# Patient Record
Sex: Male | Born: 1950 | Race: White | Hispanic: No | Smoking: Never smoker
Health system: Southern US, Community
[De-identification: ages and names within clinical notes are randomized; demographics above are authoritative.]

## PROBLEM LIST (undated history)

## (undated) DIAGNOSIS — R7989 Other specified abnormal findings of blood chemistry: Secondary | ICD-10-CM

## (undated) DIAGNOSIS — G4730 Sleep apnea, unspecified: Secondary | ICD-10-CM

## (undated) DIAGNOSIS — Z9889 Other specified postprocedural states: Secondary | ICD-10-CM

## (undated) DIAGNOSIS — R112 Nausea with vomiting, unspecified: Secondary | ICD-10-CM

## (undated) DIAGNOSIS — J40 Bronchitis, not specified as acute or chronic: Secondary | ICD-10-CM

## (undated) DIAGNOSIS — E119 Type 2 diabetes mellitus without complications: Secondary | ICD-10-CM

## (undated) DIAGNOSIS — R251 Tremor, unspecified: Secondary | ICD-10-CM

## (undated) DIAGNOSIS — E785 Hyperlipidemia, unspecified: Secondary | ICD-10-CM

## (undated) DIAGNOSIS — J449 Chronic obstructive pulmonary disease, unspecified: Secondary | ICD-10-CM

## (undated) DIAGNOSIS — I10 Essential (primary) hypertension: Secondary | ICD-10-CM

## (undated) DIAGNOSIS — I4891 Unspecified atrial fibrillation: Secondary | ICD-10-CM

## (undated) HISTORY — DX: Hyperlipidemia, unspecified: E78.5

## (undated) HISTORY — DX: Tremor, unspecified: R25.1

## (undated) HISTORY — DX: Morbid (severe) obesity due to excess calories: E66.01

## (undated) HISTORY — PX: HERNIA REPAIR: SHX51

## (undated) HISTORY — PX: JOINT REPLACEMENT: SHX530

## (undated) HISTORY — DX: Unspecified atrial fibrillation: I48.91

## (undated) HISTORY — PX: HYDROCELE EXCISION / REPAIR: SUR1145

## (undated) HISTORY — PX: ROTATOR CUFF REPAIR: SHX139

---

## 2003-04-27 ENCOUNTER — Encounter: Admission: RE | Admit: 2003-04-27 | Discharge: 2003-07-26 | Payer: Self-pay | Admitting: Internal Medicine

## 2005-12-14 ENCOUNTER — Emergency Department (HOSPITAL_COMMUNITY): Admission: EM | Admit: 2005-12-14 | Discharge: 2005-12-14 | Payer: Self-pay | Admitting: Emergency Medicine

## 2007-10-15 ENCOUNTER — Encounter: Admission: RE | Admit: 2007-10-15 | Discharge: 2007-10-15 | Payer: Self-pay | Admitting: Internal Medicine

## 2007-10-31 ENCOUNTER — Ambulatory Visit (HOSPITAL_COMMUNITY): Admission: RE | Admit: 2007-10-31 | Discharge: 2007-10-31 | Payer: Self-pay | Admitting: Cardiovascular Disease

## 2007-12-20 ENCOUNTER — Emergency Department (HOSPITAL_COMMUNITY): Admission: EM | Admit: 2007-12-20 | Discharge: 2007-12-20 | Payer: Self-pay | Admitting: General Surgery

## 2008-07-09 ENCOUNTER — Ambulatory Visit (HOSPITAL_COMMUNITY): Admission: RE | Admit: 2008-07-09 | Discharge: 2008-07-10 | Payer: Self-pay | Admitting: Orthopedic Surgery

## 2008-12-18 ENCOUNTER — Encounter: Admission: RE | Admit: 2008-12-18 | Discharge: 2008-12-18 | Payer: Self-pay | Admitting: Internal Medicine

## 2009-02-24 ENCOUNTER — Emergency Department (HOSPITAL_COMMUNITY): Admission: EM | Admit: 2009-02-24 | Discharge: 2009-02-24 | Payer: Self-pay | Admitting: Emergency Medicine

## 2009-11-02 ENCOUNTER — Inpatient Hospital Stay (HOSPITAL_COMMUNITY): Admission: RE | Admit: 2009-11-02 | Discharge: 2009-11-04 | Payer: Self-pay | Admitting: Orthopedic Surgery

## 2009-12-03 ENCOUNTER — Ambulatory Visit: Payer: Self-pay | Admitting: Vascular Surgery

## 2009-12-03 ENCOUNTER — Ambulatory Visit: Admission: RE | Admit: 2009-12-03 | Discharge: 2009-12-03 | Payer: Self-pay | Admitting: Orthopedic Surgery

## 2009-12-03 ENCOUNTER — Encounter (INDEPENDENT_AMBULATORY_CARE_PROVIDER_SITE_OTHER): Payer: Self-pay | Admitting: Orthopedic Surgery

## 2010-06-10 LAB — BASIC METABOLIC PANEL
BUN: 13 mg/dL (ref 6–23)
BUN: 18 mg/dL (ref 6–23)
Calcium: 8.7 mg/dL (ref 8.4–10.5)
Chloride: 102 mEq/L (ref 96–112)
GFR calc non Af Amer: >60 mL/min (ref >60)
GFR calc non Af Amer: >60 mL/min (ref >60)
Glucose, Bld: 130 mg/dL — ABNORMAL HIGH (ref 70–99)
Glucose, Bld: 148 mg/dL — ABNORMAL HIGH (ref 70–99)
Potassium: 4.5 mEq/L (ref 3.5–5.1)
Sodium: 133 mEq/L — ABNORMAL LOW (ref 135–145)
Sodium: 137 mEq/L (ref 135–145)

## 2010-06-10 LAB — GLUCOSE, CAPILLARY
Glucose-Capillary: 123 mg/dL — ABNORMAL HIGH (ref 70–99)
Glucose-Capillary: 125 mg/dL — ABNORMAL HIGH (ref 70–99)
Glucose-Capillary: 145 mg/dL — ABNORMAL HIGH (ref 70–99)
Glucose-Capillary: 151 mg/dL — ABNORMAL HIGH (ref 70–99)
Glucose-Capillary: 175 mg/dL — ABNORMAL HIGH (ref 70–99)

## 2010-06-10 LAB — PROTIME-INR
INR: 1.03 (ref 0.00–1.49)
INR: 1.18 (ref 0.00–1.49)
Prothrombin Time: 13.7 seconds (ref 11.6–15.2)

## 2010-06-10 LAB — CBC
HCT: 32.5 % — ABNORMAL LOW (ref 39.0–52.0)
Hemoglobin: 11.1 g/dL — ABNORMAL LOW (ref 13.0–17.0)
MCHC: 34.1 g/dL (ref 30.0–36.0)
MCHC: 34.5 g/dL (ref 30.0–36.0)
MCV: 89 fL (ref 78.0–100.0)
Platelets: 122 10*3/uL — ABNORMAL LOW (ref 150–400)
RDW: 13.2 % (ref 11.5–15.5)
WBC: 11.1 10*3/uL — ABNORMAL HIGH (ref 4.0–10.5)

## 2010-06-10 LAB — TYPE AND SCREEN: Antibody Screen: NEGATIVE

## 2010-06-10 LAB — ABO/RH: ABO/RH(D): O POS

## 2010-06-11 LAB — DIFFERENTIAL
Basophils Relative: 0 % (ref 0–1)
Eosinophils Absolute: 0.2 10*3/uL (ref 0.0–0.7)
Lymphs Abs: 1.6 10*3/uL (ref 0.7–4.0)
Monocytes Absolute: 0.6 10*3/uL (ref 0.1–1.0)
Monocytes Relative: 8 % (ref 3–12)

## 2010-06-11 LAB — URINALYSIS, ROUTINE W REFLEX MICROSCOPIC
Bilirubin Urine: NEGATIVE
Glucose, UA: NEGATIVE mg/dL
Hgb urine dipstick: NEGATIVE
Specific Gravity, Urine: 1.015 (ref 1.005–1.030)
Urobilinogen, UA: 0.2 mg/dL (ref 0.0–1.0)
pH: 5.5 (ref 5.0–8.0)

## 2010-06-11 LAB — BASIC METABOLIC PANEL
CO2: 27 mEq/L (ref 19–32)
Chloride: 108 mEq/L (ref 96–112)
Glucose, Bld: 116 mg/dL — ABNORMAL HIGH (ref 70–99)
Potassium: 4.4 mEq/L (ref 3.5–5.1)
Sodium: 139 mEq/L (ref 135–145)

## 2010-06-11 LAB — CBC
HCT: 41.8 % (ref 39.0–52.0)
Hemoglobin: 14.3 g/dL (ref 13.0–17.0)
MCH: 30.2 pg (ref 26.0–34.0)
MCHC: 34.2 g/dL (ref 30.0–36.0)
MCV: 88.4 fL (ref 78.0–100.0)
RBC: 4.73 MIL/uL (ref 4.22–5.81)

## 2010-06-11 LAB — SURGICAL PCR SCREEN: Staphylococcus aureus: NEGATIVE

## 2010-07-06 LAB — GLUCOSE, CAPILLARY
Glucose-Capillary: 148 mg/dL — ABNORMAL HIGH (ref 70–99)
Glucose-Capillary: 198 mg/dL — ABNORMAL HIGH (ref 70–99)

## 2010-07-06 LAB — CBC
HCT: 39.1 % (ref 39.0–52.0)
HCT: 46.1 % (ref 39.0–52.0)
Hemoglobin: 13.6 g/dL (ref 13.0–17.0)
Hemoglobin: 15.5 g/dL (ref 13.0–17.0)
MCHC: 33.7 g/dL (ref 30.0–36.0)
MCV: 88.1 fL (ref 78.0–100.0)
MCV: 89.1 fL (ref 78.0–100.0)
Platelets: 158 10*3/uL (ref 150–400)
RBC: 5.17 MIL/uL (ref 4.22–5.81)
WBC: 13.2 10*3/uL — ABNORMAL HIGH (ref 4.0–10.5)
WBC: 9.3 10*3/uL (ref 4.0–10.5)

## 2010-07-06 LAB — DIFFERENTIAL
Basophils Relative: 0 % (ref 0–1)
Eosinophils Absolute: 0.2 10*3/uL (ref 0.0–0.7)
Eosinophils Relative: 2 % (ref 0–5)
Lymphs Abs: 1.7 10*3/uL (ref 0.7–4.0)
Monocytes Absolute: 0.6 10*3/uL (ref 0.1–1.0)
Monocytes Relative: 7 % (ref 3–12)

## 2010-07-06 LAB — BASIC METABOLIC PANEL
BUN: 14 mg/dL (ref 6–23)
CO2: 25 mEq/L (ref 19–32)
Chloride: 98 mEq/L (ref 96–112)
Chloride: 104 mEq/L (ref 96–112)
Creatinine, Ser: 0.9 mg/dL (ref 0.4–1.5)
GFR calc Af Amer: >60 mL/min (ref >60)
GFR calc non Af Amer: >60 mL/min (ref >60)
Glucose, Bld: 135 mg/dL — ABNORMAL HIGH (ref 70–99)
Potassium: 4.5 mEq/L (ref 3.5–5.1)
Potassium: 4.5 mEq/L (ref 3.5–5.1)
Sodium: 136 mEq/L (ref 135–145)

## 2010-07-06 LAB — URINALYSIS, ROUTINE W REFLEX MICROSCOPIC
Bilirubin Urine: NEGATIVE
Glucose, UA: NEGATIVE mg/dL
Hgb urine dipstick: NEGATIVE
Specific Gravity, Urine: 1.023 (ref 1.005–1.030)
Urobilinogen, UA: 0.2 mg/dL (ref 0.0–1.0)
pH: 6 (ref 5.0–8.0)

## 2010-07-06 LAB — PROTIME-INR: INR: 1 (ref 0.00–1.49)

## 2010-08-09 NOTE — Discharge Summary (Signed)
NAMESHAYE, LAGACE                ACCOUNT NO.:  [id number]   MEDICAL RECORD NO.:  [id number]          PATIENT TYPE:  OIB   LOCATION:  5016                         FACILITY:  [facility]   PHYSICIAN:  Almedia Balls. Ranell Patrick, M.D. DATE OF BIRTH:  [birth year]   DATE OF ADMISSION:  07/09/2008  DATE OF DISCHARGE:  07/10/2008                               DISCHARGE SUMMARY   ADMITTING DIAGNOSES:  1. Right shoulder pain secondary to partial thickness subscap and      rotator cuff tear as well as chronic impingement and      acromioclavicular joint arthritis that was symptomatic.  2. Sleep apnea.  3. Morbid obesity.   DISCHARGE DIAGNOSES:  Right shoulder pain secondary to partial thickness  subscap and rotator cuff tear as well as chronic impingement and  acromioclavicular joint arthritis that was symptomatic.   PROCEDURE PERFORMED:  Right shoulder arthroscopy with extensive  intraarticular debridement of torn superior labrum anterior to  posterior, arthroscopic debridement of partial thickness subcapsularis  and rotator cuff tears, open disk clavicle excision, biceps tenotomy.  This was performed on July 09, 2008.   CONSULTING SERVICE:  Occupational Therapy.   HISTORY OF PRESENT ILLNESS:  The patient is a 60 year old male with  history of worsening right shoulder pain secondary to chronic  impingement with partial thickness with rotator cuff and suscap tears.  The patient also has symptomatic AC joint arthritis and SLAP lesion.  The patient has failed conservative management and now presents for  operative treatment.  For further details of the patient's past medical  history and physical examination please see the hospital record.   HOSPITAL COURSE:  The patient was admitted to orthopedics on July 09, 2008 where he underwent successful right shoulder arthroscopy.  Post  surgery, the patient was kept on O2.  He was placed on pulse oximetry  continuous and was set up with respiratory therapy  for CPAP.  The  patient was stable after surgery and was instructed on occupational  therapy exercises, was discharged on Percocet 7.5 as well as Robaxin.  He will be following up with this in the office in 7-10 days.  He will  be continuing his home based exercise program six times a day for 10  minutes at home, icing his shoulder, and propping a pillow between his  elbow.  All questions were encouraged and answered.  He was discharged  to home in stable condition.      Almedia Balls. Ranell Patrick, M.D.  Electronically Signed     SRN/MEDQ  D:  07/10/2008  T:  07/11/2008  Job:  [id number]

## 2010-08-09 NOTE — Op Note (Signed)
William Cameron, William Cameron                ACCOUNT NO.:  [id number]   MEDICAL RECORD NO.:  [id number]          PATIENT TYPE:  AMB   LOCATION:  SDS                          FACILITY:  [facility]   PHYSICIAN:  Almedia Balls. Ranell Patrick, M.D. DATE OF BIRTH:  08/09/1950   DATE OF PROCEDURE:  07/09/2008  DATE OF DISCHARGE:                               OPERATIVE REPORT   PREOPERATIVE DIAGNOSES:  Right shoulder rotator cuff tear, superior  labrum anterior and posterior lesion, acromioclavicular joint arthritis.   POSTOPERATIVE DIAGNOSES:  Right shoulder rotator cuff tear, partial  thickness superior labrum anterior and posterior lesion with torn biceps  as well as glenohumeral chondromalacia grade III/IV, and  acromioclavicular joint arthritis.   PROCEDURES PERFORMED:  1. Right shoulder arthroscopy, extensive intraarticular debridement of      torn superior labrum anterior to posterior, arthroscopic biceps      tenotomy.  2. Arthroscopic chondroplasty down to bleeding bone on the glenoid      side and chondroplasty on the humeral head side.  3. Debridement of labral tear at the inferior portion of shoulder.  4. Debridement of partial rotator cuff tear and partial thickness      subscapular tear.  5. Arthroscopic subacromial decompression with inspection of rotator      cuff in the bursal surface and open distal clavicle excision.   ATTENDING SURGEON:  Almedia Balls. Ranell Patrick, M.D.   ASSISTANT:  Donnie Coffin. Dixon, PA-C   ANESTHESIA:  General anesthesia was used, left interscalene block.   ESTIMATED BLOOD LOSS:  Minimal.   FLUIDS PLACEMENT:  1200 mL of crystalloid.   INSTRUMENT COUNT:  Correct.   COMPLICATIONS:  None.   Preoperative antibiotics were given.   INDICATION:  The patient is a 60 year old male with a history of  worsening right shoulder pain secondary to known partial thickness  rotator cuff tear, impingement, SLAP lesion, and AC arthritis.  The  patient has failed conservative management to  this point, presents  complaining of persistent pain in the shoulder, desiring operative  treatment to restore function and eliminate pain.  Informed consent was  obtained.   DESCRIPTION OF PROCEDURE:  After adequate level of anesthesia was  achieved, the patient was positioned in the modified beach chair  position.  The shoulder was examined under anesthesia.  The patient had  a little bit of stiffness with cross arm adduction, internal rotation  indicating entire posteroinferior capsule.  Following the sterile prep  and drape, we entered into the shoulder using standard portals including  anterior, posterior, and lateral portals.  The patient had severely torn  biceps and upper labrum.  This was debrided using basket forceps and  motorized shaver.  The biceps was tenotomized.  The glenoid had some  significant chondromalacia, posterior and inferior quadrant with loose  flaps of cartilage, we performed chondroplasty down the bleeding bone.  Anteroinferior labrum was also torn.  We debrided this using a motorized  shaver as well.  The subscapularis and a partial thickness tear limited  the upper surface.  We then performed a debridement of that using a  motorized shaver.  The remainder of the subscapularis is normal.  We did  a little bit of anterior-inferior capsular release due to some perceived  tightness at arthroscopy in the anterior-inferior glenohumeral ligament.  This rotator cuff had a partial thickness undersurface tear limited to  supraspinatus only.  We performed debridement of that tendon.  The  greater tuberosity footprint there, less than a 10% of thickness was  involved.  Infraspinatus posterior and teres minor was normal.  Following completion of chondroplasty, debridement of the rotator cuff  and subscapularis tendons as well as the release of the biceps tendon  and debridement of all torn labral tissue, we went ahead and placed the  scope in subacromial space and  performed a thorough bursectomy and  acromioplasty creating type I acromial shape with release of AC ligament  to complete the decompression of rotator cuff outlet.  The rotator cuff  appeared and felt normal within the bursa, so at this point we conclude  the arthroscopic portion of surgery.  I then went to the [facility] joint, we  performed a Saber incision overlying the [facility] joint, dissection down to  the subcutaneous tissues, deltotrapezial fascia identified and incised  in line with the distal clavicle.  Subperiosteal dissection of the  distal clavicle performed followed by the excision of distal 2-3 mm.  The patient already had some osteolysis in the joint and I did not want  to take too much of that clavicle.  A thorough decompression of the [facility]  joint was performed.  The surgeon placed his finger in the [facility] interval  and taken the shoulder through a full range of motion.  No impingement  was noted.  No instability of distal clavicle was noted.  Applied bone  wax to the cut and the clavicle thoroughly irrigated in the [facility] interval  and then repaired the deltotrapezial fascia with 0 Vicryl figure-of-  eight suture followed by 2-0 Vicryl for subcutaneous closure and 4-0  Monocryl for skin.  Steri-Strips were applied followed by a sterile  dressing.  The patient tolerated the surgery well.      Almedia Balls. Ranell Patrick, M.D.  Electronically Signed     SRN/MEDQ  D:  07/09/2008  T:  07/10/2008  Job:  [id number]

## 2010-12-26 LAB — DIFFERENTIAL
Basophils Absolute: 0
Basophils Relative: 0
Lymphocytes Relative: 14
Neutro Abs: 7.3
Neutrophils Relative %: 80 — ABNORMAL HIGH

## 2010-12-26 LAB — CBC
MCHC: 33.1
RBC: 5.02
RDW: 13.3

## 2010-12-26 LAB — BASIC METABOLIC PANEL
CO2: 25
Calcium: 9.7
Creatinine, Ser: 0.89
GFR calc Af Amer: >60
GFR calc non Af Amer: >60

## 2010-12-26 LAB — POCT CARDIAC MARKERS
CKMB, poc: <1 — ABNORMAL LOW
Troponin i, poc: <0.05

## 2012-01-22 ENCOUNTER — Other Ambulatory Visit: Payer: Self-pay | Admitting: Gastroenterology

## 2012-02-02 ENCOUNTER — Encounter (HOSPITAL_COMMUNITY)
Admission: RE | Disposition: A | Discharge status: Home / Self Care | Payer: Self-pay | Source: Ambulatory Visit | Attending: Gastroenterology

## 2012-02-02 ENCOUNTER — Ambulatory Visit (HOSPITAL_COMMUNITY)
Admission: RE | Admit: 2012-02-02 | Discharge: 2012-02-02 | Disposition: A | Discharge status: Home / Self Care | Payer: 59 | Source: Ambulatory Visit | Attending: Gastroenterology | Admitting: Gastroenterology

## 2012-02-02 ENCOUNTER — Encounter (HOSPITAL_COMMUNITY): Payer: Self-pay | Admitting: Gastroenterology

## 2012-02-02 DIAGNOSIS — K648 Other hemorrhoids: Secondary | ICD-10-CM | POA: Insufficient documentation

## 2012-02-02 DIAGNOSIS — Z1211 Encounter for screening for malignant neoplasm of colon: Secondary | ICD-10-CM | POA: Insufficient documentation

## 2012-02-02 DIAGNOSIS — K644 Residual hemorrhoidal skin tags: Secondary | ICD-10-CM | POA: Insufficient documentation

## 2012-02-02 HISTORY — DX: Other specified postprocedural states: Z98.890

## 2012-02-02 HISTORY — PX: COLONOSCOPY: SHX5424

## 2012-02-02 HISTORY — DX: Essential (primary) hypertension: I10

## 2012-02-02 HISTORY — DX: Type 2 diabetes mellitus without complications: E11.9

## 2012-02-02 HISTORY — DX: Chronic obstructive pulmonary disease, unspecified: J44.9

## 2012-02-02 HISTORY — DX: Other specified postprocedural states: R11.2

## 2012-02-02 SURGERY — COLONOSCOPY
Anesthesia: Moderate Sedation

## 2012-02-02 MED ORDER — SODIUM CHLORIDE 0.9 % IV SOLN: INTRAVENOUS | Status: DC

## 2012-02-02 MED ORDER — MIDAZOLAM HCL 5 MG/5ML IJ SOLN
INTRAMUSCULAR | Status: DC | PRN
  Administered 2012-02-02: 2 mg via INTRAVENOUS
  Administered 2012-02-02: 1 mg via INTRAVENOUS
  Administered 2012-02-02: 2 mg via INTRAVENOUS

## 2012-02-02 MED ORDER — FENTANYL CITRATE 0.05 MG/ML IJ SOLN
INTRAMUSCULAR | Status: AC
  Filled 2012-02-02: qty 2

## 2012-02-02 MED ORDER — FENTANYL CITRATE 0.05 MG/ML IJ SOLN
INTRAMUSCULAR | Status: DC | PRN
  Administered 2012-02-02 (×2): 25 ug via INTRAVENOUS

## 2012-02-02 MED ORDER — MIDAZOLAM HCL 10 MG/2ML IJ SOLN
INTRAMUSCULAR | Status: AC
  Filled 2012-02-02: qty 2

## 2012-02-02 MED ORDER — DIPHENHYDRAMINE HCL 50 MG/ML IJ SOLN
INTRAMUSCULAR | Status: AC
  Filled 2012-02-02: qty 1

## 2012-02-02 NOTE — Op Note (Signed)
[facility] [address] [place], [address]   OPERATIVE PROCEDURE REPORT  PATIENT: William Cameron, William Cameron  MR#: [id number] BIRTHDATE: 1950-11-03  GENDER: Male ENDOSCOPIST: Jeani Hawking, MD ASSISTANT:   Karie Soda, technician Janae Sauce, RN PROCEDURE DATE: 02/02/2012 PROCEDURE:   Colonoscopy, diagnostic ASA CLASS:   Class III INDICATIONS:average risk patient for colon cancer. MEDICATIONS: Versed 5 mg IV and Fentanyl 50 mcg IV  DESCRIPTION OF PROCEDURE:   After the risks benefits and alternatives of the procedure were thoroughly explained, informed consent was obtained.  A digital rectal exam revealed no abnormalities of the rectum.    The Pentax Colonoscope [id number] endoscope was introduced through the anus  and advanced to the cecum, which was identified by both the appendix and ileocecal valve , No adverse events experienced.    The quality of the prep was excellent. .  The instrument was then slowly withdrawn as the colon was fully examined.     FINDINGS: A normal appearing cecum, ileocecal valve, and appendiceal orifice were identified.  The ascending, hepatic flexure, transverse, splenic flexure, descending, sigmoid colon and rectum appeared unremarkable.  No polyps or cancers were seen. Retroflexed views revealed internal/external hemorrhoids.     The scope was then withdrawn from the patient and the procedure terminated.  COMPLICATIONS: There were no complications.  IMPRESSION: 1) Normal colon  RECOMMENDATIONS: 1) Repeat colonoscopy in 10 years.   _______________________________ eSignedJeani Hawking, MD 02/02/2012 9:40 AM

## 2012-02-02 NOTE — H&P (Signed)
  Reason for Consult: Screening colonoscopy. Referring Physician: Ralene Ok, M.D.  Shirlean Mylar HPI: This is a 61 year old male referred for a screening colonoscopy.  No complaints of nausea, vomiting, diarrhea, constipation, hematochezia, melena, GERD, or dysphagia.  No known family history of colon cancer and no reports of chest pain, SOB, or MI.  His prior screening colonoscopy greater than 10 years ago was negative.  History reviewed. No pertinent past medical history.  History reviewed. No pertinent past surgical history.  History reviewed. No pertinent family history.  Social History:  does not have a smoking history on file. He does not have any smokeless tobacco history on file. His alcohol and drug histories not on file.  Allergies:  Allergies  Allergen Reactions  . Penicillins Anaphylaxis  . Tetanus Toxoids Swelling    Can't breathe  . Sulfa Antibiotics Swelling    Swelling and nausea    Medications:  Scheduled:  Continuous:   . sodium chloride      No results found for this or any previous visit (from the past 24 hour(s)).   No results found.  ROS:  As stated above in the HPI otherwise negative.  Blood pressure 154/89, temperature 98 F (36.7 C), temperature source Oral, resp. rate 12, height 6' (1.829 m), weight 179.624 kg (396 lb), SpO2 95.00%.    PE: Gen: NAD, Alert and Oriented HEENT:  [place]/AT, EOMI Neck: Supple, no LAD Lungs: CTA Bilaterally CV: RRR without M/G/R ABM: Soft, NTND, +BS Ext: No C/C/E  Assessment/Plan: 1) Screening colonoscopy.  Plan: 1) Colonoscopy.  Verlinda Slotnick D 02/02/2012, 8:49 AM

## 2012-02-05 ENCOUNTER — Encounter (HOSPITAL_COMMUNITY): Payer: Self-pay | Admitting: Gastroenterology

## 2013-05-02 ENCOUNTER — Other Ambulatory Visit: Payer: Self-pay | Admitting: Orthopedic Surgery

## 2013-05-02 DIAGNOSIS — M545 Low back pain, unspecified: Secondary | ICD-10-CM

## 2013-05-10 ENCOUNTER — Ambulatory Visit
Admission: RE | Admit: 2013-05-10 | Discharge: 2013-05-10 | Disposition: A | Discharge status: Home / Self Care | Payer: 59 | Source: Ambulatory Visit | Attending: Orthopedic Surgery | Admitting: Orthopedic Surgery

## 2013-05-10 DIAGNOSIS — M545 Low back pain, unspecified: Secondary | ICD-10-CM

## 2013-09-01 ENCOUNTER — Institutional Professional Consult (permissible substitution): Payer: Self-pay | Admitting: Neurology

## 2013-09-03 ENCOUNTER — Ambulatory Visit (INDEPENDENT_AMBULATORY_CARE_PROVIDER_SITE_OTHER): Payer: 59 | Admitting: Neurology

## 2013-09-03 ENCOUNTER — Encounter (INDEPENDENT_AMBULATORY_CARE_PROVIDER_SITE_OTHER): Payer: Self-pay

## 2013-09-03 ENCOUNTER — Encounter: Payer: Self-pay | Admitting: Neurology

## 2013-09-03 VITALS — BP 121/74 | HR 65 | Resp 19 | Ht 72.0 in | Wt 355.0 lb

## 2013-09-03 DIAGNOSIS — R0683 Snoring: Secondary | ICD-10-CM

## 2013-09-03 DIAGNOSIS — E662 Morbid (severe) obesity with alveolar hypoventilation: Secondary | ICD-10-CM

## 2013-09-03 DIAGNOSIS — R0989 Other specified symptoms and signs involving the circulatory and respiratory systems: Secondary | ICD-10-CM

## 2013-09-03 DIAGNOSIS — R0609 Other forms of dyspnea: Secondary | ICD-10-CM

## 2013-09-03 HISTORY — DX: Morbid (severe) obesity due to excess calories: E66.01

## 2013-09-03 NOTE — Patient Instructions (Signed)

## 2013-09-03 NOTE — Progress Notes (Signed)
[name]  Provider:  [name], M D  Referring Provider: Ralene Ok, MD Primary Care Physician:  Ralene Ok, MD  Chief Complaint  Patient presents with  . New Evaluation    Room 11  . Sleep consult    HPI:  William Cameron is a 63 y.o. caucasian , married , right handed male , who is seen here as a referral from Dr. 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     The patient reports that he had recently his usual physical evacuation with Dr. Clement Sayres and that he was given a clean bill of health. However when he had to go to his occupational related exam his neck circumference was measured and exceeded over 17 inches - thus he was considered at high risk for sleep apnea.  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 He does have irregular work hours. 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 His daily limits can be up to 600 miles.  He has a history of diabetes mellitus type 2 and high cholesterol he also has hypertension which is being considered well controlled.  He had carried a diagnoses of coronary artery disease after cardiac cath no intervention was applied. He has never been a smoker of there is no COPD or emphysema history. He does not remarkable and she denies using any illicit drugs.   He stated that he does not have a "sleeping  Problem", nor wheezing or coughing no heart murmur dyspnea or orthopnea. He denies any chest pains. He does have nocturia. Is that he has normal appetite. No headaches no dizziness and no fainting spells.   His wife does note some snoring, but has not witnessed apnea. He has 3-4 nocturia spells a night. Between him and his wife sleeps their Boxer dog. His bedroom is cool , quiet and dark,  air conditioning , fan running. He prefers 9 PM as his bedtime.  He watches TV in the living room, transfers to bed, falls asleep promptly -he wakes up at 5.30 every morning of the week. No alarm needed. He rarely naps in daytime.  He has coffee in the morning.  He may nap after a night work shift- prefers to sleep in a recliner.   He has no family history of a sleep disorder. He had a tonsillectomy and adenoidectomy in childhood, 77. 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    Review of Systems: Out of a complete 14 system review, the patient complains of only the following symptoms, and all other reviewed systems are negative. Epworth 3, FSS 12,   History   Social History  . Marital Status: Married    Spouse Name: [name]    Number of Children: 1  . Years of Education: 12+   Occupational History  .     Social History Main Topics  . Smoking status: Never Smoker   . Smokeless tobacco: Never Used  . Alcohol Use: No  . Drug Use: No  . Sexual Activity:    Other Topics Concern  . Not on file   Social History Narrative   Patient is married [name]) and lives at home with his wife.   Patient has one adult child.  Patient is working full-time.   Patient has a high school and auto classes.   Patient is right-handed.   Patient drinks two cups of coffee every morning and very little tea.    Family History  Problem Relation Age of Onset  . Prostate cancer Father 73  . Diabetes type II Mother     Past Medical History  Diagnosis Date  . Diabetes mellitus without complication   . PONV (postoperative nausea and vomiting)   . Hypertension   . COPD (chronic obstructive pulmonary disease)   . 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     Past Surgical History  Procedure Laterality Date  . Hernia repair    . Joint replacement      rt knee  . Hydrocele excision / repair    .  Rotator cuff repair  rt  . Colonoscopy  02/02/2012    Procedure: COLONOSCOPY;  Surgeon: Theda Belfast, MD;  Location: [facility];  Service: Endoscopy;  Laterality: N/A;    Current Outpatient Prescriptions  Medication Sig Dispense Refill  . amLODipine (NORVASC) 5 MG tablet Take 5 mg by mouth daily.      . benazepril (LOTENSIN) 40 MG tablet Take 40 mg by mouth daily.      . diclofenac (VOLTAREN) 75 MG EC tablet 1 tablet 2 (two) times daily.      Marland Kitchen FARXIGA 5 MG TABS 1 tablet daily.      Marland Kitchen gabapentin (NEURONTIN) 300 MG capsule 2 capsules 2 (two) times daily.      . metFORMIN (GLUCOPHAGE) 1000 MG tablet Take 1,000 mg by mouth 2 (two) times daily with a meal.       No current facility-administered medications for this visit.    Allergies as of 09/03/2013 - Review Complete 09/03/2013  Allergen Reaction Noted  . Penicillins Anaphylaxis 02/02/2012  . Tetanus toxoids Swelling 02/02/2012  . Sulfa antibiotics Swelling 02/02/2012    Vitals: BP 121/74  Pulse 65  Resp 19  Ht 6' (1.829 m)  Wt 355 lb (161.027 kg)  BMI 48.14 kg/m2 Last Weight:  Wt Readings from Last 1 Encounters:  09/03/13 355 lb (161.027 kg)   Last Height:   Ht Readings from Last 1 Encounters:  09/03/13 6' (1.829 m)    Physical exam:  General: The patient is awake, alert and appears not in acute distress. Head: Normocephalic, atraumatic. Neck is supple. Mallampati 4, neck circumference: 20.25 - poor dentition.  Cardiovascular:  Regular rate and rhythm , without  murmurs or carotid bruit, and without distended neck veins. Respiratory: Lungs are clear to auscultation. Skin:  Without evidence of edema, or rash Trunk: BMI is highly elevated, The  patient  has normal posture. abdominal obesity  Neurologic exam : The patient is awake and alert, oriented to place and time.  Memory subjective described as intact. There is a normal attention span & concentration ability.  Speech is fluent without  dysarthria, dysphonia or  aphasia. Mood and affect are appropriate.  Cranial nerves: Pupils are equal and briskly reactive to light. Funduscopic exam without  evidence of pallor or edema.  Extraocular movements  in vertical and horizontal planes intact and without nystagmus. Visual fields by finger perimetry are intact. Hearing to finger rub intact.  Facial sensation intact to fine touch. Facial motor strength is symmetric and tongue and uvula move midline.  Motor exam:  Normal tone ,muscle bulk and symmetric normal strength in all extremities.  Sensory:  Fine touch, pinprick and vibration were  normal.  Coordination: Rapid alternating movements in the fingers/hands is tested and normal. Finger-to-nose maneuver tested and normal without evidence of ataxia, dysmetria or tremor.  Gait and station:  Waddling gait, wide based. Patient walks without assistive device. Deep tendon reflexes: in the  upper and lower extremities are symmetric and intact. Babinski maneuver response is   downgoing.   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 [place] .   Plan:  Treatment plan and additional workup : STAT schedule for SPLIT. With Co2 ,  AHI 20 and score at 4%

## 2013-09-06 ENCOUNTER — Ambulatory Visit (INDEPENDENT_AMBULATORY_CARE_PROVIDER_SITE_OTHER): Payer: 59 | Admitting: Neurology

## 2013-09-06 DIAGNOSIS — E662 Morbid (severe) obesity with alveolar hypoventilation: Secondary | ICD-10-CM

## 2013-09-06 DIAGNOSIS — G4733 Obstructive sleep apnea (adult) (pediatric): Secondary | ICD-10-CM

## 2013-09-06 DIAGNOSIS — R0683 Snoring: Secondary | ICD-10-CM

## 2013-09-11 ENCOUNTER — Telehealth: Payer: Self-pay | Admitting: Neurology

## 2013-09-11 ENCOUNTER — Other Ambulatory Visit: Payer: Self-pay | Admitting: *Deleted

## 2013-09-11 DIAGNOSIS — G4733 Obstructive sleep apnea (adult) (pediatric): Secondary | ICD-10-CM

## 2013-09-11 NOTE — Telephone Encounter (Signed)
I called and spoke with the patient about his recent sleep study results. I informed the patient that the study revealed obstructive sleep apnea and that he did well on CPAP during the night of his study with significant improvement in his respiratory events. Dr. Vickey Hugerohmeier recommends starting CPAP therapy at home. I will send the order to [facility] who will contact the patient. I will fax a copy to Dr. Gardenia Phlegmoy Moreira's office and mail a copy to the patient along with a follow up instruction letter.

## 2013-09-12 ENCOUNTER — Encounter: Payer: Self-pay | Admitting: *Deleted

## 2013-09-29 ENCOUNTER — Encounter: Payer: Self-pay | Admitting: Neurology

## 2013-10-21 ENCOUNTER — Encounter: Payer: Self-pay | Admitting: Neurology

## 2013-10-21 ENCOUNTER — Ambulatory Visit (INDEPENDENT_AMBULATORY_CARE_PROVIDER_SITE_OTHER): Payer: 59 | Admitting: Neurology

## 2013-10-21 VITALS — BP 123/81 | HR 63 | Resp 16 | Ht 72.0 in | Wt 357.0 lb

## 2013-10-21 DIAGNOSIS — G4730 Sleep apnea, unspecified: Secondary | ICD-10-CM

## 2013-10-21 DIAGNOSIS — G4733 Obstructive sleep apnea (adult) (pediatric): Secondary | ICD-10-CM | POA: Insufficient documentation

## 2013-10-21 DIAGNOSIS — Z9989 Dependence on other enabling machines and devices: Principal | ICD-10-CM

## 2013-10-21 DIAGNOSIS — E669 Obesity, unspecified: Secondary | ICD-10-CM

## 2013-10-21 NOTE — Patient Instructions (Signed)
Medical Examiner's Certificate  I certify that I have examined William MylarMichael E Cameron for OSA and diagnosed this condition and initiated treatment. In accordance with the [employer] Safety Regulations  and with knowledge of the driving duties, I find this person is able to return to driving without restrictions.  CPAP was initiated and compliance is between 97 to 100% . The information I have provided regarding this physical examination is true and complete  A complete form and notes my findings completely and correctly, and is on file in my office.   _________________________________________ [facility] IncDOHMEIER,William Cameron 10/21/2013  __________________________________________ _______________________ _____  Signature of Driver Driver's License No. State    Address of Driver [id number] [place] [id number]

## 2013-10-21 NOTE — Progress Notes (Signed)
[name]  Provider:  [name], M D  Referring Provider: Ralene Ok, MD Primary Care Physician:  Ralene Ok, MD  Chief Complaint  Patient presents with  . Follow-up    Room 11  . Sleep Apnea    HPI:  William Cameron is a 63 y.o. caucasian , married , right handed male , who is seen here as a referral from Dr. 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   The guns underwent a sleep study on 09-07-13 based on his neck circumference of 20 inches and is BMI of 48.1 his blood pressure prestudy was normal. However the sleep study revealed an AHI of 49.9 RDI of 63.2 and the patient slept not even 1 minute at night on his back these apnea in the please were obtained by the patient slept on his side and prone .  The lowest oxygen saturation was 80% was 55.9 minutes of desaturation time in total. Was critical hypoxemia. The patient also had some PACs and PVCs during his sleep study,  while he was titrated to 11 cm water.  We obtained a download through [facility] , his DME , auto-titrated between 8 and 14 cm water:  the patient's 95th percentile is 13 cm water his AHI is 1.0. He had 100% compliance his average user time was 4 hours and 44 minutes at night.  We obtained a second in office download dated 10-21-13, 97% compliance 4 hours and 44 minutes 95th percentile of pressure was 12.8 cm pressure. He has now been using CPAP over 30 days with high compliance and feels improvement. Epworth 1 .       Last visit notes:  The patient reports that he had recently his usual physical evacuation with Dr. Clement Sayres and that he was given a clean bill of health. However when he had to go to his occupational related exam his neck circumference was measured and exceeded over 17 inches - thus he was considered at high risk for sleep apnea.  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 He does have irregular work hours. 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 His daily limits can be up to 600 miles.  He has a history of diabetes mellitus type 2 and high cholesterol he also has hypertension which is being considered well controlled.  He had carried a diagnoses of coronary artery disease after cardiac cath no intervention was applied. He has never been a smoker of there is no COPD or emphysema history. He does not remarkable and she denies using any illicit drugs.  He stated that he does not have a "sleeping  Problem", nor wheezing or coughing no heart murmur dyspnea or orthopnea. He denies any chest pains. He does have nocturia. Is that he has normal appetite. No headaches no dizziness and no fainting spells. His wife does note some snoring, but has not witnessed apnea. He has 3-4 nocturia spells a night. Between him and his wife sleeps their Boxer dog. His bedroom is cool , quiet and dark, air conditioning , fan running. He prefers 9 PM as his bedtime.  He watches TV in the living room, transfers to bed, falls asleep promptly -he wakes up at 5.30  every morning of the week. No alarm needed. He rarely naps in daytime.  He has coffee in the morning. He may nap after a night work shift- prefers to sleep in a recliner.   He has no family history of a sleep disorder. He had a tonsillectomy and adenoidectomy in childhood, 45. 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    Review of Systems: Out of a complete 14 system review, the patient complains of only the following symptoms, and all other reviewed systems are negative. Epworth 2, FSS 10, he didn't repeat the GDS score.    History   Social History  . Marital Status: Married    Spouse Name: William Cameron    Number of Children: 1  . Years of Education: 12+   Occupational History  .     Social History Main Topics  . Smoking  status: Never Smoker   . Smokeless tobacco: Never Used  . Alcohol Use: No  . Drug Use: No  . Sexual Activity:    Other Topics Concern  . Not on file   Social History Narrative   Patient is married William Cameron) and lives at home with his wife.   Patient has one adult child.   Patient is working full-time.   Patient has a high school and auto classes.   Patient is right-handed.   Patient drinks two cups of coffee every morning and very little tea.    Family History  Problem Relation Age of Onset  . Prostate cancer Father 19  . Diabetes type II Mother     Past Medical History  Diagnosis Date  . Diabetes mellitus without complication   . PONV (postoperative nausea and vomiting)   . Hypertension   . COPD (chronic obstructive pulmonary disease)   . 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     Past Surgical History  Procedure Laterality Date  . Hernia repair    . Joint replacement      rt knee  . Hydrocele excision / repair    . Rotator cuff repair  rt  . Colonoscopy  02/02/2012    Procedure: COLONOSCOPY;  Surgeon: Theda Belfast, MD;  Location: [facility];  Service: Endoscopy;  Laterality: N/A;    Current Outpatient Prescriptions  Medication Sig Dispense Refill  . amLODipine (NORVASC) 5 MG tablet Take 5 mg by mouth daily.      . benazepril (LOTENSIN) 40 MG tablet Take 40 mg by mouth daily.      . diclofenac (VOLTAREN) 75 MG EC tablet 1 tablet 2 (two) times daily.      Marland Kitchen FARXIGA 5 MG TABS 1 tablet daily.      Marland Kitchen gabapentin (NEURONTIN) 300 MG capsule 2 capsules 2 (two) times daily.      . metFORMIN (GLUCOPHAGE) 1000 MG tablet Take 1,000 mg by mouth 2 (two) times daily with a meal.       No current facility-administered medications for this visit.    Allergies as of 10/21/2013 - Review Complete 10/21/2013  Allergen Reaction Noted  . Penicillins Anaphylaxis  02/02/2012  . Tetanus toxoids Swelling 02/02/2012  . Sulfa antibiotics Swelling 02/02/2012    Vitals: BP 123/81  Pulse 63  Resp 16  Ht 6' (1.829 m)  Wt 357 lb (161.934 kg)  BMI 48.41 kg/m2 Last Weight:  Wt Readings from Last 1 Encounters:  10/21/13 357 lb (161.934 kg)  Last Height:   Ht Readings from Last 1 Encounters:  10/21/13 6' (1.829 m)    Physical exam:  General: The patient is awake, alert and appears not in acute distress. Head: Normocephalic, atraumatic. Neck is supple. Mallampati 4, neck circumference: 20.25 - poor dentition.  Cardiovascular:  Regular rate and rhythm , without  murmurs or carotid bruit, and without distended neck veins. Respiratory: Lungs are clear to auscultation. Skin:  Without evidence of edema, or rash Trunk: BMI is highly elevated, The  patient  has normal posture. abdominal obesity  Neurologic exam : The patient is awake and alert, oriented to place and time.  Memory subjective described as intact. There is a normal attention span & concentration ability.  Speech is fluent without  dysarthria, dysphonia or aphasia. Mood and affect are appropriate.  Cranial nerves: Pupils are equal and briskly reactive to light. Hearing to finger rub intact.  Facial sensation intact to fine touch. Facial motor strength is symmetric and tongue and uvula move midline. Motor exam:   Normal tone ,muscle bulk and symmetric normal strength in all extremities. Gait and station:  Waddling gait, wide based. Patient walks without assistive device. Deep tendon reflexes: in the upper and lower extremities are symmetric and intact. Babinski maneuver response is   downgoing.  Assessment:  After physical and neurologic examination, review of laboratory studies, imaging, neurophysiology testing and pre-existing records, assessment is :  DOT driver with severe OSA, and hypoxemia-  Patient responded excellent to CPAP at 13 cm water, by 955 pressure of auto titration. ONO .

## 2013-11-21 ENCOUNTER — Encounter: Payer: Self-pay | Admitting: Neurology

## 2013-11-27 ENCOUNTER — Telehealth: Payer: Self-pay | Admitting: Neurology

## 2013-11-27 DIAGNOSIS — R0902 Hypoxemia: Secondary | ICD-10-CM

## 2013-11-27 DIAGNOSIS — E662 Morbid (severe) obesity with alveolar hypoventilation: Secondary | ICD-10-CM

## 2013-12-04 ENCOUNTER — Other Ambulatory Visit: Payer: Self-pay | Admitting: Neurology

## 2013-12-04 DIAGNOSIS — E662 Morbid (severe) obesity with alveolar hypoventilation: Secondary | ICD-10-CM

## 2013-12-04 DIAGNOSIS — G4733 Obstructive sleep apnea (adult) (pediatric): Secondary | ICD-10-CM

## 2013-12-10 ENCOUNTER — Encounter: Payer: Self-pay | Admitting: *Deleted

## 2013-12-10 NOTE — Telephone Encounter (Signed)
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 He was concerned about the order of oxygen at night, and if this changed his drivers licence requirements.  His employer pays for the DOT related sleep test and treatment and needs a letter explaining the need for him and related to his job.  C. Mekia Dipinto    Phone for Mr Nochum Fenter . Letter to [facility]. Mrs; [place]     [name] on 12-10-13,  To whom it may concern,  My patient [name] , born on Sep 29, 1950,  tested positive for sleep apnea and is currently treated successfully with CPAP.   Due to finding prolonged periods of low oxygen levels during his sleep study,  a followup test was performed. The patient underwent an overnight pulse oximetry - while his apnea was  treated with CPAP at night at home.  This test revealed over 30 minutes at 89% oxygen saturation or below 89% and is therefor abnormal.  The test result was " hypoxemia while treated with CPAP" . Oxygen supplementation is ordered to correct these low levels, and will be only used during CPAP use, as both machines deliver air-pressure through the CPAP mask.   This treatment is essential,  allowing Mr. Polinsky to be alert and non fatigued while working.    Please contact me if any additional questions arise,   Sincerely [employer]. MD

## 2013-12-10 NOTE — Telephone Encounter (Signed)
Letter was printed and to be mailed to the patient.

## 2013-12-16 ENCOUNTER — Encounter: Payer: Self-pay | Admitting: Neurology

## 2014-06-30 NOTE — Progress Notes (Signed)
Please put orders in Epic surgery 07-14-14 pre op 07-08-14 Thanks 

## 2014-07-07 NOTE — Pre-Procedure Instructions (Deleted)
20 William Cameron  07/07/2014    Your procedure is scheduled on:   07-14-2014 Tuesday  [facility] and follow signs to [facility]. Arrive at     0830   AM.  Call this number if you have problems the morning of surgery: [phone]  Or Presurgical Testing [phone].   For Living Will and/or Health Care Power Attorney Forms: please provide copy for your medical record,may bring AM of surgery(Forms should be already notarized -we do not provide this service).(07-08-14 Yes/ No information preferred today).   For Cpap use: Bring mask and tubing only.   Do not eat food/ or drink: After Midnight.      Take these medicines the morning of surgery with A SIP OF WATER: Amlodipine. Fenofibrate. Gabapentin. No diabetic meds AM of.   Do not wear jewelry, make-up or nail polish.  Do not wear deodorant, lotions, powders, or perfumes.   Do not shave legs and under arms- 48 hours(2 days) prior to first CHG shower.(Shaving face and neck okay.)  Do not bring valuables to the hospital.(Hospital is not responsible for lost valuables).  Contacts, dentures or removable bridgework, body piercing, hair pins may not be worn into surgery.  Leave suitcase in the car. After surgery it may be brought to your room.  For patients admitted to the hospital, checkout time is 11:00 AM the day of discharge.(Restricted visitors-Any Persons displaying flu-like symptoms or illness).    Patients discharged the day of surgery will not be allowed to drive home. Must have responsible person with you x 24 hours once discharged.  Name and phone number of your driver:      Please read over the following fact sheets that you were given:  CHG(Chlorhexidine Gluconate 4% Surgical Soap) use, MRSA Information, Blood Transfusion fact sheet, Incentive Spirometry Instruction.  Remember : Type/Screen "Blue armbands" - may not be removed once applied(would result in being retested AM of surgery, if  removed).         [facility] - Preparing for Surgery Before surgery, you can play an important role.  Because skin is not sterile, your skin needs to be as free of germs as possible.  You can reduce the number of germs on your skin by washing with CHG (chlorahexidine gluconate) soap before surgery.  CHG is an antiseptic cleaner which kills germs and bonds with the skin to continue killing germs even after washing. Please DO NOT use if you have an allergy to CHG or antibacterial soaps.  If your skin becomes reddened/irritated stop using the CHG and inform your nurse when you arrive at Short Stay. Do not shave (including legs and underarms) for at least 48 hours prior to the first CHG shower.  You may shave your face/neck. Please follow these instructions carefully:  1.  Shower with CHG Soap the night before surgery and the  morning of Surgery.  2.  If you choose to wash your hair, wash your hair first as usual with your  normal  shampoo.  3.  After you shampoo, rinse your hair and body thoroughly to remove the  shampoo.                           4.  Use CHG as you would any other liquid soap.  You can apply chg directly  to the skin and wash  Gently with a scrungie or clean washcloth.  5.  Apply the CHG Soap to your body ONLY FROM THE NECK DOWN.   Do not use on face/ open                           Wound or open sores. Avoid contact with eyes, ears mouth and genitals (private parts).                       Wash face,  Genitals (private parts) with your normal soap.             6.  Wash thoroughly, paying special attention to the area where your surgery  will be performed.  7.  Thoroughly rinse your body with warm water from the neck down.  8.  DO NOT shower/wash with your normal soap after using and rinsing off  the CHG Soap.                9.  Pat yourself dry with a clean towel.            10.  Wear clean pajamas.            11.  Place clean sheets on your bed the night of  your first shower and do not  sleep with pets. Day of Surgery : Do not apply any lotions/deodorants the morning of surgery.  Please wear clean clothes to the hospital/surgery center.  FAILURE TO FOLLOW THESE INSTRUCTIONS MAY RESULT IN THE CANCELLATION OF YOUR SURGERY PATIENT SIGNATURE_________________________________  NURSE SIGNATURE__________________________________  ________________________________________________________________________   [name]  An incentive spirometer is a tool that can help keep your lungs clear and active. This tool measures how well you are filling your lungs with each breath. Taking long deep breaths may help reverse or decrease the chance of developing breathing (pulmonary) problems (especially infection) following:  A long period of time when you are unable to move or be active. BEFORE THE PROCEDURE   If the spirometer includes an indicator to show your best effort, your nurse or respiratory therapist will set it to a desired goal.  If possible, sit up straight or lean slightly forward. Try not to slouch.  Hold the incentive spirometer in an upright position. INSTRUCTIONS FOR USE   Sit on the edge of your bed if possible, or sit up as far as you can in bed or on a chair.  Hold the incentive spirometer in an upright position.  Breathe out normally.  Place the mouthpiece in your mouth and seal your lips tightly around it.  Breathe in slowly and as deeply as possible, raising the piston or the ball toward the top of the column.  Hold your breath for 3-5 seconds or for as long as possible. Allow the piston or ball to fall to the bottom of the column.  Remove the mouthpiece from your mouth and breathe out normally.  Rest for a few seconds and repeat Steps 1 through 7 at least 10 times every 1-2 hours when you are awake. Take your time and take a few normal breaths between deep breaths.  The spirometer may include an indicator to show your  best effort. Use the indicator as a goal to work toward during each repetition.  After each set of 10 deep breaths, practice coughing to be sure your lungs are clear. If you have an incision (the cut made at the time of  surgery), support your incision when coughing by placing a pillow or rolled up towels firmly against it. Once you are able to get out of bed, walk around indoors and cough well. You may stop using the incentive spirometer when instructed by your caregiver.  RISKS AND COMPLICATIONS  Take your time so you do not get dizzy or light-headed.  If you are in pain, you may need to take or ask for pain medication before doing incentive spirometry. It is harder to take a deep breath if you are having pain. AFTER USE  Rest and breathe slowly and easily.  It can be helpful to keep track of a log of your progress. Your caregiver can provide you with a simple table to help with this. If you are using the spirometer at home, follow these instructions: [place] IF:   You are having difficultly using the spirometer.  You have trouble using the spirometer as often as instructed.  Your pain medication is not giving enough relief while using the spirometer.  You develop fever of 100.5 F (38.1 C) or higher. SEEK IMMEDIATE MEDICAL CARE IF:   You cough up bloody sputum that had not been present before.  You develop fever of 102 F (38.9 C) or greater.  You develop worsening pain at or near the incision site. MAKE SURE YOU:   Understand these instructions.  Will watch your condition.  Will get help right away if you are not doing well or get worse. Document Released: 07/24/2006 Document Revised: 06/05/2011 Document Reviewed: 09/24/2006 ExitCare Patient Information 2014 ExitCare, [place].   ________________________________________________________________________  WHAT IS A BLOOD TRANSFUSION? Blood Transfusion Information  A transfusion is the replacement of blood or  some of its parts. Blood is made up of multiple cells which provide different functions.  Red blood cells carry oxygen and are used for blood loss replacement.  White blood cells fight against infection.  Platelets control bleeding.  Plasma helps clot blood.  Other blood products are available for specialized needs, such as hemophilia or other clotting disorders. BEFORE THE TRANSFUSION  Who gives blood for transfusions?   Healthy volunteers who are fully evaluated to make sure their blood is safe. This is blood bank blood. Transfusion therapy is the safest it has ever been in the practice of medicine. Before blood is taken from a donor, a complete history is taken to make sure that person has no history of diseases nor engages in risky social behavior (examples are intravenous drug use or sexual activity with multiple partners). The donor's travel history is screened to minimize risk of transmitting infections, such as malaria. The donated blood is tested for signs of infectious diseases, such as HIV and hepatitis. The blood is then tested to be sure it is compatible with you in order to minimize the chance of a transfusion reaction. If you or a relative donates blood, this is often done in anticipation of surgery and is not appropriate for emergency situations. It takes many days to process the donated blood. RISKS AND COMPLICATIONS Although transfusion therapy is very safe and saves many lives, the main dangers of transfusion include:   Getting an infectious disease.  Developing a transfusion reaction. This is an allergic reaction to something in the blood you were given. Every precaution is taken to prevent this. The decision to have a blood transfusion has been considered carefully by your caregiver before blood is given. Blood is not given unless the benefits outweigh the risks. AFTER THE TRANSFUSION  Right after receiving a blood transfusion, you will usually feel much better and more  energetic. This is especially true if your red blood cells have gotten low (anemic). The transfusion raises the level of the red blood cells which carry oxygen, and this usually causes an energy increase.  The nurse administering the transfusion will monitor you carefully for complications. HOME CARE INSTRUCTIONS  No special instructions are needed after a transfusion. You may find your energy is better. Speak with your caregiver about any limitations on activity for underlying diseases you may have. SEEK MEDICAL CARE IF:   Your condition is not improving after your transfusion.  You develop redness or irritation at the intravenous (IV) site. SEEK IMMEDIATE MEDICAL CARE IF:  Any of the following symptoms occur over the next 12 hours:  Shaking chills.  You have a temperature by mouth above 102 F (38.9 C), not controlled by medicine.  Chest, back, or muscle pain.  People around you feel you are not acting correctly or are confused.  Shortness of breath or difficulty breathing.  Dizziness and fainting.  You get a rash or develop hives.  You have a decrease in urine output.  Your urine turns a dark color or changes to pink, red, or brown. Any of the following symptoms occur over the next 10 days:  You have a temperature by mouth above 102 F (38.9 C), not controlled by medicine.  Shortness of breath.  Weakness after normal activity.  The white part of the eye turns yellow (jaundice).  You have a decrease in the amount of urine or are urinating less often.  Your urine turns a dark color or changes to pink, red, or brown. Document Released: 03/10/2000 Document Revised: 06/05/2011 Document Reviewed: 10/28/2007 [facility] Patient Information 2014 [place], [place].  _______________________________________________________________________

## 2014-07-07 NOTE — Patient Instructions (Addendum)
William Cameron  07/07/2014   Your procedure is scheduled on:   07-14-2014 Tuesday.  [facility] and [facility] signs [facility]. Arrive at    0830    AM .  Call this number if you have problems the morning of surgery: [phone]  Or Presurgical Testing [phone].   For Living Will and/or Health Care Power Attorney Forms: please provide copy for your medical record,may bring AM of surgery(Forms should be already notarized -we do not provide this service).(07-08-14  Yes, information provided today).  For Cpap use: Bring mask and tubing only.   Do not eat food/ or drink: After Midnight.    Take these medicines the morning of surgery with A SIP OF WATER: Amlodipine. Fenofibrate. Gabapentin. Do not take any Diabetic meds AM of.   Do not wear jewelry, make-up or nail polish.  Do not wear deodorant, lotions, powders, or perfumes.   Do not shave legs and under arms- 48 hours(2 days) prior to first CHG shower.(Shaving face and neck okay.)  Do not bring valuables to the hospital.(Hospital is not responsible for lost valuables).  Contacts, dentures or removable bridgework, body piercing, hair pins may not be worn into surgery.  Leave suitcase in the car. After surgery it may be brought to your room.  For patients admitted to the hospital, checkout time is 11:00 AM the day of discharge.(Restricted visitors-Any Persons displaying flu-like symptoms or illness).    Patients discharged the day of surgery will not be allowed to drive home. Must have responsible person with you x 24 hours once discharged.  Name and phone number of your driver: William Cameron [phone]     Please read over the following fact sheets that you were given:  CHG(Chlorhexidine Gluconate 4% Surgical Soap) use, MRSA Information, Blood Transfusion fact sheet, Incentive Spirometry Instruction.  Remember : Type/Screen "Blue armbands" - may not be removed once applied(would result in being retested  AM of surgery, if removed).         [facility] - Preparing for Surgery Before surgery, you can play an important role.  Because skin is not sterile, your skin needs to be as free of germs as possible.  You can reduce the number of germs on your skin by washing with CHG (chlorahexidine gluconate) soap before surgery.  CHG is an antiseptic cleaner which kills germs and bonds with the skin to continue killing germs even after washing. Please DO NOT use if you have an allergy to CHG or antibacterial soaps.  If your skin becomes reddened/irritated stop using the CHG and inform your nurse when you arrive at Short Stay. Do not shave (including legs and underarms) for at least 48 hours prior to the first CHG shower.  You may shave your face/neck. Please follow these instructions carefully:  1.  Shower with CHG Soap the night before surgery and the  morning of Surgery.  2.  If you choose to wash your hair, wash your hair first as usual with your  normal  shampoo.  3.  After you shampoo, rinse your hair and body thoroughly to remove the  shampoo.                           4.  Use CHG as you would any other liquid soap.  You can apply chg directly  to the skin and wash  Gently with a scrungie or clean washcloth.  5.  Apply the CHG Soap to your body ONLY FROM THE NECK DOWN.   Do not use on face/ open                           Wound or open sores. Avoid contact with eyes, ears mouth and genitals (private parts).                       Wash face,  Genitals (private parts) with your normal soap.             6.  Wash thoroughly, paying special attention to the area where your surgery  will be performed.  7.  Thoroughly rinse your body with warm water from the neck down.  8.  DO NOT shower/wash with your normal soap after using and rinsing off  the CHG Soap.                9.  Pat yourself dry with a clean towel.            10.  Wear clean pajamas.            11.  Place clean sheets on your  bed the night of your first shower and do not  sleep with pets. Day of Surgery : Do not apply any lotions/deodorants the morning of surgery.  Please wear clean clothes to the hospital/surgery center.  FAILURE TO FOLLOW THESE INSTRUCTIONS MAY RESULT IN THE CANCELLATION OF YOUR SURGERY PATIENT SIGNATURE_________________________________  NURSE SIGNATURE__________________________________  ________________________________________________________________________   William Cameron  An incentive spirometer is a tool that can help keep your lungs clear and active. This tool measures how well you are filling your lungs with each breath. Taking long deep breaths may help reverse or decrease the chance of developing breathing (pulmonary) problems (especially infection) following:  A long period of time when you are unable to move or be active. BEFORE THE PROCEDURE   If the spirometer includes an indicator to show your best effort, your nurse or respiratory therapist will set it to a desired goal.  If possible, sit up straight or lean slightly forward. Try not to slouch.  Hold the incentive spirometer in an upright position. INSTRUCTIONS FOR USE   Sit on the edge of your bed if possible, or sit up as far as you can in bed or on a chair.  Hold the incentive spirometer in an upright position.  Breathe out normally.  Place the mouthpiece in your mouth and seal your lips tightly around it.  Breathe in slowly and as deeply as possible, raising the piston or the ball toward the top of the column.  Hold your breath for 3-5 seconds or for as long as possible. Allow the piston or ball to fall to the bottom of the column.  Remove the mouthpiece from your mouth and breathe out normally.  Rest for a few seconds and repeat Steps 1 through 7 at least 10 times every 1-2 hours when you are awake. Take your time and take a few normal breaths between deep breaths.  The spirometer may include an  indicator to show your best effort. Use the indicator as a goal to work toward during each repetition.  After each set of 10 deep breaths, practice coughing to be sure your lungs are clear. If you have an incision (the cut made at the time of  surgery), support your incision when coughing by placing a pillow or rolled up towels firmly against it. Once you are able to get out of bed, walk around indoors and cough well. You may stop using the incentive spirometer when instructed by your caregiver.  RISKS AND COMPLICATIONS  Take your time so you do not get dizzy or light-headed.  If you are in pain, you may need to take or ask for pain medication before doing incentive spirometry. It is harder to take a deep breath if you are having pain. AFTER USE  Rest and breathe slowly and easily.  It can be helpful to keep track of a log of your progress. Your caregiver can provide you with a simple table to help with this. If you are using the spirometer at home, follow these instructions: [place] IF:   You are having difficultly using the spirometer.  You have trouble using the spirometer as often as instructed.  Your pain medication is not giving enough relief while using the spirometer.  You develop fever of 100.5 F (38.1 C) or higher. SEEK IMMEDIATE MEDICAL CARE IF:   You cough up bloody sputum that had not been present before.  You develop fever of 102 F (38.9 C) or greater.  You develop worsening pain at or near the incision site. MAKE SURE YOU:   Understand these instructions.  Will watch your condition.  Will get help right away if you are not doing well or get worse. Document Released: 07/24/2006 Document Revised: 06/05/2011 Document Reviewed: 09/24/2006 Exit[employer] Patient Information 2014 [employer], [place].   ________________________________________________________________________  WHAT IS A BLOOD TRANSFUSION? Blood Transfusion Information  A transfusion is the  replacement of blood or some of its parts. Blood is made up of multiple cells which provide different functions.  Red blood cells carry oxygen and are used for blood loss replacement.  White blood cells fight against infection.  Platelets control bleeding.  Plasma helps clot blood.  Other blood products are available for specialized needs, such as hemophilia or other clotting disorders. BEFORE THE TRANSFUSION  Who gives blood for transfusions?   Healthy volunteers who are fully evaluated to make sure their blood is safe. This is blood bank blood. Transfusion therapy is the safest it has ever been in the practice of medicine. Before blood is taken from a donor, a complete history is taken to make sure that person has no history of diseases nor engages in risky social behavior (examples are intravenous drug use or sexual activity with multiple partners). The donor's travel history is screened to minimize risk of transmitting infections, such as malaria. The donated blood is tested for signs of infectious diseases, such as HIV and hepatitis. The blood is then tested to be sure it is compatible with you in order to minimize the chance of a transfusion reaction. If you or a relative donates blood, this is often done in anticipation of surgery and is not appropriate for emergency situations. It takes many days to process the donated blood. RISKS AND COMPLICATIONS Although transfusion therapy is very safe and saves many lives, the main dangers of transfusion include:   Getting an infectious disease.  Developing a transfusion reaction. This is an allergic reaction to something in the blood you were given. Every precaution is taken to prevent this. The decision to have a blood transfusion has been considered carefully by your caregiver before blood is given. Blood is not given unless the benefits outweigh the risks. AFTER THE TRANSFUSION  Right after receiving a blood transfusion, you will usually  feel much better and more energetic. This is especially true if your red blood cells have gotten low (anemic). The transfusion raises the level of the red blood cells which carry oxygen, and this usually causes an energy increase.  The nurse administering the transfusion will monitor you carefully for complications. HOME CARE INSTRUCTIONS  No special instructions are needed after a transfusion. You may find your energy is better. Speak with your caregiver about any limitations on activity for underlying diseases you may have. SEEK MEDICAL CARE IF:   Your condition is not improving after your transfusion.  You develop redness or irritation at the intravenous (IV) site. SEEK IMMEDIATE MEDICAL CARE IF:  Any of the following symptoms occur over the next 12 hours:  Shaking chills.  You have a temperature by mouth above 102 F (38.9 C), not controlled by medicine.  Chest, back, or muscle pain.  People around you feel you are not acting correctly or are confused.  Shortness of breath or difficulty breathing.  Dizziness and fainting.  You get a rash or develop hives.  You have a decrease in urine output.  Your urine turns a dark color or changes to pink, red, or brown. Any of the following symptoms occur over the next 10 days:  You have a temperature by mouth above 102 F (38.9 C), not controlled by medicine.  Shortness of breath.  Weakness after normal activity.  The white part of the eye turns yellow (jaundice).  You have a decrease in the amount of urine or are urinating less often.  Your urine turns a dark color or changes to pink, red, or brown. Document Released: 03/10/2000 Document Revised: 06/05/2011 Document Reviewed: 10/28/2007 [facility] Patient Information 2014 [place], [place].  _______________________________________________________________________

## 2014-07-08 ENCOUNTER — Encounter (HOSPITAL_COMMUNITY): Payer: Self-pay

## 2014-07-08 ENCOUNTER — Encounter (HOSPITAL_COMMUNITY)
Admission: RE | Admit: 2014-07-08 | Discharge: 2014-07-08 | Disposition: A | Discharge status: Home / Self Care | Payer: 59 | Source: Ambulatory Visit | Attending: Orthopedic Surgery | Admitting: Orthopedic Surgery

## 2014-07-08 DIAGNOSIS — Z01818 Encounter for other preprocedural examination: Secondary | ICD-10-CM | POA: Insufficient documentation

## 2014-07-08 HISTORY — DX: Bronchitis, not specified as acute or chronic: J40

## 2014-07-08 LAB — BASIC METABOLIC PANEL
Anion gap: 8 (ref 5–15)
BUN: 22 mg/dL (ref 6–23)
CO2: 24 mmol/L (ref 19–32)
CREATININE: 1.11 mg/dL (ref 0.50–1.35)
Calcium: 9.7 mg/dL (ref 8.4–10.5)
Chloride: 104 mmol/L (ref 96–112)
GFR calc non Af Amer: 68 mL/min — ABNORMAL LOW (ref >90)
GFR, EST AFRICAN AMERICAN: 79 mL/min — AB (ref >90)
Glucose, Bld: 132 mg/dL — ABNORMAL HIGH (ref 70–99)
Potassium: 4.1 mmol/L (ref 3.5–5.1)
Sodium: 136 mmol/L (ref 135–145)

## 2014-07-08 LAB — URINALYSIS, ROUTINE W REFLEX MICROSCOPIC
Bilirubin Urine: NEGATIVE
Glucose, UA: >1000 mg/dL — AB
Hgb urine dipstick: NEGATIVE
KETONES UR: NEGATIVE mg/dL
LEUKOCYTES UA: NEGATIVE
NITRITE: NEGATIVE
Protein, ur: NEGATIVE mg/dL
SPECIFIC GRAVITY, URINE: 1.011 (ref 1.005–1.030)
Urobilinogen, UA: 0.2 mg/dL (ref 0.0–1.0)
pH: 5.5 (ref 5.0–8.0)

## 2014-07-08 LAB — CBC
HCT: 46.3 % (ref 39.0–52.0)
Hemoglobin: 14.8 g/dL (ref 13.0–17.0)
MCH: 28.4 pg (ref 26.0–34.0)
MCHC: 32 g/dL (ref 30.0–36.0)
MCV: 88.9 fL (ref 78.0–100.0)
PLATELETS: 168 10*3/uL (ref 150–400)
RBC: 5.21 MIL/uL (ref 4.22–5.81)
RDW: 13.5 % (ref 11.5–15.5)
WBC: 9.2 10*3/uL (ref 4.0–10.5)

## 2014-07-08 LAB — PROTIME-INR
INR: 1.09 (ref 0.00–1.49)
Prothrombin Time: 14.2 seconds (ref 11.6–15.2)

## 2014-07-08 LAB — SURGICAL PCR SCREEN
MRSA, PCR: NEGATIVE
Staphylococcus aureus: NEGATIVE

## 2014-07-08 LAB — URINE MICROSCOPIC-ADD ON

## 2014-07-08 LAB — APTT: APTT: 35 s (ref 24–37)

## 2014-07-08 MED ORDER — CHLORHEXIDINE GLUCONATE 4 % EX LIQD: 60.0000 mL | Freq: Once | CUTANEOUS | Status: DC

## 2014-07-08 NOTE — Pre-Procedure Instructions (Addendum)
07/08/14 1550 EKG reviewed with Dr. Council Mechanicenenny. Proceed as planned.  07/01/14 Medical Clearance, Dr. Ludwig ClarksMoreira, on chart 07/08/14 EKG done today, epic Bari-bed ordered for pt

## 2014-07-08 NOTE — H&P (Signed)
TOTAL HIP ADMISSION H&P  Patient is admitted for right total hip arthroplasty, posterior approach.  Subjective:  Chief Complaint:    Right hip primary OA / pain  HPI: William Cameron, 64 y.o. male, has a history of pain and functional disability in the right hip(s) due to arthritis and patient has failed non-surgical conservative treatments for greater than 12 weeks to include NSAID's and/or analgesics, corticosteriod injections, use of assistive devices and activity modification.  Onset of symptoms was gradual starting ~1 years ago with rapidlly worsening course since that time.The patient noted no past surgery on the right hip(s).  Patient currently rates pain in the right hip at 9 out of 10 with activity. Patient has night pain, worsening of pain with activity and weight bearing, trendelenberg gait, pain that interfers with activities of daily living and pain with passive range of motion. Patient has evidence of periarticular osteophytes and joint space narrowing by imaging studies. This condition presents safety issues increasing the risk of falls.  There is no current active infection.  Risks, benefits and expectations were discussed with the patient.  Risks including but not limited to the risk of anesthesia, blood clots, nerve damage, blood vessel damage, failure of the prosthesis, infection and up to and including death.  Patient understand the risks, benefits and expectations and wishes to proceed with surgery.   PCP: Ralene Ok, MD  D/C Plans:      Home with HHPT  Post-op Meds:       No Rx given   Tranexamic Acid:      To be given - IV    Decadron:      Is to be given  FYI:     ASA post-op  Norco post-op  CPAP    Patient Active Problem List   Diagnosis Date Noted  . OSA on CPAP 10/21/2013  . Obesity with sleep apnea 10/21/2013  . Severe obesity (BMI >= 40) 09/03/2013   Past Medical History  Diagnosis Date  . Diabetes mellitus without complication   . Hypertension   . COPD  (chronic obstructive pulmonary disease)   . 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   Marland Kitchen PONV (postoperative nausea and vomiting)     post op nausea  . Bronchitis     chronic bronchitis    Past Surgical History  Procedure Laterality Date  . Hernia repair    . Joint replacement      rt knee  . Hydrocele excision / repair    . Rotator cuff repair  rt  . Colonoscopy  02/02/2012    Procedure: COLONOSCOPY;  Surgeon: Theda Belfast, MD;  Location: [facility] ENDOSCOPY;  Service: Endoscopy;  Laterality: N/A;    No prescriptions prior to admission   Allergies  Allergen Reactions  . Penicillins Anaphylaxis  . Tetanus Toxoids Swelling    Can't breathe  . Sulfa Antibiotics Swelling    Swelling and nausea  . Morphine And Related Nausea Only    When multiple doses given    History  Substance Use Topics  . Smoking status: Never Smoker   . Smokeless tobacco: Never Used  . Alcohol Use: No    Family History  Problem Relation Age of Onset  . Prostate cancer Father 76  . Diabetes type II Mother      Review of Systems  Constitutional: Positive for weight loss (pt is  actively trying to lose weight).  HENT: Negative.   Eyes: Negative.   Respiratory: Negative.   Cardiovascular: Negative.   Gastrointestinal: Negative.   Genitourinary: Negative.   Musculoskeletal: Positive for joint pain.  Skin: Negative.   Neurological: Negative.   Endo/Heme/Allergies: Negative.   Psychiatric/Behavioral: Negative.     Objective:  Physical Exam  Constitutional: He is oriented to person, place, and time. He appears well-developed and well-nourished.  HENT:  Head: Normocephalic.  Eyes: Pupils are equal, round, and reactive to light.  Neck: Neck supple. No JVD present. No tracheal deviation present. No thyromegaly present.  Cardiovascular: Normal rate, regular rhythm, normal heart sounds and  intact distal pulses.   Respiratory: Effort normal and breath sounds normal. No stridor. No respiratory distress. He has no wheezes.  GI: Soft. There is no tenderness. There is no guarding.  Musculoskeletal:       Right hip: He exhibits decreased range of motion, decreased strength, tenderness and bony tenderness. He exhibits no swelling, no deformity and no laceration.  Lymphadenopathy:    He has no cervical adenopathy.  Neurological: He is alert and oriented to person, place, and time.  Skin: Skin is warm and dry.  Psychiatric: He has a normal mood and affect.    Vital signs in last 24 hours: Temp:  [98.2 F (36.8 C)] 98.2 F (36.8 C) (04/13 0817) Pulse Rate:  [57] 57 (04/13 0817) Resp:  [16] 16 (04/13 0817) BP: (136)/(55) 136/55 mmHg (04/13 0817) SpO2:  [97 %] 97 % (04/13 0817) Weight:  [141.976 kg (313 lb)] 141.976 kg (313 lb) (04/13 0817)  Labs:   Estimated body mass index is 48.41 kg/(m^2) as calculated from the following:   Height as of 10/21/13: 6' (1.829 m).   Weight as of 10/21/13: 161.934 kg (357 lb).   Imaging Review Plain radiographs demonstrate severe degenerative joint disease of the right hip(s). The bone quality appears to be good for age and reported activity level.  Assessment/Plan:  End stage arthritis, right hip(s)  The patient history, physical examination, clinical judgement of the provider and imaging studies are consistent with end stage degenerative joint disease of the right hip(s) and total hip arthroplasty is deemed medically necessary. The treatment options including medical management, injection therapy, arthroscopy and arthroplasty were discussed at length. The risks and benefits of total hip arthroplasty were presented and reviewed. The risks due to aseptic loosening, infection, stiffness, dislocation/subluxation,  thromboembolic complications and other imponderables were discussed.  The patient acknowledged the explanation, agreed to proceed with  the plan and consent was signed. Patient is being admitted for inpatient treatment for surgery, pain control, PT, OT, prophylactic antibiotics, VTE prophylaxis, progressive ambulation and ADL's and discharge planning.The patient is planning to be discharged home with home health services.     Anastasio AuerbachMatthew S. Noa Constante   PA-C  07/08/2014, 3:01 PM

## 2014-07-10 ENCOUNTER — Ambulatory Visit (INDEPENDENT_AMBULATORY_CARE_PROVIDER_SITE_OTHER): Payer: Self-pay | Admitting: Emergency Medicine

## 2014-07-10 VITALS — BP 134/70 | HR 71 | Temp 98.0°F | Resp 16 | Ht 70.0 in | Wt 306.0 lb

## 2014-07-10 DIAGNOSIS — Z0000 Encounter for general adult medical examination without abnormal findings: Secondary | ICD-10-CM

## 2014-07-10 DIAGNOSIS — Z021 Encounter for pre-employment examination: Secondary | ICD-10-CM

## 2014-07-10 NOTE — Progress Notes (Signed)
Subjective:  This chart was scribed for Collene Gobble, MD by Charline Bills, ED Scribe. The patient was seen in room 1. Patient's care was started at 10:31 AM.   Patient ID: William Cameron, male    DOB: November 15, 1950, 64 y.o.   MRN: [id number]  Chief Complaint  Patient presents with  . DOT phys    u/a: sp gr; 1.005, blood; neg, protein; neg, sugar; 2000+   HPI HPI Comments: William Cameron is a 64 y.o. male, with a h/o DM, HTN, COPD, who presents to the [facility] for a DOT physical. Pt denies any h/o tobacco use or alcohol use within the past few months.   R Hip Pain Pt has upcoming R total hip arthroplasty by Dr. Durene Romans on 07/14/14.   Diabetes Pt denies h/o neuropathy. He reports average blood glucose readings of 107-109. Pt is followed by Dr. Ralene Ok.   Sleep Apnea Pt reports using his cpap nightly. Pt is followed by neurologist Dr. [name]; his next appointment is in July 2016.   Weight Loss Pt reports 100 lb weight loss within the past year.   Past Medical History  Diagnosis Date  . Diabetes mellitus without complication   . Hypertension   . COPD (chronic obstructive pulmonary disease)   . 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   Marland Kitchen PONV (postoperative nausea and vomiting)     post op nausea  . Bronchitis     chronic bronchitis   Current Outpatient Prescriptions on File Prior to Visit  Medication Sig Dispense Refill  . amLODipine (NORVASC) 5 MG tablet Take 5 mg by mouth every morning.     . benazepril (LOTENSIN) 40 MG tablet Take 20 mg by mouth every morning.     . diclofenac (VOLTAREN) 75 MG EC tablet 1 tablet 2 (two) times daily.    . Empagliflozin-Linagliptin 10-5 MG TABS Take 1 tablet by mouth daily.    Marland Kitchen FARXIGA 5 MG TABS Take 1 tablet by mouth daily.     . Fenofibrate Micronized 90 MG CAPS Take 1 tablet by mouth  daily.    Marland Kitchen gabapentin (NEURONTIN) 300 MG capsule Take 2 capsules by mouth 2 (two) times daily.     Marland Kitchen HYDROcodone-acetaminophen (NORCO) 10-325 MG per tablet Take 1 tablet by mouth every 6 (six) hours as needed. for pain  0   No current facility-administered medications on file prior to visit.   Allergies  Allergen Reactions  . Penicillins Anaphylaxis  . Tetanus Toxoids Swelling    Can't breathe  . Sulfa Antibiotics Swelling    Swelling and nausea  . Morphine And Related Nausea Only    When multiple doses given    Review of Systems  Musculoskeletal: Positive for arthralgias.   BP 134/70 mmHg  Pulse 71  Temp(Src) 98 F (36.7 C)  Resp 16  Ht  (1.778 m)  Wt 306 lb (138.801 kg)  BMI 43.91 kg/m2  SpO2 92%    Objective:   Physical Exam CONSTITUTIONAL: Well developed/well nourished HEAD: Normocephalic/atraumatic EYES: EOMI/PERRL ENMT: Mucous membranes moist NECK: supple no meningeal signs SPINE/BACK:entire spine nontender CV: S1/S2 noted, no murmurs/rubs/gallops noted LUNGS: Lungs are clear to auscultation bilaterally, no apparent distress ABDOMEN: soft, nontender, obese, no rebound or guarding, bowel sounds noted throughout abdomen GU:no cva tenderness NEURO: Pt is awake/alert/appropriate, moves all  extremitiesx4.  No facial droop.   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 He will bring in a copy of his cpap readings for compliance. He is restricted for lenses. He is going to have a hip replacement next week.I personally performed the services described in this documentation, which was scribed in my presence. The recorded information has been reviewed and is accurate.

## 2014-07-13 MED ORDER — VANCOMYCIN HCL 10 G IV SOLR
1500.0000 mg | INTRAVENOUS | Status: AC
  Administered 2014-07-14: 1500 mg via INTRAVENOUS
  Filled 2014-07-13: qty 1500

## 2014-07-13 NOTE — Anesthesia Preprocedure Evaluation (Addendum)
Anesthesia Evaluation  Patient identified by MRN, date of birth, ID band Patient awake    Reviewed: Allergy & Precautions, NPO status , Patient's Chart, lab work & pertinent test results, reviewed documented beta blocker date and time   History of Anesthesia Complications (+) PONV  Airway Mallampati: II   Neck ROM: Full    Dental  (+) Teeth Intact, Dental Advisory Given   Pulmonary sleep apnea and Continuous Positive Airway Pressure Ventilation , COPD breath sounds clear to auscultation        Cardiovascular hypertension, Pt. 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    Peds  Hematology 14/46 INR 1.0   Anesthesia Other Findings   Reproductive/Obstetrics                            Anesthesia Physical Anesthesia Plan  ASA: III  Anesthesia Plan: Spinal   Post-op Pain Management:    Induction:   Airway Management Planned: Natural Airway  Additional Equipment:   Intra-op Plan:   Post-operative Plan:   Informed Consent: I have reviewed the patients History and Physical, chart, labs and discussed the procedure including the risks, benefits and alternatives for the proposed anesthesia with the patient or authorized representative who has indicated his/her understanding and acceptance.     Plan Discussed with:   Anesthesia Plan Comments:         Anesthesia Quick Evaluation

## 2014-07-14 ENCOUNTER — Encounter (HOSPITAL_COMMUNITY): Payer: Self-pay | Admitting: *Deleted

## 2014-07-14 ENCOUNTER — Encounter (HOSPITAL_COMMUNITY)
Admission: RE | Disposition: A | Discharge status: Home Health | Payer: Self-pay | Source: Ambulatory Visit | Attending: Orthopedic Surgery

## 2014-07-14 ENCOUNTER — Inpatient Hospital Stay (HOSPITAL_COMMUNITY)
Admission: RE | Admit: 2014-07-14 | Discharge: 2014-07-15 | DRG: 470 | Disposition: A | Discharge status: Home Health | Payer: 59 | Source: Ambulatory Visit | Attending: Orthopedic Surgery | Admitting: Orthopedic Surgery

## 2014-07-14 ENCOUNTER — Inpatient Hospital Stay (HOSPITAL_COMMUNITY): Payer: 59 | Admitting: Anesthesiology

## 2014-07-14 ENCOUNTER — Inpatient Hospital Stay (HOSPITAL_COMMUNITY): Payer: 59

## 2014-07-14 DIAGNOSIS — J449 Chronic obstructive pulmonary disease, unspecified: Secondary | ICD-10-CM | POA: Diagnosis present

## 2014-07-14 DIAGNOSIS — Z96651 Presence of right artificial knee joint: Secondary | ICD-10-CM | POA: Diagnosis present

## 2014-07-14 DIAGNOSIS — Z96649 Presence of unspecified artificial hip joint: Secondary | ICD-10-CM

## 2014-07-14 DIAGNOSIS — M1611 Unilateral primary osteoarthritis, right hip: Principal | ICD-10-CM | POA: Diagnosis present

## 2014-07-14 DIAGNOSIS — Z01812 Encounter for preprocedural laboratory examination: Secondary | ICD-10-CM

## 2014-07-14 DIAGNOSIS — E119 Type 2 diabetes mellitus without complications: Secondary | ICD-10-CM | POA: Diagnosis present

## 2014-07-14 DIAGNOSIS — I10 Essential (primary) hypertension: Secondary | ICD-10-CM | POA: Diagnosis present

## 2014-07-14 DIAGNOSIS — Z6841 Body Mass Index (BMI) 40.0 and over, adult: Secondary | ICD-10-CM

## 2014-07-14 DIAGNOSIS — M25551 Pain in right hip: Secondary | ICD-10-CM | POA: Diagnosis present

## 2014-07-14 DIAGNOSIS — G4733 Obstructive sleep apnea (adult) (pediatric): Secondary | ICD-10-CM | POA: Diagnosis present

## 2014-07-14 HISTORY — PX: TOTAL HIP ARTHROPLASTY: SHX124

## 2014-07-14 LAB — GLUCOSE, CAPILLARY
Glucose-Capillary: 123 mg/dL — ABNORMAL HIGH (ref 70–99)
Glucose-Capillary: 117 mg/dL — ABNORMAL HIGH (ref 70–99)
Glucose-Capillary: 145 mg/dL — ABNORMAL HIGH (ref 70–99)
Glucose-Capillary: 164 mg/dL — ABNORMAL HIGH (ref 70–99)

## 2014-07-14 LAB — TYPE AND SCREEN
ABO/RH(D): O POS
ANTIBODY SCREEN: NEGATIVE

## 2014-07-14 SURGERY — ARTHROPLASTY, HIP, TOTAL,POSTERIOR APPROACH
Anesthesia: Spinal | Site: Hip | Laterality: Right

## 2014-07-14 MED ORDER — AMLODIPINE BESYLATE 5 MG PO TABS
5.0000 mg | ORAL_TABLET | Freq: Every morning | ORAL | Status: DC
  Administered 2014-07-15: 5 mg via ORAL
  Filled 2014-07-14: qty 1

## 2014-07-14 MED ORDER — DEXAMETHASONE SODIUM PHOSPHATE 10 MG/ML IJ SOLN
10.0000 mg | Freq: Once | INTRAMUSCULAR | Status: AC
  Administered 2014-07-14: 10 mg via INTRAVENOUS

## 2014-07-14 MED ORDER — MENTHOL 3 MG MT LOZG: 1.0000 | LOZENGE | OROMUCOSAL | Status: DC | PRN

## 2014-07-14 MED ORDER — PROMETHAZINE HCL 25 MG/ML IJ SOLN: 6.2500 mg | INTRAMUSCULAR | Status: DC | PRN

## 2014-07-14 MED ORDER — MEPERIDINE HCL 50 MG/ML IJ SOLN: 6.2500 mg | INTRAMUSCULAR | Status: DC | PRN

## 2014-07-14 MED ORDER — 0.9 % SODIUM CHLORIDE (POUR BTL) OPTIME
TOPICAL | Status: DC | PRN
  Administered 2014-07-14: 1000 mL

## 2014-07-14 MED ORDER — PROPOFOL 10 MG/ML IV BOLUS
INTRAVENOUS | Status: AC
  Filled 2014-07-14: qty 20

## 2014-07-14 MED ORDER — FENTANYL CITRATE (PF) 100 MCG/2ML IJ SOLN
INTRAMUSCULAR | Status: DC | PRN
  Administered 2014-07-14: 50 ug via INTRAVENOUS

## 2014-07-14 MED ORDER — ACETAMINOPHEN 650 MG RE SUPP: 650.0000 mg | Freq: Four times a day (QID) | RECTAL | Status: DC | PRN

## 2014-07-14 MED ORDER — MIDAZOLAM HCL 5 MG/5ML IJ SOLN
INTRAMUSCULAR | Status: DC | PRN
  Administered 2014-07-14: 2 mg via INTRAVENOUS

## 2014-07-14 MED ORDER — TRANEXAMIC ACID 100 MG/ML IV SOLN
1000.0000 mg | Freq: Once | INTRAVENOUS | Status: AC
  Administered 2014-07-14: 1000 mg via INTRAVENOUS
  Filled 2014-07-14: qty 10

## 2014-07-14 MED ORDER — MIDAZOLAM HCL 2 MG/2ML IJ SOLN
INTRAMUSCULAR | Status: AC
  Filled 2014-07-14: qty 2

## 2014-07-14 MED ORDER — MAGNESIUM CITRATE PO SOLN: 1.0000 | Freq: Once | ORAL | Status: AC | PRN

## 2014-07-14 MED ORDER — ONDANSETRON HCL 4 MG/2ML IJ SOLN: 4.0000 mg | Freq: Four times a day (QID) | INTRAMUSCULAR | Status: DC | PRN

## 2014-07-14 MED ORDER — LACTATED RINGERS IV SOLN
INTRAVENOUS | Status: DC
  Administered 2014-07-14 (×2): via INTRAVENOUS

## 2014-07-14 MED ORDER — METOCLOPRAMIDE HCL 10 MG PO TABS: 5.0000 mg | ORAL_TABLET | Freq: Three times a day (TID) | ORAL | Status: DC | PRN

## 2014-07-14 MED ORDER — METOCLOPRAMIDE HCL 5 MG/ML IJ SOLN: 5.0000 mg | Freq: Three times a day (TID) | INTRAMUSCULAR | Status: DC | PRN

## 2014-07-14 MED ORDER — EPHEDRINE SULFATE 50 MG/ML IJ SOLN
INTRAMUSCULAR | Status: DC | PRN
  Administered 2014-07-14: 10 mg via INTRAVENOUS

## 2014-07-14 MED ORDER — ONDANSETRON HCL 4 MG PO TABS: 4.0000 mg | ORAL_TABLET | Freq: Four times a day (QID) | ORAL | Status: DC | PRN

## 2014-07-14 MED ORDER — DOCUSATE SODIUM 100 MG PO CAPS
100.0000 mg | ORAL_CAPSULE | Freq: Two times a day (BID) | ORAL | Status: DC
  Administered 2014-07-15: 100 mg via ORAL

## 2014-07-14 MED ORDER — SCOPOLAMINE 1 MG/3DAYS TD PT72
1.0000 | MEDICATED_PATCH | TRANSDERMAL | Status: DC
  Administered 2014-07-14: 1.5 mg via TRANSDERMAL
  Filled 2014-07-14 (×2): qty 1

## 2014-07-14 MED ORDER — SODIUM CHLORIDE 0.9 % IV SOLN
100.0000 mL/h | INTRAVENOUS | Status: DC
  Administered 2014-07-14: 100 mL/h via INTRAVENOUS
  Administered 2014-07-15: 20 mL/h via INTRAVENOUS
  Filled 2014-07-14 (×4): qty 1000

## 2014-07-14 MED ORDER — GLYCOPYRROLATE 0.2 MG/ML IJ SOLN
INTRAMUSCULAR | Status: DC | PRN
  Administered 2014-07-14: 0.2 mg via INTRAVENOUS

## 2014-07-14 MED ORDER — PHENOL 1.4 % MT LIQD: 1.0000 | OROMUCOSAL | Status: DC | PRN

## 2014-07-14 MED ORDER — VANCOMYCIN HCL IN DEXTROSE 1-5 GM/200ML-% IV SOLN
1000.0000 mg | Freq: Two times a day (BID) | INTRAVENOUS | Status: AC
  Administered 2014-07-14: 1000 mg via INTRAVENOUS
  Filled 2014-07-14: qty 200

## 2014-07-14 MED ORDER — FERROUS SULFATE 325 (65 FE) MG PO TABS
325.0000 mg | ORAL_TABLET | Freq: Three times a day (TID) | ORAL | Status: DC
  Administered 2014-07-15: 325 mg via ORAL
  Filled 2014-07-14 (×4): qty 1

## 2014-07-14 MED ORDER — EPHEDRINE SULFATE 50 MG/ML IJ SOLN
INTRAMUSCULAR | Status: AC
  Filled 2014-07-14: qty 1

## 2014-07-14 MED ORDER — CLINDAMYCIN HCL 150 MG PO CAPS
150.0000 mg | ORAL_CAPSULE | Freq: Three times a day (TID) | ORAL | Status: DC
  Filled 2014-07-14 (×5): qty 1

## 2014-07-14 MED ORDER — HYDROMORPHONE HCL 1 MG/ML IJ SOLN
0.5000 mg | INTRAMUSCULAR | Status: DC | PRN
  Administered 2014-07-14: 0.5 mg via INTRAVENOUS
  Filled 2014-07-14: qty 1

## 2014-07-14 MED ORDER — FENTANYL CITRATE (PF) 100 MCG/2ML IJ SOLN
INTRAMUSCULAR | Status: AC
  Filled 2014-07-14: qty 2

## 2014-07-14 MED ORDER — DAPAGLIFLOZIN PROPANEDIOL 5 MG PO TABS
5.0000 mg | ORAL_TABLET | Freq: Every day | ORAL | Status: DC
  Filled 2014-07-14: qty 150

## 2014-07-14 MED ORDER — DIPHENHYDRAMINE HCL 25 MG PO CAPS: 25.0000 mg | ORAL_CAPSULE | Freq: Four times a day (QID) | ORAL | Status: DC | PRN

## 2014-07-14 MED ORDER — EMPAGLIFLOZIN-LINAGLIPTIN 10-5 MG PO TABS: 1.0000 | ORAL_TABLET | Freq: Every day | ORAL | Status: DC

## 2014-07-14 MED ORDER — PROPOFOL 10 MG/ML IV BOLUS
INTRAVENOUS | Status: DC | PRN
  Administered 2014-07-14: 40 mg via INTRAVENOUS

## 2014-07-14 MED ORDER — FENTANYL CITRATE (PF) 100 MCG/2ML IJ SOLN: 25.0000 ug | INTRAMUSCULAR | Status: DC | PRN

## 2014-07-14 MED ORDER — METHOCARBAMOL 1000 MG/10ML IJ SOLN
500.0000 mg | Freq: Four times a day (QID) | INTRAVENOUS | Status: DC | PRN
  Filled 2014-07-14: qty 5

## 2014-07-14 MED ORDER — GABAPENTIN 300 MG PO CAPS
600.0000 mg | ORAL_CAPSULE | Freq: Two times a day (BID) | ORAL | Status: DC
  Administered 2014-07-14 – 2014-07-15 (×2): 600 mg via ORAL
  Filled 2014-07-14 (×3): qty 2

## 2014-07-14 MED ORDER — ALUM & MAG HYDROXIDE-SIMETH 200-200-20 MG/5ML PO SUSP: 30.0000 mL | ORAL | Status: DC | PRN

## 2014-07-14 MED ORDER — POLYETHYLENE GLYCOL 3350 17 G PO PACK: 17.0000 g | PACK | Freq: Two times a day (BID) | ORAL | Status: DC

## 2014-07-14 MED ORDER — BISACODYL 10 MG RE SUPP
10.0000 mg | Freq: Every day | RECTAL | Status: DC | PRN
Start: 2014-07-14 — End: 2014-07-15

## 2014-07-14 MED ORDER — INSULIN ASPART 100 UNIT/ML ~~LOC~~ SOLN: 0.0000 [IU] | Freq: Three times a day (TID) | SUBCUTANEOUS | Status: DC

## 2014-07-14 MED ORDER — ASPIRIN EC 325 MG PO TBEC
325.0000 mg | DELAYED_RELEASE_TABLET | Freq: Two times a day (BID) | ORAL | Status: DC
  Administered 2014-07-15: 325 mg via ORAL
  Filled 2014-07-14 (×3): qty 1

## 2014-07-14 MED ORDER — METHOCARBAMOL 500 MG PO TABS
500.0000 mg | ORAL_TABLET | Freq: Four times a day (QID) | ORAL | Status: DC | PRN
  Administered 2014-07-14: 500 mg via ORAL
  Filled 2014-07-14 (×2): qty 1

## 2014-07-14 MED ORDER — PROPOFOL INFUSION 10 MG/ML OPTIME
INTRAVENOUS | Status: DC | PRN
  Administered 2014-07-14: 200 ug/kg/min via INTRAVENOUS

## 2014-07-14 MED ORDER — DEXAMETHASONE SODIUM PHOSPHATE 10 MG/ML IJ SOLN: 10.0000 mg | Freq: Once | INTRAMUSCULAR | Status: DC

## 2014-07-14 MED ORDER — OXYCODONE HCL 5 MG PO TABS
5.0000 mg | ORAL_TABLET | ORAL | Status: DC
  Administered 2014-07-14 – 2014-07-15 (×4): 10 mg via ORAL
  Filled 2014-07-14: qty 3
  Filled 2014-07-14: qty 2
  Filled 2014-07-14 (×2): qty 3

## 2014-07-14 MED ORDER — ACETAMINOPHEN 325 MG PO TABS: 650.0000 mg | ORAL_TABLET | Freq: Four times a day (QID) | ORAL | Status: DC | PRN

## 2014-07-14 SURGICAL SUPPLY — 57 items
BAG DECANTER FOR FLEXI CONT (MISCELLANEOUS) ×2 IMPLANT
BAG ZIPLOCK 12X15 (MISCELLANEOUS) ×2 IMPLANT
BLADE SAW SGTL 18X1.27X75 (BLADE) ×2 IMPLANT
CAPT HIP TOTAL 2 ×2 IMPLANT
DRAPE INCISE IOBAN 85X60 (DRAPES) ×2 IMPLANT
DRAPE ORTHO SPLIT 77X108 STRL (DRAPES) ×2
DRAPE POUCH INSTRU U-SHP 10X18 (DRAPES) ×2 IMPLANT
DRAPE SURG 17X11 SM STRL (DRAPES) ×2 IMPLANT
DRAPE SURG ORHT 6 SPLT 77X108 (DRAPES) ×2 IMPLANT
DRAPE U-SHAPE 47X51 STRL (DRAPES) ×2 IMPLANT
DRSG AQUACEL AG ADV 3.5X10 (GAUZE/BANDAGES/DRESSINGS) ×2 IMPLANT
DRSG TEGADERM 4X4.75 (GAUZE/BANDAGES/DRESSINGS) ×2 IMPLANT
DURAPREP 26ML APPLICATOR (WOUND CARE) ×2 IMPLANT
ELECT BLADE TIP CTD 4 INCH (ELECTRODE) ×2 IMPLANT
ELECT REM PT RETURN 9FT ADLT (ELECTROSURGICAL) ×2
ELECTRODE REM PT RTRN 9FT ADLT (ELECTROSURGICAL) ×1 IMPLANT
FACESHIELD WRAPAROUND (MASK) ×8 IMPLANT
GAUZE SPONGE 2X2 8PLY STRL LF (GAUZE/BANDAGES/DRESSINGS) ×1 IMPLANT
GLOVE BIOGEL PI IND STRL 6.5 (GLOVE) ×1 IMPLANT
GLOVE BIOGEL PI IND STRL 7.5 (GLOVE) ×3 IMPLANT
GLOVE BIOGEL PI IND STRL 8 (GLOVE) ×1 IMPLANT
GLOVE BIOGEL PI IND STRL 8.5 (GLOVE) ×1 IMPLANT
GLOVE BIOGEL PI INDICATOR 6.5 (GLOVE) ×1
GLOVE BIOGEL PI INDICATOR 7.5 (GLOVE) ×3
GLOVE BIOGEL PI INDICATOR 8 (GLOVE) ×1
GLOVE BIOGEL PI INDICATOR 8.5 (GLOVE) ×1
GLOVE ECLIPSE 8.0 STRL XLNG CF (GLOVE) ×2 IMPLANT
GLOVE ORTHO TXT STRL SZ7.5 (GLOVE) ×4 IMPLANT
GLOVE SURG SS PI 7.0 STRL IVOR (GLOVE) ×2 IMPLANT
GLOVE SURG SS PI 7.5 STRL IVOR (GLOVE) ×2 IMPLANT
GLOVE SURG SS PI 8.0 STRL IVOR (GLOVE) ×2 IMPLANT
GOWN BRE IMP PREV XXLGXLNG (GOWN DISPOSABLE) ×2 IMPLANT
GOWN SPEC L3 XXLG W/TWL (GOWN DISPOSABLE) ×2 IMPLANT
GOWN STRL REIN 3XL XLG LVL4 (GOWN DISPOSABLE) ×2 IMPLANT
GOWN STRL REUS W/TWL LRG LVL3 (GOWN DISPOSABLE) ×4 IMPLANT
KIT BASIN OR (CUSTOM PROCEDURE TRAY) ×2 IMPLANT
LIQUID BAND (GAUZE/BANDAGES/DRESSINGS) ×2 IMPLANT
MANIFOLD NEPTUNE II (INSTRUMENTS) ×2 IMPLANT
NDL SAFETY ECLIPSE 18X1.5 (NEEDLE) ×1 IMPLANT
NEEDLE HYPO 18GX1.5 SHARP (NEEDLE) ×1
NS IRRIG 1000ML POUR BTL (IV SOLUTION) ×2 IMPLANT
PACK TOTAL JOINT (CUSTOM PROCEDURE TRAY) ×2 IMPLANT
PADDING CAST COTTON 6X4 STRL (CAST SUPPLIES) ×2 IMPLANT
PEN SKIN MARKING BROAD (MISCELLANEOUS) ×2 IMPLANT
POSITIONER SURGICAL ARM (MISCELLANEOUS) ×2 IMPLANT
SPONGE GAUZE 2X2 STER 10/PKG (GAUZE/BANDAGES/DRESSINGS) ×1
SUCTION FRAZIER TIP 10 FR DISP (SUCTIONS) ×2 IMPLANT
SUT MNCRL AB 4-0 PS2 18 (SUTURE) ×2 IMPLANT
SUT VIC AB 1 CT1 36 (SUTURE) ×6 IMPLANT
SUT VIC AB 2-0 CT1 27 (SUTURE) ×2
SUT VIC AB 2-0 CT1 TAPERPNT 27 (SUTURE) ×2 IMPLANT
SUT VLOC 180 0 24IN GS25 (SUTURE) ×4 IMPLANT
SYR 50ML LL SCALE MARK (SYRINGE) ×2 IMPLANT
TOWEL OR 17X26 10 PK STRL BLUE (TOWEL DISPOSABLE) ×4 IMPLANT
TRAY FOLEY W/METER SILVER 14FR (SET/KITS/TRAYS/PACK) ×2 IMPLANT
WATER STERILE IRR 1500ML POUR (IV SOLUTION) ×2 IMPLANT
YANKAUER SUCT BULB TIP 10FT TU (MISCELLANEOUS) ×2 IMPLANT

## 2014-07-14 NOTE — Transfer of Care (Signed)
Immediate Anesthesia Transfer of Care Note  Patient: William Cameron  Procedure(s) Performed: Procedure(s): RIGHT TOTAL HIP ARTHROPLASTY (Right)  Patient Location: PACU  Anesthesia Type:Regional and Spinal  Level of Consciousness: awake, sedated and patient cooperative  Airway & Oxygen Therapy: Patient Spontanous Breathing and Patient connected to face mask oxygen  Post-op Assessment: Report given to RN and Post -op Vital signs reviewed and stable  Post vital signs: Reviewed and stable  Last Vitals:  Filed Vitals:   07/14/14 0820  BP: 143/78  Pulse: 72  Temp: 36.6 C  Resp: 18    Complications: No apparent anesthesia complications

## 2014-07-14 NOTE — Progress Notes (Signed)
Pt states he is not always compliant with CPAP at home and prefers not to wear CPAP tonight. Pt remains on 2Lpm nasal cannula SpO2 97% HR-78. Pt was encouraged to contact RT if he changes his mind. RN notified. RT will continue to monitor as needed.

## 2014-07-14 NOTE — Care Management Note (Signed)
CARE MANAGEMENT NOTE 07/14/2014  Patient:  William Cameron,William Cameron   Account Number:  [id number]  Date Initiated:  07/14/2014  Documentation initiated by:  DAVIS,RHONDA  Subjective/Objective Assessment:   toal rt hip replacement     Action/Plan:   home when stable   Anticipated DC Date:  07/17/2014   Anticipated DC Plan:  HOME W HOME HEALTH SERVICES  In-house referral  NA      DC Planning Services  CM consult      [facility]  NA   Choice offered to / List presented to:  C-1 Patient   DME arranged  B[facility]SIDE [facility]  [facility]      DME agency  [facility]     HH arranged  HH[facility]2 PT      [facility]   Status of service:  In process, will continue to follow Medicare Important Message given?   (If response is "NO", the following Medicare IM given date fields will be blank) Date Medicare IM given:   Medicare IM given by:   Date Additional Medicare IM given:   Additional Medicare IM given by:    Discharge Disposition:    Per UR Regulation:  Reviewed for med. necessity/level of care/duration of stay  If discussed at Long Length of Stay Meetings, dates discussed:    Comments:  July 14, 2014/Rhonda L. Earlene Plateravis, RN, BSN, CCM. Case Management [employer] [phone] No discharge needs present of time of review.

## 2014-07-14 NOTE — Interval H&P Note (Signed)
History and Physical Interval Note:  07/14/2014 9:53 AM  William Cameron  has presented today for surgery, with the diagnosis of RIGHT HIP OA  The various methods of treatment have been discussed with the patient and family. After consideration of risks, benefits and other options for treatment, the patient has consented to  Procedure(s): RIGHT TOTAL HIP ARTHROPLASTY (Right) as a surgical intervention .  The patient's history has been reviewed, patient examined, no change in status, stable for surgery.  I have reviewed the patient's chart and labs.  Questions were answered to the patient's satisfaction.     Shelda PalLIN,Gradie Butrick D

## 2014-07-14 NOTE — Anesthesia Procedure Notes (Signed)
Spinal Patient location during procedure: OR Start time: 07/14/2014 11:00 AM End time: 07/14/2014 11:08 AM Staffing Anesthesiologist: Sebastian AcheMANNY, Myrth Dahan Performed by: anesthesiologist  Preanesthetic Checklist Completed: patient identified, site marked, surgical consent, pre-op evaluation, timeout performed, IV checked, risks and benefits discussed and monitors and equipment checked Spinal Block Patient position: sitting Prep: Betadine and site prepped and draped Patient monitoring: heart rate, cardiac monitor and blood pressure Approach: midline Location: L2-3 Injection technique: single-shot Needle Needle gauge: 22 G Needle length: 9 cm Needle insertion depth: 8 cm

## 2014-07-14 NOTE — Discharge Instructions (Signed)

## 2014-07-14 NOTE — Plan of Care (Signed)
Problem: Consults Goal: Diagnosis- Total Joint Replacement Right total hip Goal: Nutrition Consult-if indicated Outcome: Completed/Met Date Met:  07/14/14 Will order

## 2014-07-14 NOTE — Brief Op Note (Signed)
07/14/2014  12:43 PM  PATIENT:  William Cameron  64 y.o. male  PRE-OPERATIVE DIAGNOSIS:  RIGHT HIP OSTEOARTHRITIS  POST-OPERATIVE DIAGNOSIS:  RIGHT HIP OSTEOARTHRITIS  PROCEDURE:  Procedure(s): RIGHT TOTAL HIP ARTHROPLASTY (Right)  SURGEON:  Surgeon(s) and Role:    * Durene RomansMatthew Nataniel Gasper, MD - Primary  PHYSICIAN ASSISTANT: Lanney GinsMatthew Babish, PA-C  ANESTHESIA:   spinal  EBL:  Total I/O In: 1000 [I.V.:1000] Out: 350 [Blood:350]  BLOOD ADMINISTERED:none  DRAINS: none   LOCAL MEDICATIONS USED:  NONE  SPECIMEN:  No Specimen  DISPOSITION OF SPECIMEN:  N/A  COUNTS:  YES  TOURNIQUET:  * No tourniquets in log *  DICTATION: .Other Dictation: Dictation Number [id number]  PLAN OF CARE: Admit to inpatient   PATIENT DISPOSITION:  PACU - hemodynamically stable.   Delay start of Pharmacological VTE agent (>24hrs) due to surgical blood loss or risk of bleeding: no

## 2014-07-14 NOTE — Anesthesia Postprocedure Evaluation (Signed)
  Anesthesia Post-op Note  Patient: William Cameron  Procedure(s) Performed: Procedure(s): RIGHT TOTAL HIP ARTHROPLASTY (Right)  Patient Location: PACU  Anesthesia Type:Spinal  Level of Consciousness: awake and alert   Airway and Oxygen Therapy: Patient Spontanous Breathing and Patient connected to nasal cannula oxygen  Post-op Pain: mild  Post-op Assessment: Post-op Vital signs reviewed and Patient's Cardiovascular Status Stable  Post-op Vital Signs: Reviewed and stable  Last Vitals:  Filed Vitals:   07/14/14 1315  BP:   Pulse: 45  Temp:   Resp: 15    Complications: No apparent anesthesia complications

## 2014-07-15 LAB — BASIC METABOLIC PANEL
ANION GAP: 9 (ref 5–15)
BUN: 16 mg/dL (ref 6–23)
CALCIUM: 9.4 mg/dL (ref 8.4–10.5)
CO2: 23 mmol/L (ref 19–32)
Chloride: 105 mmol/L (ref 96–112)
Creatinine, Ser: 0.97 mg/dL (ref 0.50–1.35)
GFR calc non Af Amer: 85 mL/min — ABNORMAL LOW (ref >90)
Glucose, Bld: 137 mg/dL — ABNORMAL HIGH (ref 70–99)
Potassium: 4.1 mmol/L (ref 3.5–5.1)
SODIUM: 137 mmol/L (ref 135–145)

## 2014-07-15 LAB — CBC
HCT: 40.5 % (ref 39.0–52.0)
Hemoglobin: 12.9 g/dL — ABNORMAL LOW (ref 13.0–17.0)
MCH: 28.4 pg (ref 26.0–34.0)
MCHC: 31.9 g/dL (ref 30.0–36.0)
MCV: 89 fL (ref 78.0–100.0)
Platelets: 183 10*3/uL (ref 150–400)
RBC: 4.55 MIL/uL (ref 4.22–5.81)
RDW: 13.5 % (ref 11.5–15.5)
WBC: 12 10*3/uL — AB (ref 4.0–10.5)

## 2014-07-15 LAB — GLUCOSE, CAPILLARY: Glucose-Capillary: 129 mg/dL — ABNORMAL HIGH (ref 70–99)

## 2014-07-15 MED ORDER — FERROUS SULFATE 325 (65 FE) MG PO TABS: 325.0000 mg | ORAL_TABLET | Freq: Three times a day (TID) | ORAL | Status: DC

## 2014-07-15 MED ORDER — ACETAMINOPHEN 325 MG PO TABS: 650.0000 mg | ORAL_TABLET | Freq: Four times a day (QID) | ORAL | Status: DC | PRN

## 2014-07-15 MED ORDER — POLYETHYLENE GLYCOL 3350 17 G PO PACK: 17.0000 g | PACK | Freq: Two times a day (BID) | ORAL | Status: DC

## 2014-07-15 MED ORDER — OXYCODONE HCL 5 MG PO TABS: 5.0000 mg | ORAL_TABLET | ORAL | Status: DC | PRN

## 2014-07-15 MED ORDER — ASPIRIN 325 MG PO TBEC: 325.0000 mg | DELAYED_RELEASE_TABLET | Freq: Two times a day (BID) | ORAL | Status: AC

## 2014-07-15 MED ORDER — DOCUSATE SODIUM 100 MG PO CAPS: 100.0000 mg | ORAL_CAPSULE | Freq: Two times a day (BID) | ORAL | Status: DC

## 2014-07-15 MED ORDER — METHOCARBAMOL 500 MG PO TABS: 500.0000 mg | ORAL_TABLET | Freq: Four times a day (QID) | ORAL | Status: DC | PRN

## 2014-07-15 NOTE — Op Note (Signed)
NAMLenor Cameron:  William Cameron, William Cameron                ACCOUNT NO.:  [id number]  MEDICAL RECORD NO.:  [id number]  LOCATION:  1602                         [facility]  PHYSICIAN:  Madlyn FrankelMatthew D. Charlann Boxerlin, M.D.  DATE OF BIRTH:  07-17-1950  DATE OF PROCEDURE:  07/14/2014 DATE OF DISCHARGE:                              OPERATIVE REPORT   PREOPERATIVE DIAGNOSES: 1. Right hip osteoarthritis. 2. Morbid obesity.  POSTOPERATIVE DIAGNOSES: 1. Right hip osteoarthritis. 2. Morbid obesity.  PROCEDURE:  Right total hip arthroplasty performed through a posterior approach based on this size, particularly the adipose in the anterior aspect of his joint including his abdominal pannus.  COMPONENTS USED:  DePuy hip system with size 56 mm Pinnacle cup, a 36+ 4 neutral liner, size 7 high Tri-Lock stem with a 36+ 1.5 delta ceramic ball.  SURGEON:  Madlyn FrankelMatthew D. Charlann Boxerlin, M.D.  ASSISTANT:  Lanney GinsMatthew Babish, PA-C.  Note that, William Cameron was present for the entirety of the case from preoperative positioning to perioperative management of the operative extremity, general facilitation of the case, and primary wound closure.  ANESTHESIA:  Spinal.  SPECIMENS:  None.  DRAINS:  None.  COMPLICATIONS:  None.  BLOOD LOSS:  About 400 mL.  ANESTHESIA:  General.  INDICATIONS FOR PROCEDURE:  William Cameron is a 64 year old male who I have been following for some time with right hip arthritis, this has progressively worsened over the years.  He unfortunately had some significant weight issues, which he has done well to try to lose some weight.  At this point, he feels that he has lost weight enough to understand the risks and benefits, but also recognizing the significant issues as it pertains to his hip and his functional ability.  He at this point is ready to proceed.  We reiterated and stressed the concerns of the risks of infection, DVT, component failure, dislocation.  I discussed with him that I felt that it was going to be in  his best interest to have this done through the traditional posterior approach as opposed to the anterior approach due to the abdominal pannus and concern for wound infection as well as procedural challenges.  He was agreeable to this.  He just was eager to have his hip replaced.  Consent was obtained for benefit of pain relief.  PROCEDURE IN DETAIL:  The patient was brought to the operative theater. Once adequate anesthesia, preoperative antibiotics, 3 g of Ancef administered, he was positioned into the left lateral decubitus position with the right side up.  The right lower extremity was prepped and draped in a sterile fashion.  Time-out was performed identifying the patient, planned procedure, and extremity.  Incision was made over the posterior aspect of the trochanter.  Soft tissue dissection was carried down to the iliotibial band and gluteal fascia.  This was incised for posterior approach to the hip.  Following the exposure of the posterior aspect of the hip, the short external rotators and piriformis were taken down with the capsule in a single layer.  Joint effusion was noted.  Once the hip joint was exposed posteriorly, I was able to dislocate the hip.  A neck osteotomy was made through the trochanteric  fossa. Attention was first directed to the femoral preparation with the femoral retractors placed.  The proximal femur was opened with the drill, hand reamed, and irrigated to try to prevent fat emboli.  Using a starting Chili Pepper broach, we broached the proximal femur from this Chili Pepper to a size 7 broach.  With the proximal femur prepared, I packed sponge in this and I attended to the acetabulum.  Femur was retracted anteriorly.  Retractors placed.  The patient was noted to have fairly significant degenerative changes involving the posterior aspect of the acetabulum.  We re-centered his acetabulum into an anatomic position and reamed up from 47 reamer to a 55  reamer with good bone bed preparation. We selected the 56 mm pinnacle cup, this was utilized and a curved impactor was impacted, it was positioned at about 40 degrees of abduction and 20 degrees of forward flexion, covering the anterior rim.  At this point, I elected to do a trial reduction with a trial liner in place.  The 7 broach was then re-impacted to the level of the neck cut followed by utilization of the high offset neck, and a 36+ 1.5 ball, found the combined anteversion to be about 50 degrees.  The hip was stable without evidencing instability with forward flexion, internal rotation to 80 degrees or with extension and external rotation.  There was no evidence of any significant Shuck.  As compared to his down left leg, his leg lengths appeared to be equal at this point.  Trial components were thus removed.  A single screw was placed into the acetabulum with a hole eliminator placed.  The final 36+ 4 neutral liner for the 56 cup was then impacted.  The final size 7 high Tri-Lock stem was opened and impacted at the level of the neck cut and based on the trial reduction, 36+ 1.5 ball was selected.  This was impacted onto the clean and dry trunnion.  The hip was reduced.  We irrigated the hip throughout the case again at this point.  I reapproximated the posterior capsule.  The remainder of the wound was closed in layers with #1 Vicryl, #0 V-Loc suture on the iliotibial band and gluteal fascia.  The remainder of the wound was closed with 2-0 Vicryl and running 4-0 Monocryl.  The hip was cleaned, dried, and dressed sterilely using surgical glue and Aquacel dressing.  The patient was awoken from his anesthetic and brought to the recovery room in stable condition, tolerating the procedure well.     Madlyn Frankel Charlann Boxer, M.D.     MDO/MEDQ  D:  07/14/2014  T:  07/15/2014  Job:  [id number]

## 2014-07-15 NOTE — Progress Notes (Signed)
     Subjective: 1 Day Post-Op Procedure(s) (LRB): RIGHT TOTAL HIP ARTHROPLASTY (Right)   Patient reports pain as mild, pain controlled.  Feels that the pain is much better than it was prior to surgery.  Some issue with pain with the foley until it was taken out, no issues with urination since.  Ready to be discharged home.  Objective:   VITALS:   Filed Vitals:   07/15/14 0637  BP: 117/76  Pulse: 54  Temp: 98.1 F (36.7 C)  Resp: 16    Dorsiflexion/Plantar flexion intact Incision: dressing C/D/I No cellulitis present Compartment soft  LABS  Recent Labs  07/15/14 0503  HGB 12.9*  HCT 40.5  WBC 12.0*  PLT 183     Recent Labs  07/15/14 0503  NA 137  K 4.1  BUN 16  CREATININE 0.97  GLUCOSE 137*     Assessment/Plan: 1 Day Post-Op Procedure(s) (LRB): RIGHT TOTAL HIP ARTHROPLASTY (Right) Foley cath d/c'ed Advance diet Up with therapy D/C IV fluids Discharge home with home health [facility] Follow up with OLIN,Verdelle Valtierra D in 2 weeks.  Contact information:  [facility] [address] [place] [id number] [phone]    Morbid Obesity (BMI >40)  Estimated body mass index is 43.91 kg/(m^2) as calculated from the following:   Height as of this encounter: 5\' 10"  (1.778 m).   Weight as of this encounter: 138.801 kg (306 lb). Patient also counseled that weight may inhibit the healing process Patient counseled that losing weight will help with future health issues      William AuerbachMatthew S. Jelicia Cameron   PAC  07/15/2014, 9:05 AM

## 2014-07-15 NOTE — Evaluation (Signed)
Occupational Therapy Evaluation Patient Details Name: William MylarMichael E Bowerman MRN: [id number] DOB: Feb 01, 1951 Today's Date: 07/15/2014    History of Present Illness Pt is s/p R THA   Clinical Impression   Pt seen for ADL including toilet transfer, LB dressing and safety with walker during ADL. Wife present for session. Discussed options for tub transfer and pt states 3in1 will fit into tub facing outward to where he can sit back on 3in1 and leave R LE outside of the tub. If any concerns, inquire with HH and let HH address. Advised pt that if he wants AE, he would need extra long reacher and shoe horn (explained where to obtain) but pt states wife will assist with LB dressing. All education completed. No further questions or concerns.     Follow Up Recommendations  No OT follow up;Supervision/Assistance - 24 hour    Equipment Recommendations  None recommended by OT    Recommendations for Other Services       Precautions / Restrictions Precautions Precautions: Posterior Hip Precaution Comments: Reviewed all hip precautions with pt/wife Restrictions Weight Bearing Restrictions: No Other Position/Activity Restrictions: WBAT      Mobility Bed Mobility               General bed mobility comments: in chair  Transfers Overall transfer level: Needs assistance Equipment used: Rolling walker (2 wheeled) Transfers: Sit to/from Stand Sit to Stand: Min guard         General transfer comment: verbal cues for safety and THPs.     Balance                                            ADL Overall ADL's : Needs assistance/impaired Eating/Feeding: Independent;Sitting   Grooming: Wash/dry hands;Set up;Sitting   Upper Body Bathing: Set up;Sitting   Lower Body Bathing: Moderate assistance;Sit to/from stand   Upper Body Dressing : Set up;Sitting   Lower Body Dressing: Moderate assistance;Sit to/from stand   Toilet Transfer: Min guard;Ambulation;BSC;RW    Toileting- ArchitectClothing Manipulation and Hygiene: Min guard;Sit to/from stand         General ADL Comments: Pt has AE from his wife's previous surgery. Pt attempted to use reacher to doff sock, but regular reacher length is not long enough as pt is over 6 feet and he would exceed 90 degrees at the hip to reach all the way to feet with reacher. Explained options of extra long reacher and extra long shoe horn and where to obtain. Pt states his wife can just help with LB dressing and she states this is ok. She assisted pt to don shorts over LEs during session and explained she needs to bring them up to his knees before he attempts to reach for them. Pt stood to pull up shorts and did well. Practiced on and off 3in1 with min cues to back all the way to 3in1 before letting go of walker. He has a tub and wife states the 3in1 will fit into tub side ways (facing outside of tub) and she used it this way after her knee surgery. Explained technique for safely transfering on and off 3in1 with it facing out of tub and to leave the R LE OUTSIDE the tub and bring L LE into tub if desired in order to adhere to hip precautions. Also explained option of stepping over tub to stand and shower when  a little stronger and that [facility] can assess tub transfer when ready.      Vision     Perception     Praxis      Pertinent Vitals/Pain Pain Assessment: 0-10 Pain Score: 6  Pain Location: R hip/buttock (with activity) Pain Descriptors / Indicators: Aching Pain Intervention(s): Repositioned;Ice applied     Hand Dominance     Extremity/Trunk Assessment Upper Extremity Assessment Upper Extremity Assessment: Overall WFL for tasks assessed           Communication Communication Communication: No difficulties   Cognition Arousal/Alertness: Awake/alert Behavior During Therapy: WFL for tasks assessed/performed Overall Cognitive Status: Within Functional Limits for tasks assessed                     General  Comments       Exercises       Shoulder Instructions      Home Living Family/patient expects to be discharged to:: Private residence Living Arrangements: Spouse/significant other Available Help at Discharge: Family Type of Home: House             Bathroom Shower/Tub: Engineer, civil (consulting) Toilet: Handicapped height     Home Equipment: Bedside commode;Adaptive equipment Adaptive Equipment: Reacher;Sock aid;Long-handled shoe horn;Long-handled sponge        Prior Functioning/Environment Level of Independence: Needs assistance    ADL's / Homemaking Assistance Needed: pt reports wife helps with LB self care        OT Diagnosis: Generalized weakness   OT Problem List:     OT Treatment/Interventions:      OT Goals(Current goals can be found in the care plan section) Acute Rehab OT Goals Patient Stated Goal: home OT Goal Formulation: With patient  OT Frequency:     Barriers to D/C:            Co-evaluation              End of Session Equipment Utilized During Treatment: Rolling walker  Activity Tolerance: Patient tolerated treatment well Patient left: in chair;with call bell/phone within reach   Time: 1008-1035 OT Time Calculation (min): 27 min Charges:  OT General Charges $OT Visit: 1 Procedure OT Evaluation $Initial OT Evaluation Tier I: 1 Procedure OT Treatments $Therapeutic Activity: 8-22 mins G-Codes:    Lennox Laity  [phone] 07/15/2014, 11:03 AM

## 2014-07-15 NOTE — Evaluation (Signed)
Physical Therapy Evaluation Patient Details Name: William Cameron MRN: [id number] DOB: Sep 02, 1950 Today's Date: 07/15/2014   History of Present Illness  Pt is s/p R THA  Clinical Impression  Pt s/p R THR presents with decreased R LE strength/ROM, posterior THP and post op pain limiting functional mobility.  Pt currently mobilizing at supervision level and eager for dc to home.    Follow Up Recommendations Home health PT    Equipment Recommendations  None recommended by PT    Recommendations for Other Services OT consult     Precautions / Restrictions Precautions Precautions: Posterior Hip Precaution Booklet Issued: Yes (comment) Precaution Comments: Reviewed all hip precautions with pt/wife Restrictions Weight Bearing Restrictions: No Other Position/Activity Restrictions: WBAT      Mobility  Bed Mobility Overal bed mobility: Needs Assistance Bed Mobility: Supine to Sit;Sit to Supine     Supine to sit: Supervision Sit to supine: Supervision   General bed mobility comments: in chair  Transfers Overall transfer level: Needs assistance Equipment used: Rolling walker (2 wheeled) Transfers: Sit to/from Stand Sit to Stand: Min guard         General transfer comment: verbal cues for safety and THPs.   Ambulation/Gait Ambulation/Gait assistance: Min guard;Supervision Ambulation Distance (Feet): 200 Feet Assistive device: Rolling walker (2 wheeled) Gait Pattern/deviations: Step-to pattern;Step-through pattern;Decreased step length - right;Decreased step length - left;Shuffle;Trunk flexed     General Gait Details: cues for posture, position from RW and initial sequence  Stairs Stairs: Yes Stairs assistance: Min assist Stair Management: No rails;One rail Left;Step to pattern;Forwards;With walker;With crutches Number of Stairs: 8 General stair comments: cues for sequence and foot/RW/crutch placement.  single step fwd with RW and 7 stairs with rail and crutch.   Written information provided  Wheelchair Mobility    Modified Rankin (Stroke Patients Only)       Balance                                             Pertinent Vitals/Pain Pain Assessment: 0-10 Pain Score: 6  Pain Location: R hip/buttock (with activity) Pain Descriptors / Indicators: Aching Pain Intervention(s): Repositioned;Ice applied    Home Living Family/patient expects to be discharged to:: Private residence Living Arrangements: Spouse/significant other Available Help at Discharge: Family Type of Home: House Home Access: Stairs to enter Entrance Stairs-Rails: Left Entrance Stairs-Number of Steps: 1+2 Home Layout: One level Home Equipment: Bedside commode;Adaptive equipment      Prior Function Level of Independence: Needs assistance      ADL's / Homemaking Assistance Needed: pt reports wife helps with LB self care        Hand Dominance   Dominant Hand: Right    Extremity/Trunk Assessment   Upper Extremity Assessment: Overall WFL for tasks assessed           Lower Extremity Assessment: RLE deficits/detail RLE Deficits / Details: 3-/5 hip strength with AAROM at hip to 90 flex and 20 abd    Cervical / Trunk Assessment: Normal  Communication   Communication: No difficulties  Cognition Arousal/Alertness: Awake/alert Behavior During Therapy: WFL for tasks assessed/performed Overall Cognitive Status: Within Functional Limits for tasks assessed                      General Comments      Exercises Total Joint Exercises Ankle Circles/Pumps: AROM;15 reps;Both;Supine Quad Sets:  AROM;Both;10 reps;Supine Heel Slides: AAROM;Right;15 reps;Supine Hip ABduction/ADduction: AAROM;Right;10 reps;Supine      Assessment/Plan    PT Assessment Patient needs continued PT services  PT Diagnosis Difficulty walking   PT Problem List Decreased strength;Decreased range of motion;Decreased activity tolerance;Decreased mobility;Decreased  knowledge of use of DME;Pain;Obesity  PT Treatment Interventions DME instruction;Gait training;Stair training;Functional mobility training;Therapeutic activities;Therapeutic exercise;Patient/family education   PT Goals (Current goals can be found in the Care Plan section) Acute Rehab PT Goals Patient Stated Goal: home PT Goal Formulation: All assessment and education complete, DC therapy    Frequency 7X/week   Barriers to discharge        Co-evaluation               End of Session Equipment Utilized During Treatment: Gait belt Activity Tolerance: Patient tolerated treatment well Patient left: in chair;with call bell/phone within reach;with family/visitor present Nurse Communication: Mobility status         Time: 1610-9604 PT Time Calculation (min) (ACUTE ONLY): 40 min   Charges:   PT Evaluation $Initial PT Evaluation Tier I: 1 Procedure PT Treatments $Gait Training: 8-22 mins $Therapeutic Exercise: 8-22 mins   PT G Codes:        Elaine Roanhorse 07-30-2014, 12:33 PM

## 2014-07-20 NOTE — Discharge Summary (Signed)
Physician Discharge Summary  Patient ID: CORTLAN DOLIN MRN: [id number] DOB/AGE: 05/10/1950 64 y.o.  Admit date: 07/14/2014 Discharge date: 07/15/2014   Procedures:  Procedure(s) (LRB): RIGHT TOTAL HIP ARTHROPLASTY (Right)  Attending Physician:  Dr. Durene Romans   Admission Diagnoses:   Right hip primary OA / pain  Discharge Diagnoses:  Principal Problem:   S/P right THA, AA Active Problems:   Morbid obesity  Past Medical History  Diagnosis Date  . Diabetes mellitus without complication   . Hypertension   . COPD (chronic obstructive pulmonary disease)   . 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   Marland Kitchen PONV (postoperative nausea and vomiting)     post op nausea  . Bronchitis     chronic bronchitis    HPI:    William Cameron, 64 y.o. male, has a history of pain and functional disability in the right hip(s) due to arthritis and patient has failed non-surgical conservative treatments for greater than 12 weeks to include NSAID's and/or analgesics, corticosteriod injections, use of assistive devices and activity modification. Onset of symptoms was gradual starting ~1 years ago with rapidlly worsening course since that time.The patient noted no past surgery on the right hip(s). Patient currently rates pain in the right hip at 9 out of 10 with activity. Patient has night pain, worsening of pain with activity and weight bearing, trendelenberg gait, pain that interfers with activities of daily living and pain with passive range of motion. Patient has evidence of periarticular osteophytes and joint space narrowing by imaging studies. This condition presents safety issues increasing the risk of falls. There is no current active infection. Risks, benefits and expectations were discussed with the patient. Risks including but not limited to the risk of anesthesia, blood clots, nerve  damage, blood vessel damage, failure of the prosthesis, infection and up to and including death. Patient understand the risks, benefits and expectations and wishes to proceed with surgery.   PCP: Ralene Ok, MD   Discharged Condition: good  Hospital Course:  Patient underwent the above stated procedure on 07/14/2014. Patient tolerated the procedure well and brought to the recovery room in good condition and subsequently to the floor.  POD #1 BP: 117/76 ; Pulse: 54 ; Temp: 98.1 F (36.7 C) ; Resp: 16 Patient reports pain as mild, pain controlled. Feels that the pain is much better than it was prior to surgery. Some issue with pain with the foley until it was taken out, no issues with urination since. Ready to be discharged home. Dorsiflexion/plantar flexion intact, incision: dressing C/D/I, no cellulitis present and compartment soft.   LABS  Basename    HGB  12.9  HCT  40.5    Discharge Exam: General appearance: alert, cooperative and no distress Extremities: Homans sign is negative, no sign of DVT, no edema, redness or tenderness in the calves or thighs and no ulcers, gangrene or trophic changes  Disposition: Home with follow up in 2 weeks   Follow-up Information    Follow up with Shelda Pal, MD. Schedule an appointment as soon as possible for a visit in 2 weeks.   Specialty:  Orthopedic Surgery   Contact information:   [address] [place] [address] [phone]       Discharge Instructions    Call MD / Call 911    Complete by:  As directed  If you experience chest pain or shortness of breath, CALL 911 and be transported to the hospital emergency room.  If you develope a fever above 101 F, pus (white drainage) or increased drainage or redness at the wound, or calf pain, call your surgeon's office.     Change dressing    Complete by:  As directed   Maintain surgical dressing until follow up in the clinic. If the edges start to pull up, may  reinforce with tape. If the dressing is no longer working, may remove and cover with gauze and tape, but must keep the area dry and clean.  Call with any questions or concerns.     Constipation Prevention    Complete by:  As directed   Drink plenty of fluids.  Prune juice may be helpful.  You may use a stool softener, such as Colace (over the counter) 100 mg twice a day.  Use MiraLax (over the counter) for constipation as needed.     Diet - low sodium heart healthy    Complete by:  As directed      Discharge instructions    Complete by:  As directed   Maintain surgical dressing until follow up in the clinic. If the edges start to pull up, may reinforce with tape. If the dressing is no longer working, may remove and cover with gauze and tape, but must keep the area dry and clean.  [facility]. Call with any questions or concerns.     Increase activity slowly as tolerated    Complete by:  As directed      TED hose    Complete by:  As directed   Use stockings (TED hose) for 2 weeks on both leg(s).  You may remove them at night for sleeping.     Weight bearing as tolerated    Complete by:  As directed   Laterality:  right  Extremity:  Lower             Medication List    STOP taking these medications        diclofenac 75 MG EC tablet  Commonly known as:  VOLTAREN     HYDROcodone-acetaminophen 10-325 MG per tablet  Commonly known as:  NORCO      TAKE these medications        acetaminophen 325 MG tablet  Commonly known as:  TYLENOL  Take 2 tablets (650 mg total) by mouth every 6 (six) hours as needed for mild pain (or Fever >/= 101).     amLODipine 5 MG tablet  Commonly known as:  NORVASC  Take 5 mg by mouth every morning.     aspirin 325 MG EC tablet  Take 1 tablet (325 mg total) by mouth 2 (two) times daily.     benazepril 40 MG tablet  Commonly known as:  LOTENSIN  Take 20 mg by mouth every morning.     clindamycin 150 MG capsule    Commonly known as:  CLEOCIN  Take 150 mg by mouth 3 (three) times daily.     docusate sodium 100 MG capsule  Commonly known as:  COLACE  Take 1 capsule (100 mg total) by mouth 2 (two) times daily.     Empagliflozin-Linagliptin 10-5 MG Tabs  Take 1 tablet by mouth daily.     FARXIGA 5 MG Tabs tablet  Generic drug:  dapagliflozin propanediol  Take 1 tablet by mouth daily.  Fenofibrate Micronized 90 MG Caps  Take 1 tablet by mouth daily.     ferrous sulfate 325 (65 FE) MG tablet  Take 1 tablet (325 mg total) by mouth 3 (three) times daily after meals.     gabapentin 300 MG capsule  Commonly known as:  NEURONTIN  Take 2 capsules by mouth 2 (two) times daily.     methocarbamol 500 MG tablet  Commonly known as:  ROBAXIN  Take 1 tablet (500 mg total) by mouth every 6 (six) hours as needed for muscle spasms.     oxyCODONE 5 MG immediate release tablet  Commonly known as:  Oxy IR/ROXICODONE  Take 1-3 tablets (5-15 mg total) by mouth every 4 (four) hours as needed for severe pain.     polyethylene glycol packet  Commonly known as:  MIRALAX / GLYCOLAX  Take 17 g by mouth 2 (two) times daily.         Signed: [name]. Briggs Edelen   PA-C  07/20/2014, 9:22 AM

## 2014-10-05 ENCOUNTER — Ambulatory Visit (INDEPENDENT_AMBULATORY_CARE_PROVIDER_SITE_OTHER): Payer: Self-pay | Admitting: Physician Assistant

## 2014-10-05 VITALS — BP 108/74 | HR 71 | Temp 98.5°F | Resp 16 | Ht 72.0 in | Wt 307.6 lb

## 2014-10-05 DIAGNOSIS — E1165 Type 2 diabetes mellitus with hyperglycemia: Secondary | ICD-10-CM

## 2014-10-05 DIAGNOSIS — IMO0002 Reserved for concepts with insufficient information to code with codable children: Secondary | ICD-10-CM

## 2014-10-05 DIAGNOSIS — Z021 Encounter for pre-employment examination: Secondary | ICD-10-CM

## 2014-10-05 DIAGNOSIS — R81 Glycosuria: Secondary | ICD-10-CM

## 2014-10-05 DIAGNOSIS — E1149 Type 2 diabetes mellitus with other diabetic neurological complication: Secondary | ICD-10-CM | POA: Insufficient documentation

## 2014-10-05 DIAGNOSIS — Z024 Encounter for examination for driving license: Secondary | ICD-10-CM

## 2014-10-05 DIAGNOSIS — E1141 Type 2 diabetes mellitus with diabetic mononeuropathy: Secondary | ICD-10-CM

## 2014-10-05 DIAGNOSIS — I10 Essential (primary) hypertension: Secondary | ICD-10-CM | POA: Insufficient documentation

## 2014-10-05 LAB — GLUCOSE, POCT (MANUAL RESULT ENTRY): POC GLUCOSE: 108 mg/dL — AB (ref 70–99)

## 2014-10-05 LAB — POCT GLYCOSYLATED HEMOGLOBIN (HGB A1C): HEMOGLOBIN A1C: 6

## 2014-10-05 NOTE — Progress Notes (Signed)
Subjective:    Patient ID: William Cameron, male    DOB: Oct 31, 1950, 64 y.o.   MRN: [id number]  Chief Complaint  Patient presents with  . Employment Physical    DOT    Medications, allergies, past medical history, surgical history, family history, social history and problem list reviewed and updated.  HPI  [age] yom presents for DOT card.   He typically gets one year cards. He last got a one year card that is not yet expired but had a right hip replacement 12 wks ago. His employer requires him to get another card since this procedure.   PMH: DMII: On glyxambi and farxiga. 2000 sugar noted in urine today. BG 108 today and A1C 6.0. Pt denies any fainting, dizziness, loss of consic, seizure. He has lost 100# lbs in past 13 months with diet and exercise routine.  HTN: Well controlled on lotensin and norvasc.  OSA c CPAP: Brings usage report with him today from 4/16 - 7/16 which shows 99% usage.   Review of Systems No cp, sob.     Objective:   Physical Exam  Constitutional: He is oriented to person, place, and time. He appears well-developed and well-nourished.  Non-toxic appearance. He does not have a sickly appearance. He does not appear ill. No distress.  BP 108/74 mmHg  Pulse 71  Temp(Src) 98.5 F (36.9 C) (Oral)  Resp 16  Ht 6' (1.829 m)  Wt 307 lb 9.6 oz (139.526 kg)  BMI 41.71 kg/m2  SpO2 98%   HENT:  Right Ear: Tympanic membrane normal.  Left Ear: Tympanic membrane normal.  Mouth/Throat: Uvula is midline, oropharynx is clear and moist and mucous membranes are normal.  Eyes: Conjunctivae and EOM are normal. Pupils are equal, round, and reactive to light.  Neck: Trachea normal and normal range of motion.  Cardiovascular: Normal rate, regular rhythm and normal heart sounds.   Pulses:      Posterior tibial pulses are 2+ on the right side, and 2+ on the left side.  Pulmonary/Chest: Effort normal and breath sounds normal. No tachypnea.  Musculoskeletal:       Right hip: He  exhibits normal range of motion, normal strength, no tenderness, no bony tenderness and no swelling.  Neurological: He is alert and oriented to person, place, and time. He has normal strength. No cranial nerve deficit or sensory deficit. He displays a negative Romberg sign. Coordination and gait normal.  3/6 monofilament testing RLE, 2/6 monofilament testing LLE.   Psychiatric: He has a normal mood and affect. His speech is normal and behavior is normal.   Results for orders placed or performed in visit on 10/05/14  POCT glucose (manual entry)  Result Value Ref Range   POC Glucose 108 (A) 70 - 99 mg/dl  POCT glycosylated hemoglobin (Hb A1C)  Result Value Ref Range   Hemoglobin A1C 6.0       Assessment & Plan:   Encounter for Department of Transportation (DOT) examination for driving license renewal  Glucosuria - Plan: POCT glucose (manual entry), POCT glycosylated hemoglobin (Hb A1C)  Essential hypertension  DM (diabetes mellitus), type 2, uncontrolled w/neurologic complication --one year card given due to well controlled htn, well controlled dm2 (a1c 6.0 with no hypoglycemic episodes), osa with good cpap usage, and 12 wk s/p right hip replacement with good function today --discussed importance of good bg control to limit progression of neuropathy which would restrict future licensure  Donnajean Lopesodd M. Gaius Ishaq, PA-C Physician Assistant-Certified [facility]  10/05/2014 5:20 PM

## 2014-10-22 ENCOUNTER — Ambulatory Visit (INDEPENDENT_AMBULATORY_CARE_PROVIDER_SITE_OTHER): Payer: 59 | Admitting: Neurology

## 2014-10-22 ENCOUNTER — Encounter: Payer: Self-pay | Admitting: Neurology

## 2014-10-22 VITALS — BP 122/82 | HR 68 | Resp 20 | Ht 72.0 in | Wt 311.0 lb

## 2014-10-22 DIAGNOSIS — G4733 Obstructive sleep apnea (adult) (pediatric): Secondary | ICD-10-CM | POA: Diagnosis not present

## 2014-10-22 DIAGNOSIS — E662 Morbid (severe) obesity with alveolar hypoventilation: Secondary | ICD-10-CM | POA: Diagnosis not present

## 2014-10-22 DIAGNOSIS — Z9989 Dependence on other enabling machines and devices: Secondary | ICD-10-CM

## 2014-10-22 NOTE — Patient Instructions (Signed)
Dear [name],   William Cameron will have an overnight pulse-oximetry at home, while using CPAP.   [name].   [name], MD

## 2014-10-22 NOTE — Progress Notes (Signed)
[name]  Provider:  [name], M D  Referring Provider: Ralene Ok, MD Primary Care Physician:  Ralene Ok, MD  Chief Complaint  Patient presents with  . Follow-up    cpap, rm 11, with wife    HPI:  William Cameron is a 64 y.o. caucasian , married , right handed male , who is seen here as a referral from Dr. 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   [name]  underwent a sleep study on 09-07-13 based on his neck circumference of 20 inches and is BMI of 48.1 his blood pressure prestudy was normal. However the sleep study revealed an AHI of 49.9 RDI of 63.2 and the patient slept not even 1 minute at night on his back these apnea in the please were obtained by the patient slept on his side and prone .  The lowest oxygen saturation was 80% was 55.9 minutes of desaturation time in total. Was critical hypoxemia. The patient also had some PACs and PVCs during his sleep study,  while he was titrated to 11 cm water.  We obtained a download through [facility] , his DME , auto-titrated between 8 and 14 cm water:  the patient's 95th percentile is 13 cm water his AHI is 1.0. He had 100% compliance his average user time was 4 hours and 44 minutes at night.  We obtained a second in office download dated 10-21-13, 97% compliance 4 hours and 44 minutes 95th percentile of pressure was 12.8 cm pressure. He has now been using CPAP over 30 days with high compliance and feels improvement. Epworth 1 .  DOT driver with severe OSA, and hypoxemia-  Patient responded excellent to CPAP at 13 cm water, by 955 pressure of auto titration. ONO was not longer suggestive of hyoxemia while on CPAP.  Interval history from 7-20 8-16. William Cameron is here for her regular revisit he still is not exactly in love with the CPAP but he certainly has used the oxygen concentrator overnight he never had any need for the portable oxygen tanks to be used. He has since his last visit lost  over the 5 pounds no longer short of breath had hip surgery and is able to exercise walk and work outside. His physical activity level is much increased. Questions if he still needs oxygen at home and it's possible that he does not. He is using his CPAP and he has 100% compliance over the last 30 days and 100% of these days over 4 hours of continued use the residual AHI is 0.5 which is excellent as an out set machine between 8 and 14 cm water with 3 cm EPR and the 91st percentile pressure used is 11.6. Repeat not to make any adjustments for the CPAP device he underwent a pulse oximetry in 2015 which documented hypoxemia. I would like to repeat repeat the finger clip at night on CPAP without oxygen and see if he still needs oxygen after this massive weight loss. The obesity hypoventilation diagnosis would be possibly removed from his chart given that his BMI has shrunken. During a recent DOT related exam he was told that his hematocrit is very high. I am concerned that this may mean that he is low used to lower oxygen levels. However it is worth rechecking his nocturnal oxygenation.          Last visit notes:  The patient reports that he had recently his usual physical evacuation with Dr. Channing Mutters  Ludwig Clarks and that he was given a clean bill of health. However when he had to go to his occupational related exam his neck circumference was measured and exceeded over 17 inches - thus he was considered at high risk for sleep apnea.  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 He does have irregular work hours. 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 His daily limits can be up to 600 miles.  He has a history of diabetes mellitus type 2 and high cholesterol he also has hypertension which is being considered  well controlled.  He had carried a diagnoses of coronary artery disease after cardiac cath no intervention was applied. He has never been a smoker of there is no COPD or emphysema history. He does not remarkable and she denies using any illicit drugs.  He stated that he does not have a "sleeping  Problem", nor wheezing or coughing no heart murmur dyspnea or orthopnea. He denies any chest pains. He does have nocturia. Is that he has normal appetite. No headaches no dizziness and no fainting spells. His wife does note some snoring, but has not witnessed apnea. He has 3-4 nocturia spells a night. Between him and his wife sleeps their Boxer dog. His bedroom is cool , quiet and dark, air conditioning , fan running. He prefers 9 PM as his bedtime.  He watches TV in the living room, transfers to bed, falls asleep promptly -he wakes up at 5.30 every morning of the week. No alarm needed. He rarely naps in daytime.  He has coffee in the morning. He may nap after a night work shift- prefers to sleep in a recliner.   He has no family history of a sleep disorder. He had a tonsillectomy and adenoidectomy in childhood, 73. 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    Review of Systems: Out of a complete 14 system review, the patient complains of only the following symptoms, and all other reviewed systems are negative. Epworth 2, FSS 10, he didn't repeat the GDS score.    History   Social History  . Marital Status: Married    Spouse Name: William Cameron  . Number of Children: 1  . Years of Education: 12+   Occupational History  .     Social History Main Topics  . Smoking status: Never Smoker   . Smokeless tobacco: Never Used  . Alcohol Use: No  . Drug Use: No  . Sexual Activity: Not on file   Other Topics Concern  . Not on file   Social History Narrative   Patient is married William Cameron) and lives at home with his wife.   Patient has one adult child.   Patient is working full-time.   Patient has a high school and auto  classes.   Patient is right-handed.   Patient drinks two cups of coffee every morning and very little tea.    Family History  Problem Relation Age of Onset  . Prostate cancer Father 85  . Diabetes type II Mother     Past Medical History  Diagnosis Date  . Diabetes mellitus without complication   . Hypertension   . COPD (chronic obstructive pulmonary disease)   . 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   Marland Kitchen PONV (postoperative nausea and vomiting)     post op nausea  . Bronchitis     chronic bronchitis    Past Surgical History  Procedure Laterality Date  . Hernia repair    . Joint replacement      rt knee  . Hydrocele excision / repair    . Rotator cuff repair  rt  . Colonoscopy  02/02/2012    Procedure: COLONOSCOPY;  Surgeon: Theda Belfast, MD;  Location: [facility] ENDOSCOPY;  Service: Endoscopy;  Laterality: N/A;  . Total hip arthroplasty Right 07/14/2014    Procedure: RIGHT TOTAL HIP ARTHROPLASTY;  Surgeon: Durene Romans, MD;  Location: [facility] ORS;  Service: Orthopedics;  Laterality: Right;    Current Outpatient Prescriptions  Medication Sig Dispense Refill  . benazepril (LOTENSIN) 40 MG tablet Take 20 mg by mouth every morning.     . [employer] ASPIRIN EC 325 MG EC tablet Take 325 mg by mouth 2 (two) times daily.  0  . diclofenac (VOLTAREN) 75 MG EC tablet Take 75 mg by mouth 2 (two) times daily.    . Empagliflozin-Linagliptin (GLYXAMBI PO) Take 5 mg by mouth.    Marland Kitchen FARXIGA 5 MG TABS Take 1 tablet by mouth daily.     . Fenofibric Acid 105 MG TABS Take 1 tablet by mouth daily.  5  . gabapentin (NEURONTIN) 300 MG capsule Take 2 capsules by mouth 2 (two) times daily.      No current facility-administered medications for this visit.    Allergies as of 10/22/2014 - Review Complete 10/22/2014  Allergen Reaction Noted  . Penicillins Anaphylaxis 02/02/2012  . Tetanus toxoids  Swelling 02/02/2012  . Sulfa antibiotics Swelling 02/02/2012    Vitals: BP 122/82 mmHg  Pulse 68  Resp 20  Ht 6' (1.829 m)  Wt 311 lb (141.069 kg)  BMI 42.17 kg/m2 Last Weight:  Wt Readings from Last 1 Encounters:  10/22/14 311 lb (141.069 kg)   Last Height:   Ht Readings from Last 1 Encounters:  10/22/14 6' (1.829 m)    Physical exam:  General: The patient is awake, alert and appears not in acute distress. Head: Normocephalic, atraumatic. Neck is supple. Mallampati 4, neck circumference: 20.25 - poor dentition.  Cardiovascular:  Regular rate and rhythm , without  murmurs or carotid bruit, and without distended neck veins. Respiratory: Lungs are clear to auscultation. Skin:  Without evidence of edema, or rash Trunk: BMI is highly elevated,  But BMI is now 42 from over 50 . The  patient  has normal posture.still abdominal obesity  Neurologic exam : The patient is awake and alert, oriented to place and time.  Memory subjective described as intact. There is a normal attention span & concentration ability.  Speech is fluent without  dysarthria, dysphonia or aphasia. Mood and affect are appropriate.  Cranial nerves: Pupils are equal and briskly reactive to light. Hearing to finger rub intact.  Facial sensation intact to fine touch. Facial motor strength is symmetric and tongue and uvula move midline. Motor exam:   Normal tone ,muscle bulk and symmetric normal strength in all extremities. Gait and station:  Waddling gait, wide based. Patient walks without assistive device. Deep tendon reflexes: in the upper and lower extremities are symmetric and intact. Babinski maneuver response is   downgoing.  Assessment:  After physical and neurologic examination, review of laboratory studies, imaging, neurophysiology  testing and pre-existing records, assessment is :  William Cameron has radically changed his lifestyle and nutrition. He now has lost over 70 pounds and is approaching severe obesity but  is no longer considered morbidly obese. He is motivated to continue losing weight further. His CPAP compliance report shows excellent compliance and very good resolution of his apnea. He wonders if he still needs oxygen has recently traveled for couple of days without taking oxygen with him and did well. His exercise tolerance is much increased and a recent hip surgery also allows him to have pain-free exercise. He underwent a overnight pulse oximetry in 2015 in September I would like for him to repeat this to see if we still need oxygen at night goal is to have him use each her CPAP only and not oxygen during the night of the test.

## 2014-12-03 ENCOUNTER — Encounter: Payer: Self-pay | Admitting: Adult Health

## 2014-12-03 ENCOUNTER — Ambulatory Visit (INDEPENDENT_AMBULATORY_CARE_PROVIDER_SITE_OTHER): Payer: 59 | Admitting: Adult Health

## 2014-12-03 DIAGNOSIS — G4733 Obstructive sleep apnea (adult) (pediatric): Secondary | ICD-10-CM

## 2014-12-03 DIAGNOSIS — Z9989 Dependence on other enabling machines and devices: Principal | ICD-10-CM

## 2014-12-03 NOTE — Progress Notes (Signed)
William: William Cameron DOB: [birth year]  REASON FOR VISIT: follow up- OSA on CPAP HISTORY FROM: William  HISTORY OF PRESENT ILLNESS: William Cameron is a 64 year old male with a history of obstructive sleep apnea on CPAP. He returns today for a compliance download. His CPAP indicates that he use his machine 30 out of 30 days for compliance of 100%. His residual AHI is 0.4 on a minimum pressure of 87 cm of water and maximum pressure of 14 cm water with EPR of 3. The William does not have a significant leak. Currently the William is getting supplemental oxygen through his CPAP. At the last visit Dr. Kandyce Rud ordered a pulse oximetry test. [facility] am [facility]. The William however is unsure if he unhooked the oxygen from the CPAP machine. The William would like to discontinue the oxygen if possible. He denies any new symptoms. He has lost a total of 100 pounds. He returns today for an evaluation.   HISTORY  10/22/14 (CD): William Cameron is a 64 y.o. caucasian , married , right handed male , who is seen here as a referral from Dr. 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   William Cameron underwent a sleep study on 09-07-13 based on his neck circumference of 20 inches and is BMI of 48.1 his blood pressure prestudy was normal. However the sleep study revealed an AHI of 49.9 RDI of 63.2 and the William slept not even 1 minute at night on his back these apnea in the please were obtained by the William slept on his side and prone .  The lowest oxygen saturation was 80% was 55.9 minutes of desaturation time in total. Was critical hypoxemia. The William also had some PACs and PVCs during his sleep study, while he was titrated to 11 cm water.  We obtained a download through [facility] , his DME , auto-titrated between 8 and 14 cm water: the William's 95th percentile is 13 cm water his AHI is 1.0. He had 100% compliance his average user time was 4 hours and 44  minutes at night.  We obtained a second in office download dated 10-21-13, 97% compliance 4 hours and 44 minutes 95th percentile of pressure was 12.8 cm pressure. He has now been using CPAP over 30 days with high compliance and feels improvement. Epworth 1 .  DOT driver with severe OSA, and hypoxemia- William responded excellent to CPAP at 13 cm water, by 955 pressure of auto titration. ONO was not longer suggestive of hyoxemia while on CPAP.  Interval history from 7-20 8-16. William Cameron is here for her regular revisit he still is not exactly in love with the CPAP but he certainly has used the oxygen concentrator overnight he never had any need for the portable oxygen tanks to be used. He has since his last visit lost over the 5 pounds no longer short of breath had hip surgery and is able to exercise walk and work outside. His physical activity level is much increased. Questions if he still needs oxygen at home and it's possible that he does not. He is using his CPAP and he has 100% compliance over the last 30 days and 100% of these days over 4 hours of continued use the residual AHI is 0.5 which is excellent as an out set machine between 8 and 14 cm water with 3 cm EPR and the 91st percentile pressure used is 11.6. Repeat not to make  any adjustments for the CPAP device he underwent a pulse oximetry in 2015 which documented hypoxemia. I would like to repeat repeat the finger clip at night on CPAP without oxygen and see if he still needs oxygen after this massive weight loss. The obesity hypoventilation diagnosis would be possibly removed from his chart given that his BMI has shrunken. During a recent DOT related exam he was told that his hematocrit is very high. I am concerned that this may mean that he is low used to lower oxygen levels. However it is worth rechecking his nocturnal oxygenation.  REVIEW OF SYSTEMS: Out of a complete 14 system review of symptoms, the William complains only of the following  symptoms, and all other reviewed systems are negative.  ALLERGIES: Allergies  Allergen Reactions  . Penicillins Anaphylaxis  . Tetanus Toxoids Swelling    Can't breathe  . Sulfa Antibiotics Swelling    Swelling and nausea    HOME MEDICATIONS: Outpatient Prescriptions Prior to Visit  Medication Sig Dispense Refill  . benazepril (LOTENSIN) 40 MG tablet Take 20 mg by mouth every morning.     . [facility] ASPIRIN EC 325 MG EC tablet Take 325 mg by mouth 2 (two) times daily.  0  . diclofenac (VOLTAREN) 75 MG EC tablet Take 75 mg by mouth 2 (two) times daily.    . Empagliflozin-Linagliptin (GLYXAMBI PO) Take 5 mg by mouth.    Marland Kitchen FARXIGA 5 MG TABS Take 1 tablet by mouth daily.     . Fenofibric Acid 105 MG TABS Take 1 tablet by mouth daily.  5  . gabapentin (NEURONTIN) 300 MG capsule Take 2 capsules by mouth 2 (two) times daily.      No facility-administered medications prior to visit.    PAST MEDICAL HISTORY: Past Medical History  Diagnosis Date  . Diabetes mellitus without complication   . Hypertension   . COPD (chronic obstructive pulmonary disease)   . 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   Marland Kitchen PONV (postoperative nausea and vomiting)     post op nausea  . Bronchitis     chronic bronchitis    PAST SURGICAL HISTORY: Past Surgical History  Procedure Laterality Date  . Hernia repair    . Joint replacement      rt knee  . Hydrocele excision / repair    . Rotator cuff repair  rt  . Colonoscopy  02/02/2012    Procedure: COLONOSCOPY;  Surgeon: Theda Belfast, MD;  Location: [facility];  Service: Endoscopy;  Laterality: N/A;  . Total hip arthroplasty Right 07/14/2014    Procedure: RIGHT TOTAL HIP ARTHROPLASTY;  Surgeon: Durene Romans, MD;  Location: [facility];  Service: Orthopedics;  Laterality: Right;    FAMILY HISTORY: Family History  Problem Relation Age of Onset  .  Prostate cancer Father 72  . Diabetes type II Mother     SOCIAL HISTORY: Social History   Social History  . Marital Status: Married    Spouse Name: [name]  . Number of Children: 1  . Years of Education: 12+   Occupational History  .     Social History Main Topics  . Smoking status: Never Smoker   . Smokeless tobacco: Never Used  . Alcohol Use: No  . Drug Use: No  . Sexual Activity: Not on file   Other Topics Concern  . Not on file  Social History Narrative   William is married [name]) and lives at home with his wife.   William has one adult child.   William is working full-time.   William has a high school and auto classes.   William is right-handed.   William drinks two cups of coffee every morning and very little tea.      PHYSICAL EXAM  Filed Vitals:   12/03/14 1604  BP: 123/73  Pulse: 53  Height: 6' (1.829 m)  Weight: 311 lb 8 oz (141.295 kg)   Body mass index is 42.24 kg/(m^2).  Generalized: Well developed, in no acute distress Neck: Circumference 18 inches, Mallampati 3+   Neurological examination  Mentation: Alert oriented to time, place, history taking. Follows all commands speech and language fluent Cranial nerve II-XII: Pupils were equal round reactive to light. Extraocular movements were full, visual field were full on confrontational test. Facial sensation and strength were normal. Uvula tongue midline. Head turning and shoulder shrug  were normal and symmetric. Motor: The motor testing reveals 5 over 5 strength of all 4 extremities. Good symmetric motor tone is noted throughout.  Sensory: Sensory testing is intact to soft touch on all 4 extremities. No evidence of extinction is noted.  Coordination: Cerebellar testing reveals good finger-nose-finger and heel-to-shin bilaterally.  Gait and station: Gait is normal. Tandem gait is normal. Romberg is negative. No drift is seen.  Reflexes: Deep tendon reflexes are symmetric and normal bilaterally.     DIAGNOSTIC DATA (LABS, IMAGING, TESTING) - I reviewed William records, labs, notes, testing and imaging myself where available.  Lab Results  Component Value Date   WBC 12.0* 07/15/2014   HGB 12.9* 07/15/2014   HCT 40.5 07/15/2014   MCV 89.0 07/15/2014   PLT 183 07/15/2014      Component Value Date/Time   NA 137 07/15/2014 0503   K 4.1 07/15/2014 0503   CL 105 07/15/2014 0503   CO2 23 07/15/2014 0503   GLUCOSE 137* 07/15/2014 0503   BUN 16 07/15/2014 0503   CREATININE 0.97 07/15/2014 0503   CALCIUM 9.4 07/15/2014 0503   GFRNONAA 85* 07/15/2014 0503   GFRAA >90 07/15/2014 0503   No results found for: CHOL, HDL, LDLCALC, LDLDIRECT, TRIG, CHOLHDL Lab Results  Component Value Date   HGBA1C 6.0 10/05/2014      ASSESSMENT AND PLAN 64 y.o. year old male  has a past medical history of Diabetes mellitus without complication; Hypertension; COPD (chronic obstructive pulmonary disease); Severe obesity (BMI >= 40) (09/03/2013); PONV (postoperative nausea and vomiting); and Bronchitis. here with:  1. Obstructive sleep apnea on CPAP  The William CPAP download is excellent. The William is unsure if he completed the pulse oximetry test off of oxygen. [facility] have called [facility] and  requested his test results. I will call the William once I have these and we will set up an additional test if needed. William verbalized understanding. He will follow-up in 6 months or sooner if needed.   Butch Penny, MSN, NP-C 12/03/2014, 4:48 PM [name] [address], [address] [place], [place] [address] [phone]

## 2014-12-03 NOTE — Progress Notes (Signed)
I agree with the assessment and plan as directed by NP .The patient is known to me .   Corinne Goucher, MD  

## 2014-12-03 NOTE — Patient Instructions (Signed)
Continue CPAP nightly. We will review pulse oximetry test. I will call you with results.  If your symptoms worsen or you develop new symptoms please let us know.

## 2015-06-02 ENCOUNTER — Encounter: Payer: Self-pay | Admitting: Adult Health

## 2015-06-02 ENCOUNTER — Ambulatory Visit (INDEPENDENT_AMBULATORY_CARE_PROVIDER_SITE_OTHER): Payer: 59 | Admitting: Adult Health

## 2015-06-02 VITALS — BP 114/76 | HR 84 | Resp 16 | Ht 72.0 in | Wt 327.5 lb

## 2015-06-02 DIAGNOSIS — Z9989 Dependence on other enabling machines and devices: Principal | ICD-10-CM

## 2015-06-02 DIAGNOSIS — G4733 Obstructive sleep apnea (adult) (pediatric): Secondary | ICD-10-CM | POA: Diagnosis not present

## 2015-06-02 NOTE — Progress Notes (Signed)
I reviewed above note and agree with the assessment and plan.  Marvel PlanJindong Yostin Malacara, MD PhD Stroke Neurology 06/02/2015 6:12 PM

## 2015-06-02 NOTE — Progress Notes (Signed)
PATIENT: William Cameron DOB: [birth year] Jaquita Rector FOR VISIT: follow up- OSA on CPAP HISTORY FROM: patient  HISTORY OF PRESENT ILLNESS: William Cameron a 65 year old male with a history of obstructive sleep apnea on CPAP. He returns today for a compliance download. His download indicates that he uses machine 29 out of 30 days for compliance of 97%. On average he uses his machine greater than 4 hours 29 out of 30 days for compliance of 97%. On average he uses his machine 4 hours and 27 minutes. His residual AHI is 0.1 on a minimum pressure of 8 cm of water and maximum pressure of 14 cm of water with EPR of 3. There is no significant leak. The patient reports that he does have a difficult time falling asleep with the mask on. The patient is currently using nasal pillows. He states that he does suffer from claustrophobia and feels that this is the reason he has a hard time falling asleep. He denies any new neurological symptoms. He returns today for an evaluation.  12/03/14 (MM):  William Cameron is a 65 year old male with a history of obstructive sleep apnea on CPAP. He returns today for a compliance download. His CPAP indicates that he use his machine 30 out of 30 days for compliance of 100%. His residual AHI is 0.4 on a minimum pressure of 8 cm of water and maximum pressure of 14 cm water with EPR of 3. The patient does not have a significant leak. Currently the patient is getting supplemental oxygen through his CPAP. At the last visit Dr. Kandyce Rud ordered a pulse oximetry test. I am [facility] these [facility]. The patient however is unsure if he unhooked the oxygen from the CPAP machine. The patient would like to discontinue the oxygen if possible. He denies any new symptoms. He has lost a total of 100 pounds. He returns today for an evaluation.   HISTORY DOHMEIER  10/22/14 (CD): William Cameron is a 65 y.o. caucasian , married , right handed male , who is seen here as a referral from Dr.  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   Patient [place] underwent a sleep study on 09-07-13 based on his neck circumference of 20 inches and is BMI of 48.1 his blood pressure prestudy was normal. However the sleep study revealed an AHI of 49.9 RDI of 63.2 and the patient slept not even 1 minute at night on his back these apnea in the please were obtained by the patient slept on his side and prone .  The lowest oxygen saturation was 80% was 55.9 minutes of desaturation time in total. Was critical hypoxemia. The patient also had some PACs and PVCs during his sleep study, while he was titrated to 11 cm water.  We obtained a download through [facility] , his DME , auto-titrated between 8 and 14 cm water: the patient's 95th percentile is 13 cm water his AHI is 1.0. He had 100% compliance his average user time was 4 hours and 44 minutes at night.  We obtained a second in office download dated 10-21-13, 97% compliance 4 hours and 44 minutes 95th percentile of pressure was 12.8 cm pressure. He has now been using CPAP over 30 days with high compliance and feels improvement. Epworth 1 .  DOT driver with severe OSA, and hypoxemia- Patient responded excellent to CPAP at 13 cm water, by 955 pressure of auto titration. ONO was not longer suggestive of hyoxemia while  on CPAP.  Interval history from 7-20 8-16. William Cameron is here for her regular revisit he still is not exactly in love with the CPAP but he certainly has used the oxygen concentrator overnight he never had any need for the portable oxygen tanks to be used. He has since his last visit lost over the 5 pounds no longer short of breath had hip surgery and is able to exercise walk and work outside. His physical activity level is much increased. Questions if he still needs oxygen at home and it's possible that he does not. He is using his CPAP and he has 100% compliance over the last 30 days and 100% of these days over 4 hours of continued  use the residual AHI is 0.5 which is excellent as an out set machine between 8 and 14 cm water with 3 cm EPR and the 91st percentile pressure used is 11.6. Repeat not to make any adjustments for the CPAP device he underwent a pulse oximetry in 2015 which documented hypoxemia. I would like to repeat repeat the finger clip at night on CPAP without oxygen and see if he still needs oxygen after this massive weight loss. The obesity hypoventilation diagnosis would be possibly removed from his chart given that his BMI has shrunken. During a recent DOT related exam he was told that his hematocrit is very high. I am concerned that this may mean that he is low used to lower oxygen levels. However it is worth rechecking his nocturnal oxygenation  REVIEW OF SYSTEMS: Out of a complete 14 system review of symptoms, the patient complains only of the following symptoms, and all other reviewed systems are negative.  ALLERGIES: Allergies  Allergen Reactions  . Penicillins Anaphylaxis  . Tetanus Toxoids Swelling    Can't breathe  . Sulfa Antibiotics Swelling    Swelling and nausea    HOME MEDICATIONS: Outpatient Prescriptions Prior to Visit  Medication Sig Dispense Refill  . benazepril (LOTENSIN) 40 MG tablet Take 20 mg by mouth every morning.     . Empagliflozin-Linagliptin (GLYXAMBI PO) Take 5 mg by mouth.    Marland Kitchen. FARXIGA 5 MG TABS Take 1 tablet by mouth daily.     Marland Kitchen. gabapentin (NEURONTIN) 300 MG capsule Take 2 capsules by mouth 2 (two) times daily.     . [facility] ASPIRIN EC 325 MG EC tablet Take 325 mg by mouth 2 (two) times daily. Reported on 06/02/2015  0  . diclofenac (VOLTAREN) 75 MG EC tablet Take 75 mg by mouth 2 (two) times daily. Reported on 06/02/2015    . Fenofibric Acid 105 MG TABS Take 1 tablet by mouth daily. Reported on 06/02/2015  5   No facility-administered medications prior to visit.    PAST MEDICAL HISTORY: Past Medical History  Diagnosis Date  . Diabetes mellitus without complication (HCC)     . Hypertension   . COPD (chronic obstructive pulmonary disease) (HCC)   . 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   Marland Kitchen. PONV (postoperative nausea and vomiting)     post op nausea  . Bronchitis     chronic bronchitis    PAST SURGICAL HISTORY: Past Surgical History  Procedure Laterality Date  . Hernia repair    . Joint replacement      rt knee  . Hydrocele excision / repair    . Rotator cuff repair  rt  . Colonoscopy  02/02/2012    Procedure: COLONOSCOPY;  Surgeon: Theda Belfast, MD;  Location: [facility];  Service: Endoscopy;  Laterality: N/A;  . Total hip arthroplasty Right 07/14/2014    Procedure: RIGHT TOTAL HIP ARTHROPLASTY;  Surgeon: Durene Romans, MD;  Location: [facility];  Service: Orthopedics;  Laterality: Right;    FAMILY HISTORY: Family History  Problem Relation Age of Onset  . Prostate cancer Father 51  . Diabetes type II Mother     SOCIAL HISTORY: Social History   Social History  . Marital Status: Married    Spouse Name: [name]  . Number of Children: 1  . Years of Education: 12+   Occupational History  .     Social History Main Topics  . Smoking status: Never Smoker   . Smokeless tobacco: Never Used  . Alcohol Use: No  . Drug Use: No  . Sexual Activity: Not on file   Other Topics Concern  . Not on file   Social History Narrative   Patient is married [name]) and lives at home with his wife.   Patient has one adult child.   Patient is working full-time.   Patient has a high school and auto classes.   Patient is right-handed.   Patient drinks two cups of coffee every morning and very little tea.      PHYSICAL EXAM  Filed Vitals:   06/02/15 1542  BP: 114/76  Pulse: 84  Resp: 16  Height: 6' (1.829 m)  Weight: 327 lb 8 oz (148.553 kg)   Body mass index is 44.41 kg/(m^2).  Generalized: Well developed, in no acute  distress  Neck: Circumference 18 inches, Mallampati 4+  Neurological examination  Mentation: Alert oriented to time, place, history taking. Follows all commands speech and language fluent Cranial nerve II-XII: Pupils were equal round reactive to light. Extraocular movements were full, visual field were full on confrontational test. Facial sensation and strength were normal. Uvula tongue midline. Head turning and shoulder shrug  were normal and symmetric. Motor: The motor testing reveals 5 over 5 strength of all 4 extremities. Good symmetric motor tone is noted throughout.  Sensory: Sensory testing is intact to soft touch on all 4 extremities. No evidence of extinction is noted.  Coordination: Cerebellar testing reveals good finger-nose-finger and heel-to-shin bilaterally.  Gait and station: Gait is normal.  Reflexes: Deep tendon reflexes are symmetric and normal bilaterally.   DIAGNOSTIC DATA (LABS, IMAGING, TESTING) - I reviewed patient records, labs, notes, testing and imaging myself where available.  Lab Results  Component Value Date   WBC 12.0* 07/15/2014   HGB 12.9* 07/15/2014   HCT 40.5 07/15/2014   MCV 89.0 07/15/2014   PLT 183 07/15/2014      Component Value Date/Time   NA 137 07/15/2014 0503   K 4.1 07/15/2014 0503   CL 105 07/15/2014 0503   CO2 23 07/15/2014 0503   GLUCOSE 137* 07/15/2014 0503   BUN 16 07/15/2014 0503   CREATININE 0.97 07/15/2014 0503   CALCIUM 9.4 07/15/2014 0503   GFRNONAA 85* 07/15/2014 0503   GFRAA >90 07/15/2014 0503      ASSESSMENT AND PLAN 65 y.o. year old male  has a past medical history of Diabetes mellitus without complication (HCC); Hypertension; COPD (chronic obstructive pulmonary disease) (HCC); Severe obesity (BMI >= 40) (HCC) (09/03/2013); PONV (postoperative nausea and vomiting); and Bronchitis. here with:  1. Obstructive sleep apnea on CPAP  The patient's compliance on CPAP is excellent. He  does have a difficult time sleeping with  the CPAP mask on. I will discuss with Dr. Vickey Huger other possible options for treating his sleep apnea. The patient has also lost 100 pounds since his sleep study. Patient is amenable to this plan. He will follow-up in 6 months with Dr. Vergia Alcon, MSN, NP-C 06/02/2015, 3:51 PM [facility] [address] [place], [place] [address] [phone]

## 2015-06-02 NOTE — Patient Instructions (Signed)
Continue to use CPAP nightly If your symptoms worsen or you develop new symptoms please let us know.   

## 2015-09-27 ENCOUNTER — Ambulatory Visit (INDEPENDENT_AMBULATORY_CARE_PROVIDER_SITE_OTHER): Payer: Self-pay | Admitting: Family Medicine

## 2015-09-27 VITALS — BP 136/82 | HR 74 | Temp 98.7°F | Resp 16 | Ht 70.25 in | Wt 342.8 lb

## 2015-09-27 DIAGNOSIS — Z021 Encounter for pre-employment examination: Secondary | ICD-10-CM

## 2015-09-27 DIAGNOSIS — Z024 Encounter for examination for driving license: Secondary | ICD-10-CM

## 2015-09-27 NOTE — Patient Instructions (Signed)
     IF you received an x-ray today, you will receive an invoice from [facility]. Please contact [facility] at [phone] with questions or concerns regarding your invoice.   IF you received labwork today, you will receive an invoice from [facility]. Please contact [facility] at [phone] with questions or concerns regarding your invoice.   Our billing staff will not be able to assist you with questions regarding bills from these companies.  You will be contacted with the lab results as soon as they are available. The fastest way to get your results is to activate your My Chart account. Instructions are located on the last page of this paperwork. If you have not heard from us regarding the results in 2 weeks, please contact this office.      

## 2015-09-27 NOTE — Progress Notes (Signed)
Commercial Driver Medical Examination   William MylarMichael E Cameron is a 65 y.o. male who presents today for a commercial driver fitness determination physical exam. The patient reports problems - DM, HTN, HPL, and OSA.  Sees PCP Dr. Ludwig ClarksMoreira frequently - every 3 mos.  He reports his last hgba1c was well-controlled and unchanged. Last a1c avail in Epic was 1 yr prior at 6.0.  Very compliant with CPAP - brought in machine today [facility] The following portions of the patient's history were reviewed and updated as appropriate: allergies, current medications, past family history, past medical history, past social history, past surgical history and problem list. Review of Systems Pertinent items are noted in HPI.   Objective:    Vision:  Visual Acuity Screening   Right eye Left eye Both eyes  Without correction:     With correction: 20/20 20/20 20/20   Comments: Peripheral Vision: Right eye 85 degrees. Left eye 85  Degrees.  The patient can distinguish the colors red, amber and green.  Applicant can recognize and distinguish among traffic control signals and devices showing standard red, green, and amber colors.  Applicant meets visual acuity requirement only when wearing corrective lenses.  Monocular Vision?: No   Hearing:  Hearing Screening Comments: The patient was able to hear a forced whisper from 10 feet wearing hearing aids.       BP 136/82 mmHg  Pulse 74  Temp(Src) 98.7 F (37.1 C) (Oral)  Resp 16  Ht 5' 10.25" (1.784 m)  Wt 342 lb 12.8 oz (155.493 kg)  BMI 48.86 kg/m2  SpO2 97%  General Appearance:    Alert, cooperative, no distress, appears stated age  Head:    Normocephalic, without obvious abnormality, atraumatic  Eyes:    PERRL, conjunctiva/corneas clear, EOM's intact, fundi    benign, both eyes       Ears:    Normal TM's and external ear canals, both ears  Nose:   Nares normal, septum midline, mucosa normal, no drainage    or sinus  tenderness  Throat:   Lips, mucosa, and tongue normal; teeth and gums normal  Neck:   Supple, symmetrical, trachea midline, no adenopathy;       thyroid:  No enlargement/tenderness/nodules; no carotid   bruit or JVD  Back:     Symmetric, no curvature, ROM normal, no CVA tenderness  Lungs:     Clear to auscultation bilaterally, respirations unlabored  Chest wall:    No tenderness or deformity  Heart:    Regular rate and rhythm, S1 and S2 normal, no murmur, rub   or gallop  Abdomen:     Soft, non-tender, bowel sounds active all four quadrants,    no masses, no organomegaly        Extremities:   Extremities normal, atraumatic, no cyanosis or edema  Pulses:   2+ and symmetric all extremities  Skin:   Skin color, texture, turgor normal, no rashes or lesions  Lymph nodes:   Cervical, supraclavicular, and axillary nodes normal  Neurologic:   CNII-XII intact. Normal strength, sensation and reflexes      throughout    Labs: Lab Results  Component Value Date   PROTEINUR NEGATIVE 07/08/2014   BILIRUBINUR NEGATIVE 07/08/2014   GLUCOSEU >1000* 07/08/2014   Had sugar in his coffee prior to arrival.  Pt is on Farxiga causing glucosuria   OSA machine evaluated - had datacard - can see that it has data nightly but can't open  files.  However, from nightly logs I feel pretty well assured that he is compliant.   Assessment:    Healthy male exam.  Meets standards, but periodic monitoring required due to HTN, Type 2 DM, HPL.  Driver qualified only for 1 year.   wearing glasses and hearing aide Plan:    Medical examiners certificate completed and printed. Return as needed.  Pt reminded to bring a copy of his last labs - a1c - and printout of the cpap compliance with him to his visit next year. Pt very agreeable.

## 2015-10-12 ENCOUNTER — Encounter: Payer: Self-pay | Admitting: Neurology

## 2015-11-12 ENCOUNTER — Other Ambulatory Visit: Payer: Self-pay | Admitting: Internal Medicine

## 2015-11-12 ENCOUNTER — Ambulatory Visit
Admission: RE | Admit: 2015-11-12 | Discharge: 2015-11-12 | Disposition: A | Discharge status: Home / Self Care | Payer: 59 | Source: Ambulatory Visit | Attending: Internal Medicine | Admitting: Internal Medicine

## 2015-11-12 DIAGNOSIS — R059 Cough, unspecified: Secondary | ICD-10-CM

## 2015-11-12 DIAGNOSIS — R05 Cough: Secondary | ICD-10-CM

## 2015-11-12 DIAGNOSIS — J9801 Acute bronchospasm: Secondary | ICD-10-CM

## 2015-11-12 DIAGNOSIS — I4891 Unspecified atrial fibrillation: Secondary | ICD-10-CM | POA: Diagnosis present

## 2015-12-06 ENCOUNTER — Ambulatory Visit: Payer: 59 | Admitting: Neurology

## 2015-12-08 ENCOUNTER — Ambulatory Visit: Payer: 59 | Admitting: Neurology

## 2015-12-08 ENCOUNTER — Telehealth: Payer: Self-pay

## 2015-12-08 NOTE — Telephone Encounter (Signed)
LM on cell phone to call back. Dr. Vickey Hugerohmeier is out of the office today and need to reschedule appt.  CAlled home number but no VM

## 2015-12-12 ENCOUNTER — Other Ambulatory Visit (HOSPITAL_COMMUNITY): Payer: 59

## 2015-12-12 ENCOUNTER — Emergency Department (HOSPITAL_COMMUNITY): Payer: 59

## 2015-12-12 ENCOUNTER — Emergency Department (HOSPITAL_COMMUNITY)
Admission: EM | Admit: 2015-12-12 | Discharge: 2015-12-12 | Disposition: A | Discharge status: Home / Self Care | Payer: 59 | Attending: Emergency Medicine | Admitting: Emergency Medicine

## 2015-12-12 ENCOUNTER — Encounter (HOSPITAL_COMMUNITY): Payer: Self-pay | Admitting: Emergency Medicine

## 2015-12-12 DIAGNOSIS — I10 Essential (primary) hypertension: Secondary | ICD-10-CM | POA: Diagnosis not present

## 2015-12-12 DIAGNOSIS — J449 Chronic obstructive pulmonary disease, unspecified: Secondary | ICD-10-CM | POA: Insufficient documentation

## 2015-12-12 DIAGNOSIS — Z791 Long term (current) use of non-steroidal anti-inflammatories (NSAID): Secondary | ICD-10-CM | POA: Insufficient documentation

## 2015-12-12 DIAGNOSIS — E119 Type 2 diabetes mellitus without complications: Secondary | ICD-10-CM | POA: Insufficient documentation

## 2015-12-12 DIAGNOSIS — Z79899 Other long term (current) drug therapy: Secondary | ICD-10-CM | POA: Insufficient documentation

## 2015-12-12 DIAGNOSIS — R413 Other amnesia: Secondary | ICD-10-CM | POA: Insufficient documentation

## 2015-12-12 DIAGNOSIS — R4182 Altered mental status, unspecified: Secondary | ICD-10-CM | POA: Diagnosis present

## 2015-12-12 LAB — CBC WITH DIFFERENTIAL/PLATELET
BASOS ABS: 0 10*3/uL (ref 0.0–0.1)
BASOS PCT: 0 %
Eosinophils Absolute: 0.1 10*3/uL (ref 0.0–0.7)
Eosinophils Relative: 1 %
HEMATOCRIT: 50.9 % (ref 39.0–52.0)
Hemoglobin: 16.7 g/dL (ref 13.0–17.0)
LYMPHS PCT: 14 %
Lymphs Abs: 1.2 10*3/uL (ref 0.7–4.0)
MCH: 29.2 pg (ref 26.0–34.0)
MCHC: 32.8 g/dL (ref 30.0–36.0)
MCV: 89 fL (ref 78.0–100.0)
MONOS PCT: 7 %
Monocytes Absolute: 0.6 10*3/uL (ref 0.1–1.0)
NEUTROS ABS: 6.5 10*3/uL (ref 1.7–7.7)
Neutrophils Relative %: 78 %
Platelets: 172 10*3/uL (ref 150–400)
RBC: 5.72 MIL/uL (ref 4.22–5.81)
RDW: 13.3 % (ref 11.5–15.5)
WBC: 8.4 10*3/uL (ref 4.0–10.5)

## 2015-12-12 LAB — I-STAT TROPONIN, ED: Troponin i, poc: 0 ng/mL (ref 0.00–0.08)

## 2015-12-12 LAB — URINE MICROSCOPIC-ADD ON
BACTERIA UA: NONE SEEN
RBC / HPF: NONE SEEN RBC/hpf (ref 0–5)
Squamous Epithelial / LPF: NONE SEEN
WBC, UA: NONE SEEN WBC/hpf (ref 0–5)

## 2015-12-12 LAB — URINALYSIS, ROUTINE W REFLEX MICROSCOPIC
Bilirubin Urine: NEGATIVE
Glucose, UA: >1000 mg/dL — AB
Hgb urine dipstick: NEGATIVE
Ketones, ur: NEGATIVE mg/dL
LEUKOCYTES UA: NEGATIVE
Nitrite: NEGATIVE
PROTEIN: NEGATIVE mg/dL
SPECIFIC GRAVITY, URINE: 1.04 — AB (ref 1.005–1.030)
pH: 5.5 (ref 5.0–8.0)

## 2015-12-12 LAB — BASIC METABOLIC PANEL
ANION GAP: 8 (ref 5–15)
BUN: 19 mg/dL (ref 6–20)
CO2: 23 mmol/L (ref 22–32)
Calcium: 9.7 mg/dL (ref 8.9–10.3)
Chloride: 106 mmol/L (ref 101–111)
Creatinine, Ser: 1.32 mg/dL — ABNORMAL HIGH (ref 0.61–1.24)
GFR calc Af Amer: >60 mL/min (ref >60)
GFR, EST NON AFRICAN AMERICAN: 55 mL/min — AB (ref >60)
GLUCOSE: 159 mg/dL — AB (ref 65–99)
Potassium: 4.3 mmol/L (ref 3.5–5.1)
Sodium: 137 mmol/L (ref 135–145)

## 2015-12-12 MED ORDER — LORAZEPAM 2 MG/ML IJ SOLN: 1.0000 mg | Freq: Once | INTRAMUSCULAR | Status: DC

## 2015-12-12 MED ORDER — LORAZEPAM 1 MG PO TABS: 1.0000 mg | ORAL_TABLET | Freq: Once | ORAL | Status: DC | PRN

## 2015-12-12 NOTE — ED Provider Notes (Signed)
Emergency Department Provider Note   I have reviewed the triage vital signs and the nursing notes.   HISTORY  Chief Complaint Memory Loss   HPI William Cameron is a 65 y.o. male with PMH of COPD, DM, and HTN presents emergency department for evaluation of episode of amnesia this morning with repetitive questioning and altered mental status. Patient denies any history of similar episodes. He states over the last several weeks he's felt like something is "very wrong with him" but has difficulty explaining exactly what. 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   This morning family stated that he was asking questions over and over again. He seemed to be confused. He was able to identify family members but was unable to remember the answers to questions. Patient does not recall any of the events this morning with any detail. He denies any chest pain, difficulty breathing. No abdominal discomfort. Patient denies any unilateral weakness or numbness. No voice changes. No prior history of stroke.  Past Medical History:  Diagnosis Date  . Bronchitis    chronic bronchitis  . COPD (chronic obstructive pulmonary disease) (HCC)   . Diabetes mellitus without complication (HCC)   . Hypertension   . PONV (postoperative nausea and vomiting)    post op nausea  . 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     Patient Active Problem List   Diagnosis Date Noted  . Essential hypertension 10/05/2014  . DM (diabetes mellitus), type 2, uncontrolled w/neurologic complication (HCC) 10/05/2014  . Morbid obesity (HCC) 07/15/2014  . 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 OSA on CPAP 10/21/2013  . Obesity with sleep apnea 10/21/2013  . Severe obesity (BMI >= 40) (HCC) 09/03/2013    Past Surgical History:  Procedure Laterality Date  . COLONOSCOPY  02/02/2012     Procedure: COLONOSCOPY;  Surgeon: Theda Belfast, MD;  Location: [facility];  Service: Endoscopy;  Laterality: N/A;  . HERNIA REPAIR    . HYDROCELE EXCISION / REPAIR    . JOINT REPLACEMENT     rt knee  . ROTATOR CUFF REPAIR  rt  . TOTAL HIP ARTHROPLASTY Right 07/14/2014   Procedure: RIGHT TOTAL HIP ARTHROPLASTY;  Surgeon: Durene Romans, MD;  Location: [facility];  Service: Orthopedics;  Laterality: Right;    Current Outpatient Rx  . Order #: [id number] Class: Historical Med  . Order #: [id number] Class: Historical Med  . Order #: [id number] Class: Historical Med  . Order #: [id number] Class: Historical Med  . Order #: [id number] Class: Historical Med    Allergies Penicillins; Tetanus toxoids; and Sulfa antibiotics  Family History  Problem Relation Age of Onset  . Diabetes type II Mother   . Prostate cancer Father 5    Social History Social History  Substance Use Topics  . Smoking status: Never Smoker  . Smokeless tobacco: Never Used  . Alcohol use No    Review of Systems  Constitutional: No fever/chills Eyes: No visual changes. ENT: No sore throat. Cardiovascular: Denies chest pain. Respiratory: Denies shortness of breath. Gastrointestinal: No abdominal pain.  No nausea, no vomiting.  No diarrhea.  No constipation. Genitourinary: Negative for dysuria. Musculoskeletal: Negative for back pain. Skin: Negative for rash. Neurological: Negative for headaches, focal weakness or numbness. Transient confusion and repetitive questioning of family today.   10-point ROS otherwise negative.  ____________________________________________  PHYSICAL EXAM:  VITAL SIGNS: ED Triage Vitals  Enc Vitals Group     BP 12/12/15 0727 167/96     Pulse Rate 12/12/15 0727 63     Resp 12/12/15 0727 20     Temp 12/12/15 0727 98.1 F (36.7 C)     Temp Source 12/12/15 0727 Oral     SpO2 12/12/15 0727 95 %     Weight 12/12/15 0729 (!) 350 lb (158.8 kg)     Height 12/12/15 0729 6' (1.829 m)     Constitutional: Alert and oriented. Well appearing and in no acute distress. Eyes: Conjunctivae are normal.  Head: Atraumatic. Nose: No congestion/rhinnorhea. Mouth/Throat: Mucous membranes are moist.  Oropharynx non-erythematous. Neck: No stridor. Cardiovascular: Normal rate, regular rhythm. Good peripheral circulation. Grossly normal heart sounds.   Respiratory: Normal respiratory effort.  No retractions. Lungs CTAB. Gastrointestinal: Soft and nontender. Obese abdomen.  Musculoskeletal: No lower extremity tenderness nor edema. No gross deformities of extremities. Neurologic:  Normal speech and language. No gross focal neurologic deficits are appreciated. No CN deficit 2-12. Normal gait.  Skin:  Skin is warm, dry and intact. No rash noted.   ____________________________________________   LABS (all labs ordered are listed, but only abnormal results are displayed)  Labs Reviewed  BASIC METABOLIC PANEL - Abnormal; Notable for the following:       Result Value   Glucose, Bld 159 (*)    Creatinine, Ser 1.32 (*)    GFR calc non Af Amer 55 (*)    All other components within normal limits  URINALYSIS, ROUTINE W REFLEX MICROSCOPIC (NOT AT [facility]) - Abnormal; Notable for the following:    Specific Gravity, Urine 1.040 (*)    Glucose, UA >1000 (*)    All other components within normal limits  CBC WITH DIFFERENTIAL/PLATELET  URINE MICROSCOPIC-ADD ON  I-STAT TROPOININ, ED   ____________________________________________  EKG   EKG Interpretation  Date/Time:  Sunday December 12 2015 08:47:24 EDT Ventricular Rate:  67 PR Interval:    QRS Duration: 100 QT Interval:  387 QTC Calculation: 409 R Axis:   -81 Text Interpretation:  Atrial fibrillation Incomplete RBBB and LAFB Low voltage, extremity and precordial leads Consider anterior infarct Baseline wander in lead(s) II III aVF No STEMI.  Improved artifact.  Confirmed by Amarie Tarte MD, Alfonsa Vaile [id number]) on 12/12/2015 8:51:06 AM       ____________________________________________  RADIOLOGY  Ct Head Wo Contrast  Result Date: 12/12/2015 CLINICAL DATA:  65 year old male with acute confusion and memory loss. EXAM: CT HEAD WITHOUT CONTRAST TECHNIQUE: Contiguous axial images were obtained from the base of the skull through the vertex without intravenous contrast. COMPARISON:  12/20/2007 head CT FINDINGS: Brain: No evidence of acute infarction, hemorrhage, hydrocephalus, extra-axial collection or mass lesion/mass effect. Mild generalized cerebral volume loss and chronic small-vessel white matter ischemic changes again noted. Vascular: Intracranial atherosclerotic calcifications noted. Skull: Normal. Negative for fracture or focal lesion. Sinuses/Orbits: No acute finding. Other: None. IMPRESSION: No evidence of acute intracranial abnormality. Mild atrophy and chronic small-vessel white matter ischemic changes. Electronically Signed   By: Harmon PierJeffrey  Hu M.D.   On: 12/12/2015 09:15    ____________________________________________   PROCEDURES  Procedure(s) performed:   Procedures  None ____________________________________________   INITIAL IMPRESSION / ASSESSMENT AND PLAN / ED COURSE  Pertinent labs & imaging results that were available during my care of the patient were reviewed by me and considered in my medical decision making (see chart for details).  Patient resents to the emergency department  for evaluation of altered mental status, repetitive questioning, and confusion this morning. Symptoms resolved after approximately one hour. No focal neurological deficits on exam. The patient is awake and alert now. Family at bedside are able to describe the events in detail. Unclear if this transient global amnesia, early onset vascular dementia, or possible TIA. Plan for CT scan of the head and lab work. Will discuss with Neurology.   09:49 AM Spoke with neurology recommends MRI. Suspect TGA. If MRI is negative patient will  follow with his primary care physician.   11:37 AM Delma Villalva discussion with patient. He is very claustrophobic and states he cannot tolerate a conventional MRI. Radiology technician tells me that because of his girth he would not fit in the [facility] and would need to be transferred to an outside hospital. I discussed with the patient in detail that I'm concerned for transient global amnesia but we cannot rule out TIA. I reported the failure to diagnose TIA could lead to additional larger strokes that could be potentially life-threatening. I encouraged the patient to allow for transfer to [facility] to have an MRI done today in a machine that could accommodate him but he refused. He states that he will see his primary doctor tomorrow and schedule an MRI as an outpatient in an open machine. Attempted to call [facility] for open MRI scheduling but scheduler not available. He understands the risks and benefits of this decision to to undergo MRI today. The patient's wife was present during the discussion. I discussed that if he has any additional symptoms he should return to the emergency department immediately. Gave Neurology contact information and strict return precautions.  ____________________________________________  FINAL CLINICAL IMPRESSION(S) / ED DIAGNOSES  Final diagnoses:  Memory loss     MEDICATIONS GIVEN DURING THIS VISIT:  None  NEW OUTPATIENT MEDICATIONS STARTED DURING THIS VISIT:  None   Note:  This document was prepared using Dragon voice recognition software and may include unintentional dictation errors.  Alona Bene, MD Emergency [facility]   Maia Plan, MD 12/12/15 [phone]

## 2015-12-12 NOTE — ED Triage Notes (Signed)
Patient woke up this morning and "can't remember anything from the last few months." Patient would ask family member a questions and a few minutes later ask them the same question again. Patient alert and oriented x4. Patient ambulatory from triage. No neuro deficits noted during triage.

## 2015-12-12 NOTE — ED Notes (Signed)
Bed: [id number] Expected date: 12/12/15 Expected time:  Means of arrival:  Comments:

## 2015-12-12 NOTE — ED Notes (Signed)
MD at bedside. 

## 2015-12-12 NOTE — ED Notes (Signed)
PT DISCHARGED. INSTRUCTIONS GIVEN. AAOX4. PT IN NO APPARENT DISTRESS OR PAIN. THE OPPORTUNITY TO ASK QUESTIONS WAS PROVIDED. 

## 2015-12-12 NOTE — ED Notes (Signed)
Pt states he was feeling anxious and now feeling better and does not wish to have Ativan if he is not going to go for MRI today from ED.

## 2015-12-12 NOTE — Discharge Instructions (Signed)
You were seen in the ED today with confusion at home. We are not sure what is causing your symptoms. We were unable to perform an MRI today and offered [facility]. You will need an urgent MRI. Call your PCP and the Neurologist in the morning to discuss outpatient MRI.   Return to the ED with any weakness, numbness, voice changes, or headache.

## 2015-12-12 NOTE — ED Notes (Signed)
Patient transported to CT 

## 2015-12-15 ENCOUNTER — Other Ambulatory Visit: Payer: Self-pay | Admitting: Internal Medicine

## 2015-12-15 DIAGNOSIS — R413 Other amnesia: Secondary | ICD-10-CM

## 2015-12-15 DIAGNOSIS — I4891 Unspecified atrial fibrillation: Secondary | ICD-10-CM

## 2016-01-17 ENCOUNTER — Telehealth: Payer: Self-pay

## 2016-01-17 NOTE — Telephone Encounter (Signed)
I spoke to pt and advised him that his appt on 01/17/2016 needs to be r/s because Dr. Vickey Hugerohmeier cannot see pt on that day. Pt is agreeable to a 01/24/2016 appt at 8:00. Pt verbalized understanding of new appt date and time.

## 2016-01-20 ENCOUNTER — Ambulatory Visit: Payer: 59 | Admitting: Neurology

## 2016-01-23 NOTE — H&P (Signed)
OFFICE VISIT NOTES COPIED TO EPIC FOR DOCUMENTATION  . History of Present Illness William Cameron(Bridgette Allison AGNP-C; 01/11/2016 1:38 PM) The patient is a 65 year old male who presents for a Follow-up for Atrial fibrillation. He has a history of hyperlipidemia, diabetes, hypertension, morbid obesity with obstructive sleep apnea, and mild nonobstructive CAD by coronary angiogram in 2009. He presented to the emergency department on 11/12/2015 with sudden onset amnesia. Symptoms persisted for approximately 3 hours and then resolved while at the hospital. He was diagnosed with global transient amnesia after head CT was negative. Unable to do MRI due to severe claustrophobia and weight exceeding capacity of open MRI machine. EKG during ER visit revealed atrial fibrillation, however no mention of this was made. He followed up with his PCP several days later who noted atrial fibrillation on review of hospital records and repeat EKG at PCP office revealed persistent A. fib. He was started on Eliquis and referred here for further evaluation and management.  He reported a marked change in symptoms over the previous 1-2 months, complaining of fatigue and dyspnea on exertion. He denies any chest pain, palpitations, PND, orthopnea, edema, dizziness, syncope, or symptoms suggestive of claudication or TIA. He was scheduled for nuclear stress test and echocardiogram and presents today for follow-up.   Problem List/Past Medical Chase Caller(Tiffany Thorpe; 01/11/2016 12:47 PM) New onset atrial fibrillation (I48.91)  CHA2DS2-VASc Score is 4 with yearly risk of stroke of 4.0%. HAS-Bled score is 0 and estimated major bleeding in one year is 0.6-1.13%. Echocardiogram 01/04/2016: Left ventricle cavity is normal in size. Mild to moderate concentric hypertrophy of the left ventricle. Normal global wall motion. Unable to evaluate diastolic function due to A. Fibrillation. Calculated EF 64%. Left atrial cavity is moderately dilated at 4.8 cm.  Right atrial cavity is mildly dilated. Right ventricle is not well visualized. The cavity is mildly dilated. Normal right ventricular function. A moderateor band is noted at the RV apex (normal variant). Mild tricuspid regurgitation. No evidence of pulmonary hypertension. Exercise sestamibi stress test 12/27/2015: 1. Two day protocol followed. The resting electrocardiogram demonstrated normal sinus rhythm, incomplete RBBB and no resting arrhythmias. Low voltage and poor R progression. The stress electrocardiogram was normal. Patient exercised on Bruce protocol for 4:13 minutes and achieved 4.84 METS. Stress test terminated due to % MPHR achieved (Target HR >85%). Symptoms included dyspnea. 2. The perfusion imaging study demonstrates diaphragmatic attenuation artifact in the inferior wall. There is no demonstrable ischemia or scar. LV systolic function was normal at 64%. This is a low risk study. Mild CAD (I25.10)  Coronary angiogram 2009: Mild CAD in the proximal LAD and proximal RCA, LVEF 65%. Hypertensive heart disease without HF (heart failure) (I11.9)  Labwork  12/03/2015: creatinine 1.28, potassium 3.8, CBC normal, total cholesterol 213, triglycerides 136, HDL 50, direct LDL 155, HbA1c 7.1% Controlled type 2 diabetes mellitus without complication, without long-term current use of insulin (E11.9)  Mixed hyperlipidemia (E78.2)  OSA (obstructive sleep apnea) (G47.33)  CKD (chronic kidney disease) (N18.9)   Allergies (Tiffany Fenton [place]; 01/11/2016 12:53 PM) Penicillins  Swelling. Sulfa Drugs  Nausea, Shortness of breath. Lipitor *ANTIHYPERLIPIDEMICS*  Pravastatin Sodium *ANTIHYPERLIPIDEMICS*  Tetanus Toxoid Adsorbed *TOXOIDS*  Crestor *ANTIHYPERLIPIDEMICS*  Chest pain, Shortness of breath.  Family History Chase Caller(Tiffany Thorpe; 01/11/2016 12:47 PM) Mother  Living; Hx of Mini Strokes; No known Heart conditions Father  Deceased. at age 65 from Prostate Cancer; No known Heart  conditions Brother 1  2 Yrs Older; No Known Heart conditions  Social History Chase Caller(Tiffany Thorpe;  21-Jan-2016 12:47 PM) Current tobacco use  Never smoker. Non Drinker/No Alcohol Use  Marital status  Married. Number of Children  1. Living Situation  Lives with spouse.  Past Surgical History Chase Caller; 01-21-16 12:47 PM) Hernia Repair 1990 Hydrocele Surgery 1990 Rotator Cuff Repair - Right 2009 Knee Replacement, Total 2011 Right. Hip Replacement - Right 2016  Medication History Chase Caller; 01-21-16 12:56 PM) Crestor (20MG  Tablet, 1 (one) Tablet Oral daily, Taken starting 12/15/2015) Discontinued: Adverse Reaction. Metoprolol Succinate ER (50MG  Tablet ER 24HR, 1 (one) Tablet Oral daily, Taken starting 12/14/2015) Active. Eliquis (5MG  Tablet, 1 Oral two times daily) Active. Farxiga (5MG  Tablet, 1 Oral daily) Active. Benazepril HCl (20MG  Tablet, 1 Oral daily) Active. Fibricor (105MG  Tablet, 1 Oral daily) Active. Ibuprofen (200MG  Tablet, 1 Oral daily) Active. Vitamin D3 (2000UNIT Tablet, 1 Oral daily) Active. Ventolin HFA (108 (90 Base)MCG/ACT Aerosol Soln, 2 puffs Inhalation as needed) Active. Medications Reconciled (verbally w/ pt)  Diagnostic Studies History Chase Caller; 01/21/2016 12:47 PM) Echocardiogram 01/04/2016 Left ventricle cavity is normal in size. Mild to moderate concentric hypertrophy of the left ventricle. Normal global wall motion. Unable to evaluate diastolic function due to A. Fibrillation. Calculated EF 64%. Left atrial cavity is moderately dilated at 4.8 cm. Right atrial cavity is mildly dilated. Right ventricle is not well visualized. The cavity is mildly dilated. Normal right ventricular function. A moderateor band is noted at the RV apex (normal variant). Mild tricuspid regurgitation. No evidence of pulmonary hypertension. Nuclear stress test 12/27/2015 1. Two day protocol followed. The resting electrocardiogram demonstrated normal  sinus rhythm, incomplete RBBB and no resting arrhythmias. Low voltage and poor R progression. The stress electrocardiogram was normal. Patient exercised on Bruce protocol for 4:13 minutes and achieved 4.84 METS. Stress test terminated due to % MPHR achieved (Target HR >85%). Symptoms included dyspnea. 2. The perfusion imaging study demonstrates diaphragmatic attenuation artifact in the inferior wall. There is no demonstrable ischemia or scar. LV systolic function was normal at 64%. This is a low risk study. Labwork  12/03/2015: creatinine 1.28, potassium 3.8, CBC normal, total cholesterol 213, triglycerides 136, HDL 50, direct LDL 155, HbA1c 7.1%    Review of Systems (Bridgette Allison AGNP-C; 21-Jan-2016 1:39 PM) General Present- Fatigue and Weight Loss > 10lbs. (intentional). Not Present- Anorexia and Fever. Respiratory Present- Decreased Exercise Tolerance and Difficulty Breathing on Exertion. Not Present- Cough. Cardiovascular Not Present- Chest Pain, Claudications, Edema, Orthopnea, Palpitations and Paroxysmal Nocturnal Dyspnea. Gastrointestinal Not Present- Black, Tarry Stool, Change in Bowel Habits and Nausea. Neurological Not Present- Focal Neurological Symptoms and Syncope. Endocrine Not Present- Cold Intolerance, Excessive Sweating, Heat Intolerance and Thyroid Problems. Hematology Not Present- Anemia, Easy Bruising, Petechiae and Prolonged Bleeding.  Vitals Chase Caller; 2016/01/21 12:59 PM) Jan 21, 2016 12:47 PM Weight: 320.31 lb Height: 72in Body Surface Area: 2.6 m Body Mass Index: 43.44 kg/m  Pulse: 66 (Regular)  P.OX: 95% (Room air) BP: 118/80 (Sitting, Left Arm, Standard)       Physical Exam (Bridgette Revonda Standard, AGNP-C; 2016-01-21 3:58 PM) General Mental Status-Alert. General Appearance-Cooperative, Appears stated age, Not in acute distress. Build & Nutrition-Well built and Morbidly obese.  Head and Neck Neck -Note: Short neck and difficult to  evaluate JVD.  Thyroid Gland Characteristics - no palpable nodules, no palpable enlargement.  Cardiovascular Cardiovascular examination reveals -normal heart sounds, regular rate and rhythm with no murmurs. Inspection Jugular vein - Right - No Distention.  Abdomen Inspection Contour - Obese and Pannus present. Palpation/Percussion Normal exam - Non Tender and No  hepatosplenomegaly. Auscultation Normal exam - Bowel sounds normal.  Peripheral Vascular Lower Extremity Inspection - Bilateral - Inspection Normal. Palpation - Edema - Bilateral - No edema. Femoral pulse - Bilateral - Feeble(Pulsus difficult to feel due to patient's bodily habitus.), No Bruits. Popliteal pulse - Bilateral - Feeble(Pulsus difficult to feel due to patient's bodily habitus.). Dorsalis pedis pulse - Bilateral - Normal. Posterior tibial pulse - Bilateral - Normal. Carotid arteries - Bilateral-No Carotid bruit.  Neurologic Neurologic evaluation reveals -alert and oriented x 3 with no impairment of recent or remote memory. Motor-Grossly intact without any focal deficits.  Musculoskeletal Global Assessment Left Lower Extremity - normal range of motion without pain. Right Lower Extremity - normal range of motion without pain.    Assessment & Plan (Bridgette Revonda Standard AGNP-C; 01/11/2016 1:42 PM) New onset atrial fibrillation (I48.91) Story: CHA2DS2-VASc Score is 4 with yearly risk of stroke of 4.0%. HAS-Bled score is 0 and estimated major bleeding in one year is 0.6-1.13%.  Echocardiogram 01/04/2016: Left ventricle cavity is normal in size. Mild to moderate concentric hypertrophy of the left ventricle. Normal global wall motion. Unable to evaluate diastolic function due to A. Fibrillation. Calculated EF 64%. Left atrial cavity is moderately dilated at 4.8 cm. Right atrial cavity is mildly dilated. Right ventricle is not well visualized. The cavity is mildly dilated. Normal right ventricular function. A  moderateor band is noted at the RV apex (normal variant). Mild tricuspid regurgitation. No evidence of pulmonary hypertension.  Exercise sestamibi stress test 12/27/2015: 1. Two day protocol followed. The resting electrocardiogram demonstrated normal sinus rhythm, incomplete RBBB and no resting arrhythmias. Low voltage and poor R progression. The stress electrocardiogram was normal. Patient exercised on Bruce protocol for 4:13 minutes and achieved 4.84 METS. Stress test terminated due to % MPHR achieved (Target HR >85%). Symptoms included dyspnea. 2. The perfusion imaging study demonstrates diaphragmatic attenuation artifact in the inferior wall. There is no demonstrable ischemia or scar. LV systolic function was normal at 64%. This is a low risk study. Impression: EKG 01/11/2016: Atrial fibrillation with controlled ventricular response at a rate of 60 bpm, leftward axis, cannot exclude inferior infarct old. Poor R-wave progression, cannot exclude anterolateral infarct old. Low voltage complexes. Pulmonary disease pattern. Diffuse nonspecific T-wave abnormality. No change from EKG on 12/14/2015. Current Plans Complete electrocardiogram (93000) Mild CAD (I25.10) Story: Coronary angiogram 2009: Mild CAD in the proximal LAD and proximal RCA, LVEF 65%. Hypertensive heart disease without HF (heart failure) (I11.9) Labwork Story: 12/03/2015: creatinine 1.28, potassium 3.8, CBC normal, total cholesterol 213, triglycerides 136, HDL 50, direct LDL 155, HbA1c 7.1% OSA (obstructive sleep apnea) (G47.33) Current Plans Mechanism of underlying disease process and action of medications discussed with the patient. I discussed primary/secondary prevention and also dietary counseling was done. He presents for follow-up of echocardiogram and nuclear stress test due to new onset atrial fibrillation. Stress test was negative for evidence of ischemia with normal LVEF and echocardiogram revealed mild to moderate LVH with  moderately dilated LA, but was otherwise normal. Patient continues to complain of marked fatigue and dyspnea with even minimal amounts of exertion, likely secondary to atrial fibrillation. Schedule for Direct current cardioversion. I have discussed regarding risks and benefits of rate control vs rhythm control with the patient. Patient understands risk of cardiac arrest and need for CPR, aspiration pneumonia, but not limited to these. Patient is willing.  He was unable to tolerate Crestor due to worsening symptoms of dyspnea and fatigue. Will reattempt to initiate statin therapy after  cardioversion and improvement in symptoms. He has made significant dietary changes and has lost 13 pounds in weight since his last visit. Blood pressure is also very well controlled today. Follow-up after cardioversion for evaluation and further recommendations.    Signed by William SalvoBridgette Allison, AGNP-C (01/11/2016 3:59 PM)

## 2016-01-24 ENCOUNTER — Encounter: Payer: Self-pay | Admitting: Neurology

## 2016-01-24 ENCOUNTER — Ambulatory Visit (INDEPENDENT_AMBULATORY_CARE_PROVIDER_SITE_OTHER): Payer: 59 | Admitting: Neurology

## 2016-01-24 VITALS — BP 118/82 | HR 64 | Resp 20 | Ht 72.0 in | Wt 328.0 lb

## 2016-01-24 DIAGNOSIS — G4700 Insomnia, unspecified: Secondary | ICD-10-CM

## 2016-01-24 DIAGNOSIS — Z9989 Dependence on other enabling machines and devices: Secondary | ICD-10-CM

## 2016-01-24 DIAGNOSIS — G4733 Obstructive sleep apnea (adult) (pediatric): Secondary | ICD-10-CM

## 2016-01-24 DIAGNOSIS — I4891 Unspecified atrial fibrillation: Secondary | ICD-10-CM | POA: Diagnosis not present

## 2016-01-24 DIAGNOSIS — G4730 Sleep apnea, unspecified: Secondary | ICD-10-CM

## 2016-01-24 NOTE — Progress Notes (Signed)
PATIENT: Shirlean MylarMichael E Barbier DOB: 02/04/1951 Jaquita Rector/REASON FOR VISIT: follow up- OSA on CPAP HISTORY FROM: patient  HISTORY OF PRESENT ILLNESS: CD 04-26-2015  Mr. Charyl DancerGant is a 65 year old male with a history of obstructive sleep apnea on CPAP.  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 His family had noted him to ask irrelevant questions and seemingly being confused. He is amnestic for the event. He states that when [facility] and arrived in front of the building he asked his family was going to the hospital? Once worked up by cardiology it was clear that he suffered atrial fibrillation and at this time is still in atrial fibrillation. His cardiologist is preparing for a cardioversion procedure tomorrow, 01/25/2016 ( Halloween ! ). He was diagnosed as having suffered a light stroke ( CT was normal, no MRI done, No residual symptoms at this time - TIA ?)  with no motor weakness or sensory loss he was able to walk, his speech had changed. He was"talking out of his head" as he stated. He has a definite ongoing high compliance with CPAP, is one of my most adherent patients to this form of therapy is 97% compliance, a residual AHI of 0.1 using an AutoSet the 95th percentile is 13.3 cm water pressure.  No changes are necessary at this time. What concerns me, is Mr. Dorena BodoGant's report that he uses a CPAP he doesn't sleep well on it. His cardiologist and I have urged him to be compliant and so he is following our advice but he feels that his sleep quality has not improved with CPAP even if his apnea count is significantly decreased.  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    His CPAP  download indicates that he uses  his machine 4 hours and 33 minutes. His residual AHI is 0.1 on a minimum pressure of 8 cm of water and maximum pressure of 14 cm of water  with EPR of 3. There is no significant leak.  The patient reports that he does have a difficult time falling asleep with the mask on. The patient is currently using nasal pillows. He states that he does suffer from claustrophobia and feels that this is the reason he has a hard time falling asleep. He uses only small nasal pillows, with very little interface space.  12/03/14 (MM):  Mr. Charyl DancerGant is a 65 year old male with a history of obstructive sleep apnea on CPAP. He returns today for a compliance download. His CPAP indicates that he use his machine 30 out of 30 days for compliance of 100%. His residual AHI is 0.4 on a minimum pressure of 8 cm of water and maximum pressure of 14 cm water with EPR of 3. The patient does not have a significant leak. Currently the patient is getting supplemental oxygen through his CPAP. At the last visit Dr. Kandyce Rudomeier ordered a pulse oximetry test. I am requesting these results from [facility]. The patient however is unsure if he unhooked the oxygen from the CPAP machine. The patient would like to discontinue the oxygen if possible. He denies any new symptoms. He has lost a total of 100 pounds. He returns today for an evaluation.   HISTORY Zendayah Hardgrave  10/22/14 (CD): Shirlean MylarMichael E Lanz is a 65 y.o. caucasian , married , right handed male , who is seen here  as a referral from Dr. 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   Patient Coulthard underwent a sleep study on 09-07-13 based on his neck circumference of 20 inches and is BMI of 48.1 his blood pressure prestudy was normal. However the sleep study revealed an AHI of 49.9 RDI of 63.2 and the patient slept not even 1 minute at night on his back these apnea in the please were obtained by the patient slept on his side and prone .  The lowest oxygen saturation was 80% was 55.9 minutes of desaturation time in total. Was critical hypoxemia. The patient also had some PACs and PVCs during his sleep study, while  he was titrated to 11 cm water.  We [facility] , his D[facility] , auto-titrated between 8 and 14 cm water: the patient's 95th percentile is 13 cm water his AHI is 1.0. He had 100% compliance his average user time was 4 hours and 44 minutes at night.  We obtained a second in office download dated 10-21-13, 97% compliance 4 hours and 44 minutes 95th percentile of pressure was 12.8 cm pressure. He has now been using CPAP over 30 days with high compliance and feels improvement. Epworth 1 .  DOT driver with severe OSA, and hypoxemia- Patient responded excellent to CPAP at 13 cm water, by 955 pressure of auto titration. ONO was not longer suggestive of hyoxemia while on CPAP.  Interval history from 7-20 8-16. Mr. Charyl DancerGant is here for her regular revisit he still is not exactly in love with the CPAP but he certainly has used the oxygen concentrator overnight he never had any need for the portable oxygen tanks to be used. He has since his last visit lost over the 5 pounds no longer short of breath had hip surgery and is able to exercise walk and work outside. His physical activity level is much increased. Questions if he still needs oxygen at home and it's possible that he does not. He is using his CPAP and he has 100% compliance over the last 30 days and 100% of these days over 4 hours of continued use the residual AHI is 0.5 which is excellent as an out set machine between 8 and 14 cm water with 3 cm EPR and the 91st percentile pressure used is 11.6. Repeat not to make any adjustments for the CPAP device he underwent a pulse oximetry in 2015 which documented hypoxemia. I would like to repeat repeat the finger clip at night on CPAP without oxygen and see if he still needs oxygen after this massive weight loss. The obesity hypoventilation diagnosis would be possibly removed from his chart given that his BMI has shrunken. During a recent DOT related exam he was told that his hematocrit is very high. I am  concerned that this may mean that he is low used to lower oxygen levels. However it is worth rechecking his nocturnal oxygenation  REVIEW OF SYSTEMS: Out of a complete 14 system review of symptoms, the patient complains only of the following symptoms, and all other reviewed systems are negative.  ALLERGIES: Allergies  Allergen Reactions  . Penicillins Anaphylaxis    Has patient had a PCN reaction causing immediate rash, facial/tongue/throat swelling, SOB or lightheadedness with hypotension: Yes Has patient had a PCN reaction causing severe rash involving mucus membranes or skin necrosis: Yes Has patient had a PCN reaction that required hospitalization Yes Has patient had a PCN reaction occurring within the last 10  years: No If all of the above answers are "NO", then may proceed with Cephalosporin use.  . Tetanus Toxoids Swelling    Can't breathe  . Sulfa Antibiotics Swelling    Swelling and nausea    HOME MEDICATIONS: Outpatient Medications Prior to Visit  Medication Sig Dispense Refill  . benazepril (LOTENSIN) 40 MG tablet Take 20 mg by mouth every evening.     . cholecalciferol (VITAMIN D) 1000 units tablet Take 2,000 Units by mouth daily.     Marland Kitchen FARXIGA 5 MG TABS Take 1 tablet by mouth daily.     . Fenofibric Acid 105 MG TABS Take 1 tablet by mouth daily. Reported on 06/02/2015  5  . ibuprofen (ADVIL,MOTRIN) 200 MG tablet Take 400 mg by mouth every 6 (six) hours as needed for mild pain.      No facility-administered medications prior to visit.     PAST MEDICAL HISTORY: Past Medical History:  Diagnosis Date  . Bronchitis    chronic bronchitis  . COPD (chronic obstructive pulmonary disease) (HCC)   . Diabetes mellitus without complication (HCC)   . Hypertension   . PONV (postoperative nausea and vomiting)    post op nausea  . 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 DOT  driver.     PAST SURGICAL HISTORY: Past Surgical History:  Procedure Laterality Date  . COLONOSCOPY  02/02/2012   Procedure: COLONOSCOPY;  Surgeon: Theda Belfast, MD;  Location: [facility] ENDOSCOPY;  Service: Endoscopy;  Laterality: N/A;  . HERNIA REPAIR    . HYDROCELE EXCISION / REPAIR    . JOINT REPLACEMENT     rt knee  . ROTATOR CUFF REPAIR  rt  . TOTAL HIP ARTHROPLASTY Right 07/14/2014   Procedure: RIGHT TOTAL HIP ARTHROPLASTY;  Surgeon: Durene Romans, MD;  Location: [facility] ORS;  Service: Orthopedics;  Laterality: Right;    FAMILY HISTORY: Family History  Problem Relation Age of Onset  . Diabetes type II Mother   . Prostate cancer Father 28    SOCIAL HISTORY: Social History   Social History  . Marital status: Married    Spouse name: [name]  . Number of children: 1  . Years of education: 12+   Occupational History  .  [employer]   Social History Main Topics  . Smoking status: Never Smoker  . Smokeless tobacco: Never Used  . Alcohol use No  . Drug use: No  . Sexual activity: Not on file   Other Topics Concern  . Not on file   Social History Narrative   Patient is married [name]) and lives at home with his wife.   Patient has one adult child.   Patient is working full-time.   Patient has a high school and auto classes.   Patient is right-handed.   Patient drinks two cups of coffee every morning and very little tea.      PHYSICAL EXAM  Vitals:   01/24/16 0744  BP: 118/82  Pulse: 64  Resp: 20  Weight: (!) 328 lb (148.8 kg)  Height: 6' (1.829 m)   Body mass index is 44.48 kg/m.  Generalized: Well developed, in no acute distress , He reports feeling fatigued.  Neck: Circumference 18 inches, Mallampati 4+   There is a irregular pulse- the patient is in atrial fibrillation, no murmur. Nor bruit.   Neurological examination  Mentation: Alert oriented to time, place, history  taking. Follows all commands speech and language fluent Cranial nerve ; No  impairment of smell or taste sensation.  Pupils were equal round reactive to light. Extraocular movements were full, visual field were full on confrontational test. Facial sensation and strength were normal. Uvula and tongue midline. Head turning and shoulder shrug  were normal and symmetric. Motor:  5 over 5 strength of all 4 extremities. There is a higher tone over the right biceps but this is not cogwheel rigidity. At the wrist both upper extremities have similar muscle tone. The patient is status post  right rotator cuff injury and surgery. Sensory: intact to soft touch on all 4 extremities. No evidence of extinction is noted.  Coordination: good finger-nose-finger  bilaterally.  Gait and station: Gait is normal.  Reflexes:  Symmetric-  bilaterally.   DIAGNOSTIC DATA (LABS, IMAGING, TESTING) - I reviewed patient records, labs, notes, testing and imaging myself where available. CT HEAD WITHOUT CONTRAST  TECHNIQUE: Contiguous axial images were obtained from the base of the skull through the vertex without intravenous contrast.  COMPARISON:  12/20/2007 head CT  FINDINGS: Brain: No evidence of acute infarction, hemorrhage, hydrocephalus, extra-axial collection or mass lesion/mass effect.  Mild generalized cerebral volume loss and chronic small-vessel white matter ischemic changes again noted. Vascular: Intracranial atherosclerotic calcifications noted. Skull: Normal. Negative for fracture or focal lesion.  Sinuses/Orbits: No acute finding.  IMPRESSION: No evidence of acute intracranial abnormality.Mild atrophy and chronic small-vessel white matter ischemic changes.   Electronically Signed   By: Harmon Pier M.D.   On: 12/12/2015 09:15  Lab Results  Component Value Date   WBC 8.4 12/12/2015   HGB 16.7 12/12/2015   HCT 50.9 12/12/2015   MCV 89.0 12/12/2015   PLT 172 12/12/2015      Component Value Date/Time   NA 137 12/12/2015 0839   K 4.3 12/12/2015 0839   CL  106 12/12/2015 0839   CO2 23 12/12/2015 0839   GLUCOSE 159 (H) 12/12/2015 0839   BUN 19 12/12/2015 0839   CREATININE 1.32 (H) 12/12/2015 0839   CALCIUM 9.7 12/12/2015 0839   GFRNONAA 55 (L) 12/12/2015 0839   GFRAA >60 12/12/2015 0839      ASSESSMENT AND PLAN 65 y.o. year old male  here seen in a 25 minute RV with onset of atrial fibrillation , recent TIA  or small stroke with more than 50% of the visit time dedicated to face to face discussion of his the CPD/ OSA impact, his insomnia, and the atrial fibrillation impact on his cardio-and cerebrovascular health , his career .  1. Obstructive sleep apnea on CPAP, well controlled but with ongoing concern of insomnia.  2. Embolic stroke in a fibrillation suspected, but Ct was negative. Dr Ludwig Clarks ordered a Brain MRI . Dr Jacinto Halim will perform a cardioversion tomorrow . 3. Obesity. Unchanged.   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 He announced that he will be allowed to drive again if he stays symptom free for 30 days, policy of DOT and his employer. Should he have additional atrial fibrillation and that 30 day period,  He intends to retire.  RV in 3 month with Butch Penny, NP  Follow up on CPAP and on a fib.  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     Tamika Nou, MD   01/24/2016, 8:10 AM [name] [address], [address] [place], [place] [address] [phone]

## 2016-01-25 ENCOUNTER — Ambulatory Visit (HOSPITAL_COMMUNITY)
Admission: RE | Admit: 2016-01-25 | Discharge: 2016-01-25 | Disposition: A | Discharge status: Home / Self Care | Payer: 59 | Source: Ambulatory Visit | Attending: Cardiology | Admitting: Cardiology

## 2016-01-25 ENCOUNTER — Encounter (HOSPITAL_COMMUNITY): Payer: Self-pay | Admitting: *Deleted

## 2016-01-25 ENCOUNTER — Encounter (HOSPITAL_COMMUNITY)
Admission: RE | Disposition: A | Discharge status: Home / Self Care | Payer: Self-pay | Source: Ambulatory Visit | Attending: Cardiology

## 2016-01-25 ENCOUNTER — Ambulatory Visit (HOSPITAL_COMMUNITY): Payer: 59 | Admitting: Anesthesiology

## 2016-01-25 DIAGNOSIS — I119 Hypertensive heart disease without heart failure: Secondary | ICD-10-CM | POA: Insufficient documentation

## 2016-01-25 DIAGNOSIS — E119 Type 2 diabetes mellitus without complications: Secondary | ICD-10-CM | POA: Insufficient documentation

## 2016-01-25 DIAGNOSIS — I2510 Atherosclerotic heart disease of native coronary artery without angina pectoris: Secondary | ICD-10-CM | POA: Insufficient documentation

## 2016-01-25 DIAGNOSIS — Z79899 Other long term (current) drug therapy: Secondary | ICD-10-CM | POA: Insufficient documentation

## 2016-01-25 DIAGNOSIS — N189 Chronic kidney disease, unspecified: Secondary | ICD-10-CM | POA: Insufficient documentation

## 2016-01-25 DIAGNOSIS — E785 Hyperlipidemia, unspecified: Secondary | ICD-10-CM | POA: Diagnosis not present

## 2016-01-25 DIAGNOSIS — Z6841 Body Mass Index (BMI) 40.0 and over, adult: Secondary | ICD-10-CM | POA: Insufficient documentation

## 2016-01-25 DIAGNOSIS — J449 Chronic obstructive pulmonary disease, unspecified: Secondary | ICD-10-CM | POA: Diagnosis not present

## 2016-01-25 DIAGNOSIS — I4891 Unspecified atrial fibrillation: Secondary | ICD-10-CM

## 2016-01-25 DIAGNOSIS — M1990 Unspecified osteoarthritis, unspecified site: Secondary | ICD-10-CM | POA: Insufficient documentation

## 2016-01-25 DIAGNOSIS — G4733 Obstructive sleep apnea (adult) (pediatric): Secondary | ICD-10-CM | POA: Diagnosis not present

## 2016-01-25 HISTORY — PX: CARDIOVERSION: SHX1299

## 2016-01-25 HISTORY — DX: Sleep apnea, unspecified: G47.30

## 2016-01-25 LAB — GLUCOSE, CAPILLARY: GLUCOSE-CAPILLARY: 124 mg/dL — AB (ref 65–99)

## 2016-01-25 SURGERY — CARDIOVERSION
Anesthesia: Monitor Anesthesia Care

## 2016-01-25 MED ORDER — SODIUM CHLORIDE 0.9 % IV SOLN
INTRAVENOUS | Status: DC
Start: 2016-01-25 — End: 2016-01-25
  Administered 2016-01-25 (×2): via INTRAVENOUS

## 2016-01-25 MED ORDER — PROPOFOL 10 MG/ML IV BOLUS
INTRAVENOUS | Status: DC | PRN
  Administered 2016-01-25: 90 mg via INTRAVENOUS

## 2016-01-25 MED ORDER — LIDOCAINE HCL (CARDIAC) 20 MG/ML IV SOLN
INTRAVENOUS | Status: DC | PRN
  Administered 2016-01-25: 40 mg via INTRATRACHEAL

## 2016-01-25 NOTE — Discharge Instructions (Signed)
Electrical Cardioversion, Care After °Refer to this sheet in the next few weeks. These instructions provide you with information on caring for yourself after your procedure. Your health care provider may also give you more specific instructions. Your treatment has been planned according to current medical practices, but problems sometimes occur. Call your health care provider if you have any problems or questions after your procedure. °WHAT TO EXPECT AFTER THE PROCEDURE °After your procedure, it is typical to have the following sensations: °· Some redness on the skin where the shocks were delivered. If this is tender, a sunburn lotion or hydrocortisone cream may help. °· Possible return of an abnormal heart rhythm within hours or days after the procedure. °HOME CARE INSTRUCTIONS °· Take medicines only as directed by your health care provider. Be sure you understand how and when to take your medicine. °· Learn how to feel your pulse and check it often. °· Limit your activity for 48 hours after the procedure or as directed by your health care provider. °· Avoid or minimize caffeine and other stimulants as directed by your health care provider. °SEEK MEDICAL CARE IF: °· You feel like your heart is beating too fast or your pulse is not regular. °· You have any questions about your medicines. °· You have bleeding that will not stop. °SEEK IMMEDIATE MEDICAL CARE IF: °· You are dizzy or feel faint. °· It is hard to breathe or you feel short of breath. °· There is a change in discomfort in your chest. °· Your speech is slurred or you have trouble moving an arm or leg on one side of your body. °· You get a serious muscle cramp that does not go away. °· Your fingers or toes turn cold or blue. °  °This information is not intended to replace advice given to you by your health care provider. Make sure you discuss any questions you have with your health care provider. °  °Document Released: 01/01/2013 Document Revised: 04/03/2014  Document Reviewed: 01/01/2013 °Elsevier Interactive Patient Education ©2016 [employer]vier Inc[employer] ° °

## 2016-01-25 NOTE — Interval H&P Note (Signed)
History and Physical Interval Note:  01/25/2016 12:07 PM  William Cameron  has presented today for surgery, with the diagnosis of AFIB  The various methods of treatment have been discussed with the patient and family. After consideration of risks, benefits and other options for treatment, the patient has consented to  Procedure(s): CARDIOVERSION (N/A) as a surgical intervention .  The patient's history has been reviewed, patient examined, no change in status, stable for surgery.  I have reviewed the patient's chart and labs.  Questions were answered to the patient's satisfaction.     Yates DecampGANJI, Danniella Robben

## 2016-01-25 NOTE — Transfer of Care (Signed)
Immediate Anesthesia Transfer of Care Note  Patient: William Cameron  Procedure(s) Performed: Procedure(s): CARDIOVERSION (N/A)  Patient Location: Endoscopy Unit  Anesthesia Type:MAC  Level of Consciousness: awake  Airway & Oxygen Therapy: Patient Spontanous Breathing and Patient connected to nasal cannula oxygen  Post-op Assessment: Report given to RN, Post -op Vital signs reviewed and stable and Patient moving all extremities  Post vital signs: Reviewed and stable  Last Vitals:  Vitals:   01/25/16 1010  BP: 139/89  Pulse: (!) 58  Resp: 17  Temp: 36.8 C    Last Pain:  Vitals:   01/25/16 1010  TempSrc: Oral         Complications: No apparent anesthesia complications

## 2016-01-25 NOTE — Anesthesia Postprocedure Evaluation (Signed)
Anesthesia Post Note  Patient: William Cameron  Procedure(s) Performed: Procedure(s) (LRB): CARDIOVERSION (N/A)  Patient location during evaluation: PACU Anesthesia Type: MAC Level of consciousness: awake and alert Pain management: pain level controlled Vital Signs Assessment: post-procedure vital signs reviewed and stable Respiratory status: spontaneous breathing, nonlabored ventilation, respiratory function stable and patient connected to nasal cannula oxygen Cardiovascular status: stable and blood pressure returned to baseline Anesthetic complications: no    Last Vitals:  Vitals:   01/25/16 1216 01/25/16 1220  BP: (!) 153/82 (!) 144/84  Pulse: 70 62  Resp: (!) 28 13  Temp:      Last Pain:  Vitals:   01/25/16 1010  TempSrc: Oral                 Reino KentJudd, [name]

## 2016-01-25 NOTE — Anesthesia Preprocedure Evaluation (Addendum)
Anesthesia Evaluation  Patient identified by MRN, date of birth, ID band Patient awake    Reviewed: Allergy & Precautions, NPO status , Patient's Chart, lab work & pertinent test results, reviewed documented beta blocker date and time   History of Anesthesia Complications (+) PONV and history of anesthetic complications  Airway Mallampati: II  TM Distance: >3 FB Neck ROM: Full    Dental  (+) Teeth Intact, Dental Advisory Given   Pulmonary sleep apnea and Continuous Positive Airway Pressure Ventilation , COPD,    breath sounds clear to auscultation       Cardiovascular hypertension, Pt. 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    Peds  Hematology   Anesthesia Other Findings   Reproductive/Obstetrics                            Anesthesia Physical  Anesthesia Plan  ASA: III  Anesthesia Plan: MAC   Post-op Pain Management:    Induction: Intravenous  Airway Management Planned: Mask  Additional Equipment:   Intra-op Plan:   Post-operative Plan:   Informed Consent: I have reviewed the patients History and Physical, chart, labs and discussed the procedure including the risks, benefits and alternatives for the proposed anesthesia with the patient or authorized representative who has indicated his/her understanding and acceptance.     Plan Discussed with:   Anesthesia Plan Comments:        Anesthesia Quick Evaluation

## 2016-01-25 NOTE — CV Procedure (Signed)
Direct current cardioversion:  Indication symptomatic A. Fibrillation.  Procedure: Using 90 mg of IV Propofol and 40 IV Lidocaine (for reducing venous pain) for achieving deep sedation, synchronized direct current cardioversion performed. Patient was delivered with 120 Joules of electricity X 1 then 150 J x 1 with success to NSR. Patient tolerated the procedure well. No immediate complication noted.

## 2016-01-26 ENCOUNTER — Encounter (HOSPITAL_COMMUNITY): Payer: Self-pay | Admitting: Cardiology

## 2016-04-25 ENCOUNTER — Encounter: Payer: Self-pay | Admitting: Adult Health

## 2016-04-25 ENCOUNTER — Ambulatory Visit (INDEPENDENT_AMBULATORY_CARE_PROVIDER_SITE_OTHER): Payer: 59 | Admitting: Adult Health

## 2016-04-25 VITALS — BP 158/88 | HR 55 | Resp 20 | Ht 72.0 in | Wt 325.0 lb

## 2016-04-25 DIAGNOSIS — Z9989 Dependence on other enabling machines and devices: Secondary | ICD-10-CM

## 2016-04-25 DIAGNOSIS — G4733 Obstructive sleep apnea (adult) (pediatric): Secondary | ICD-10-CM

## 2016-04-25 NOTE — Progress Notes (Signed)
I agree with the assessment and plan as directed by NP .The patient is known to me .   Chritopher Coster, MD  

## 2016-04-25 NOTE — Progress Notes (Signed)
PATIENT: William Cameron DOB: [birth year]  REASON FOR VISIT: follow up- shirt to sleep apnea on CPAP HISTORY FROM: patient  HISTORY OF PRESENT ILLNESS: Today 04/25/2016: William Cameron is a 66 year old male with a history of obstructive sleep apnea on CPAP and stroke. He returns today for an evaluation. His CPAP download indicates that he uses machine nightly. Each night he used his machine greater than 4 hours. He is on a minimum pressure of 8 cmh20 and maximum pressure 14 cm water with EPR 3. His residual AHI is 0.2. The patient states that he has a hard time keeping the mask on all night. He states that he usually can only make it approximately 4 hours. The patient reports that he is claustrophobic so he struggles using the mask. 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 The patient reports that he had a successful cardioversion. He returns today for an evaluation.   HISTORY DOHMEIER  10/22/14 (CD): William Cameron is a 66 y.o. caucasian , married , right handed male , who is seen here as a referral from Dr. 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   [name] underwent a sleep study on 09-07-13 based on his neck circumference of 20 inches and is BMI of 48.1 his blood pressure prestudy was normal. However the sleep study revealed an AHI of 49.9 RDI of 63.2 and the patient slept not even 1 minute at night on his back these apnea in the please were obtained by the patient slept on his side and prone .  The lowest oxygen saturation was 80% was 55.9 minutes of desaturation time in total. Was critical hypoxemia. The patient also had some PACs and PVCs during his sleep study, while he was titrated to 11 cm water.  We  obtained a download through [facility] , his DME , auto-titrated between 8 and 14 cm water: the patient's 95th percentile is 13 cm water his AHI is 1.0. He had 100% compliance his average user time was 4 hours and 44 minutes at night.  We obtained a second in office download dated 10-21-13, 97% compliance 4 hours and 44 minutes 95th percentile of pressure was 12.8 cm pressure. He has now been using CPAP over 30 days with high compliance and feels improvement. Epworth 1 .  DOT driver with severe OSA, and hypoxemia- Patient responded excellent to CPAP at 13 cm water, by 955 pressure of auto titration. ONO was not longer suggestive of hyoxemia while on CPAP.  Interval history from 7-20 8-16. William Cameron is here for her regular revisit he still is not exactly in love with the CPAP but he certainly has used the oxygen concentrator overnight he never had any need for the portable oxygen tanks to be used. He has since his last visit lost over the 5 pounds no longer short of breath had hip surgery and is able to exercise walk and work outside. His physical activity level is much increased. Questions if he still needs oxygen at home and it's possible that he does not. He is using his CPAP and he has 100% compliance over the last 30 days and 100% of these days over 4 hours of continued  use the residual AHI is 0.5 which is excellent as an out set machine between 8 and 14 cm water with 3 cm EPR and the 91st percentile pressure used is 11.6. Repeat not to make any adjustments for the CPAP device he underwent a pulse oximetry in 2015 which documented hypoxemia. I would like to repeat repeat the finger clip at night on CPAP without oxygen and see if he still needs oxygen after this massive weight loss. The obesity hypoventilation diagnosis would be possibly removed from his chart given that his BMI has shrunken. During a recent DOT related exam he was told that his hematocrit is very high. I am concerned that this may mean that he  is low used to lower oxygen levels. However it is worth rechecking his nocturnal oxygenation    REVIEW OF SYSTEMS: Out of a complete 14 system review of symptoms, the patient complains only of the following symptoms, and all other reviewed systems are negative.  Epworth sleepiness score 1  ALLERGIES: Allergies  Allergen Reactions  . Penicillins Anaphylaxis    ephalosporin use.  . Tetanus Toxoids Swelling    Can't breathe  . Sulfa Antibiotics Swelling    Swelling and nausea    HOME MEDICATIONS: Outpatient Medications Prior to Visit  Medication Sig Dispense Refill  . apixaban (ELIQUIS) 5 MG TABS tablet Take 5 mg by mouth 2 (two) times daily.    . benazepril (LOTENSIN) 40 MG tablet Take 20 mg by mouth every evening.     . cholecalciferol (VITAMIN D) 1000 units tablet Take 2,000 Units by mouth daily.     Marland Kitchen FARXIGA 5 MG TABS Take 1 tablet by mouth daily.     . Fenofibric Acid 105 MG TABS Take 1 tablet by mouth daily. Reported on 06/02/2015  5  . ibuprofen (ADVIL,MOTRIN) 200 MG tablet Take 400 mg by mouth every 6 (six) hours as needed for mild pain.     . metoprolol succinate (TOPROL-XL) 50 MG 24 hr tablet Take 50 mg by mouth daily. Take with or immediately following a meal.     No facility-administered medications prior to visit.     PAST MEDICAL HISTORY: Past Medical History:  Diagnosis Date  . Bronchitis    chronic bronchitis  . COPD (chronic obstructive pulmonary disease) (HCC)   . Diabetes mellitus without complication (HCC)   . Hypertension   . PONV (postoperative nausea and vomiting)    post op nausea  . 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   . Sleep apnea    cpap at night    PAST SURGICAL HISTORY: Past Surgical History:  Procedure Laterality Date  . CARDIOVERSION N/A 01/25/2016   Procedure: CARDIOVERSION;  Surgeon: Yates Decamp, MD;  [facility]: [facility]  ENDOSCOPY;  Service: Cardiovascular;  Laterality: N/A;  . COLONOSCOPY  02/02/2012   Procedure: COLONOSCOPY;  Surgeon: Theda Belfast, MD;  Location: [facility] ENDOSCOPY;  Service: Endoscopy;  Laterality: N/A;  . HERNIA REPAIR    . HYDROCELE EXCISION / REPAIR    . JOINT REPLACEMENT     rt knee  . ROTATOR CUFF REPAIR  rt  . TOTAL HIP ARTHROPLASTY Right 07/14/2014   Procedure: RIGHT TOTAL HIP ARTHROPLASTY;  Surgeon: Durene Romans, MD;  Location: [facility] ORS;  Service: Orthopedics;  Laterality: Right;    FAMILY HISTORY: Family History  Problem Relation Age of Onset  . Diabetes type  II Mother   . Prostate cancer Father 75    SOCIAL HISTORY: Social History   Social History  . Marital status: Married    Spouse name: [name]  . Number of children: 1  . Years of education: 12+   Occupational History  .  [employer]   Social History Main Topics  . Smoking status: Never Smoker  . Smokeless tobacco: Never Used  . Alcohol use No  . Drug use: No  . Sexual activity: Not on file   Other Topics Concern  . Not on file   Social History Narrative   Patient is married [name]) and lives at home with his wife.   Patient has one adult child.   Patient is working full-time.   Patient has a high school and auto classes.   Patient is right-handed.   Patient drinks two cups of coffee every morning and very little tea.      PHYSICAL EXAM  Vitals:   04/25/16 0726  BP: (!) 158/88  Pulse: (!) 55  Resp: 20  Weight: (!) 325 lb (147.4 kg)  Height: 6' (1.829 m)   Body mass index is 44.08 kg/m.  Generalized: Well developed, in no acute distress  Neck: Circumference 15/2 inches  Neurological examination  Mentation: Alert oriented to time, place, history taking. Follows all commands speech and language fluent Cranial nerve II-XII: Pupils were equal round reactive to light. Extraocular movements were full, visual field were full on confrontational test. Facial sensation and strength were normal.  Uvula tongue midline. Head turning and shoulder shrug  were normal and symmetric. Motor: The motor testing reveals 5 over 5 strength of all 4 extremities. Good symmetric motor tone is noted throughout.  Sensory: Sensory testing is intact to soft touch on all 4 extremities. No evidence of extinction is noted.  Coordination: Cerebellar testing reveals good finger-nose-finger and heel-to-shin bilaterally.  Gait and station: Gait is normal. Tandem gait is normal. Romberg is negative. No drift is seen.  Reflexes: Deep tendon reflexes are symmetric and normal bilaterally.   DIAGNOSTIC DATA (LABS, IMAGING, TESTING) - I reviewed patient records, labs, notes, testing and imaging myself where available.  Lab Results  Component Value Date   WBC 8.4 12/12/2015   HGB 16.7 12/12/2015   HCT 50.9 12/12/2015   MCV 89.0 12/12/2015   PLT 172 12/12/2015      Component Value Date/Time   NA 137 12/12/2015 0839   K 4.3 12/12/2015 0839   CL 106 12/12/2015 0839   CO2 23 12/12/2015 0839   GLUCOSE 159 (H) 12/12/2015 0839   BUN 19 12/12/2015 0839   CREATININE 1.32 (H) 12/12/2015 0839   CALCIUM 9.7 12/12/2015 0839   GFRNONAA 55 (L) 12/12/2015 0839   GFRAA >60 12/12/2015 0839   No results found for: CHOL, HDL, LDLCALC, LDLDIRECT, TRIG, CHOLHDL Lab Results  Component Value Date   HGBA1C 6.0 10/05/2014      ASSESSMENT AND PLAN 66 y.o. year old male  has a past medical history of Bronchitis; COPD (chronic obstructive pulmonary disease) (HCC); Diabetes mellitus without complication (HCC); Hypertension; PONV (postoperative nausea and vomiting); Severe obesity (BMI >= 40) (HCC) (09/03/2013); and Sleep apnea. here with:  1. Obstructive sleep apnea on CPAP  I spoke to the patient about possibly doing a desensitization for CPAP however he  refuses. I also reviewed the patient's risk factors for stroke to sleep apnea. Also explained that untreated sleep apnea is an independent risk factor for stroke and heart  attack.  Patient voices understanding. He states that once he retires he will no longer uses the CPAP machine. He will follow-up in one year or sooner if needed.  I spent 15 minutes with the patient 50% of this time was spent reviewing risk factors for sleep apnea and risks associated with untreated sleep apnea.  Butch PennyMegan Radwan Cowley, MSN, NP-C 04/25/2016, 7:29 AM [facility] [address], [address] [place], [place] [address] [phone]

## 2016-04-25 NOTE — Patient Instructions (Signed)
Continue using CPAP nightly Consider desensitizing to tolerate the CPAP longer If your symptoms worsen or you develop new symptoms please let us know.

## 2016-09-26 ENCOUNTER — Ambulatory Visit (INDEPENDENT_AMBULATORY_CARE_PROVIDER_SITE_OTHER): Payer: Self-pay | Admitting: Physician Assistant

## 2016-09-26 ENCOUNTER — Encounter: Payer: Self-pay | Admitting: Physician Assistant

## 2016-09-26 ENCOUNTER — Ambulatory Visit: Payer: 59 | Admitting: Emergency Medicine

## 2016-09-26 VITALS — BP 128/78 | HR 74 | Temp 98.3°F | Resp 17 | Ht 71.5 in | Wt 336.0 lb

## 2016-09-26 DIAGNOSIS — Z0289 Encounter for other administrative examinations: Secondary | ICD-10-CM

## 2016-09-26 NOTE — Patient Instructions (Signed)
     IF you received an x-ray today, you will receive an invoice from [facility]. Please contact [facility] at [phone] with questions or concerns regarding your invoice.   IF you received labwork today, you will receive an invoice from [facility]. Please contact [facility] at [phone] with questions or concerns regarding your invoice.   Our billing staff will not be able to assist you with questions regarding bills from these companies.  You will be contacted with the lab results as soon as they are available. The fastest way to get your results is to activate your My Chart account. Instructions are located on the last page of this paperwork. If you have not heard from us regarding the results in 2 weeks, please contact this office.     

## 2016-09-26 NOTE — Progress Notes (Signed)
Commercial Driver Medical Examination   William Cameron is a 66 y.o. male with a pertinent medical history of well controlled diabetes last a1c at 6.5 in June 2018, OSA controlled on sleep apnea who presents today for a commercial driver fitness determination physical exam. The patient reports no problems today. In the past the patient reports receiving 1 year certificates. He denies focal neurological deficits, vision and hearing changes. He denies the habitual use of benzodiazepines, opioids, amphetamines and denies illicit drug use.   Current medications, family history, allergies, social history reviewed by me and exist elsewhere in the encounter.   Review of Systems  Constitutional: Negative for chills and fever.  HENT: Negative for sore throat.   Respiratory: Negative for cough, shortness of breath and wheezing.   Cardiovascular: Negative for chest pain and palpitations.  Gastrointestinal: Negative for nausea.  Genitourinary: Negative for dysuria.  Musculoskeletal: Negative for myalgias.  Skin: Negative for itching and rash.  Neurological: Negative for dizziness.  Psychiatric/Behavioral: Negative for depression.    Objective:     Vision/hearing:  Visual Acuity Screening   Right eye Left eye Both eyes  Without correction:     With correction: 20/20 20/20 20/20   Hearing Screening Comments: Peripheral Vision: Right eye 70 degrees. Left eye 70  degrees. The patient can distinguish the colors red, amber and green. The patient was able to hear a forced whisper from L=10 R=10  feet.  Applicant can recognize and distinguish among traffic control signals and devices showing standard red, green, and amber colors.  Corrective lenses required: Yes  Monocular Vision?: No  Hearing aid requirement: Yes  Physical Exam  Constitutional: He is oriented to person, place, and time. He appears well-developed. He is active and cooperative.  Non-toxic appearance.  Eyes: EOM are normal.  Pupils are equal, round, and reactive to light.  Cardiovascular: Normal rate, regular rhythm, S1 normal, S2 normal, normal heart sounds, intact distal pulses and normal pulses.  Exam reveals no gallop and no friction rub.   No murmur heard. Pulmonary/Chest: Effort normal. No stridor. No tachypnea. No respiratory distress. He has no wheezes. He has no rales.  Abdominal: Soft. Normal appearance and bowel sounds are normal. He exhibits no distension and no mass. There is no tenderness. There is no rigidity, no rebound, no guarding and no CVA tenderness. No hernia.  Musculoskeletal: He exhibits no edema.  Neurological: He is alert and oriented to person, place, and time. He has normal strength and normal reflexes. He is not disoriented. No cranial nerve deficit or sensory deficit. He exhibits normal muscle tone. Coordination and gait normal.  Skin: Skin is warm and dry. He is not diaphoretic. No pallor.  Psychiatric: His behavior is normal.  Vitals reviewed.   BP 128/78   Pulse 74   Temp 98.3 F (36.8 C) (Oral)   Resp 17   Ht 5' 11.5" (1.816 m)   Wt (!) 336 lb (152.4 kg)   SpO2 98%   BMI 46.21 kg/m   Labs:  Lab Results  Component Value Date   HGBA1C 6.0 10/05/2014   Comments: spgr:1.000 Pro:neg Blood:trace, glu:2000+   Assessment:    Healthy male exam.  Meets standards, but periodic monitoring required due to multiple risk factor for CAD, well controlled diabetes (most recent A1c documented per his labs at 6.7), sleep apnea compliant with therapy.  Driver qualified only for 1 year.    Plan:    Medical examiners certificate completed and printed. Return as needed.  Deliah Boston, MS, PA-C 2:57 PM, 09/26/2016

## 2016-09-27 ENCOUNTER — Encounter: Payer: Self-pay | Admitting: Physician Assistant

## 2017-04-24 ENCOUNTER — Encounter: Payer: Self-pay | Admitting: Neurology

## 2017-04-26 ENCOUNTER — Encounter: Payer: Self-pay | Admitting: Neurology

## 2017-04-26 ENCOUNTER — Ambulatory Visit: Payer: 59 | Admitting: Neurology

## 2017-04-26 VITALS — BP 149/89 | HR 52 | Ht 72.0 in | Wt 352.0 lb

## 2017-04-26 DIAGNOSIS — G4733 Obstructive sleep apnea (adult) (pediatric): Secondary | ICD-10-CM

## 2017-04-26 DIAGNOSIS — J449 Chronic obstructive pulmonary disease, unspecified: Secondary | ICD-10-CM | POA: Diagnosis not present

## 2017-04-26 DIAGNOSIS — I6330 Cerebral infarction due to thrombosis of unspecified cerebral artery: Secondary | ICD-10-CM

## 2017-04-26 DIAGNOSIS — Z9989 Dependence on other enabling machines and devices: Secondary | ICD-10-CM | POA: Diagnosis not present

## 2017-04-26 NOTE — Progress Notes (Signed)
PATIENT: William Cameron DOB: 14-Oct-1950  REASON FOR VISIT: follow up- shirt to sleep apnea on CPAP HISTORY FROM: patient  HISTORY OF PRESENT ILLNESS:  Interval history from 26 April 2017, I am seeing William Cameron today for a yearly revisit.  He is officially retired meanwhile but still does see occasional side job driving.  He has been a compliant CPAP user 100% for the last 30 days, with an average of 4 hours and 50 minutes at night his machine is resMeds air sense 10, was with a pressure window between 8 and 14 cmH2O and 3 cm EPR.  He has a remarkable reduction in apnea, his residual apnea index is 0.1. He reports sleeping well with the 95th percentile pressure of 12.9 cm and moderate but mostly mild air leaks.  He is using a nasal pillow interface. Air Fit P 10  Which slips off frequently- he would like to try a new ResMed. Halo nasal pillow. He is much less fatigued than before retirement and before CPAP, Epworth sleepiness score is endorsed at only one-point fatigue severity score much reduced 27 points.  He just developed nocturia while using CPAP for over 18 months.  This seems not to be apnea related but he wakes up more than 3 times at night and has an appointment made with a urologist.    MM Today 04/25/2016: William Cameron is a 67 year old male with a history of obstructive sleep apnea on CPAP and stroke. He returns today for an evaluation. His CPAP download indicates that he uses machine nightly. Each night he used his machine greater than 4 hours. He is on a minimum pressure of 8 cmh20 and maximum pressure 14 cm water with EPR 3. His residual AHI is 0.2. The patient states that he has a hard time keeping the mask on all night. He states that he usually can only make it approximately 4 hours. The patient reports that he is claustrophobic so he struggles using the mask. 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 The patient reports that he had a successful cardioversion. He returns today for an evaluation.   HISTORY William Cameron  10/22/14 (CD): William Cameron is a 67 y.o. caucasian , married , right handed male , who is seen here as a referral from Dr. 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   [name] underwent a sleep study on 09-07-13 based on his neck circumference of 20 inches and is BMI of 48.1 his blood pressure prestudy was normal. However the sleep study revealed an AHI of 49.9 RDI of 63.2 and the patient slept not even 1 minute at night on his back these apnea in the please were obtained by the patient slept on his side and prone .  The lowest oxygen saturation was 80% was 55.9 minutes of desaturation time in total. Was critical hypoxemia. The patient also had some PACs and PVCs during his sleep study, while he was titrated to 11 cm water.  We obtained a download through [facility] , his DME , auto-titrated between 8 and 14 cm water: the patient's 95th percentile is 13 cm water his AHI is 1.0. He had 100% compliance his average user  time was 4 hours and 44 minutes at night.  We obtained a second in office download dated 10-21-13, 97% compliance 4 hours and 44 minutes 95th percentile of pressure was 12.8 cm pressure. He has now been using CPAP over 30 days with high compliance and feels improvement. Epworth 1 .  DOT driver with severe OSA, and hypoxemia- Patient responded excellent to CPAP at 13 cm water, by 955 pressure of auto titration. ONO was not longer suggestive of hyoxemia while on CPAP.  Interval history from 7-20 8-16. William Cameron is here for her regular revisit he still is not exactly in love with the CPAP but he certainly has used the oxygen concentrator overnight he never had any need for the  portable oxygen tanks to be used. He has since his last visit lost over the 5 pounds no longer short of breath had hip surgery and is able to exercise walk and work outside. His physical activity level is much increased. Questions if he still needs oxygen at home and it's possible that he does not. He is using his CPAP and he has 100% compliance over the last 30 days and 100% of these days over 4 hours of continued use the residual AHI is 0.5 which is excellent as an out set machine between 8 and 14 cm water with 3 cm EPR and the 91st percentile pressure used is 11.6. Repeat not to make any adjustments for the CPAP device he underwent a pulse oximetry in 2015 which documented hypoxemia. I would like to repeat repeat the finger clip at night on CPAP without oxygen and see if he still needs oxygen after this massive weight loss. The obesity hypoventilation diagnosis would be possibly removed from his chart given that his BMI has shrunken. During a recent DOT related exam he was told that his hematocrit is very high. I am concerned that this may mean that he is low used to lower oxygen levels. However it is worth rechecking his nocturnal oxygenation    REVIEW OF SYSTEMS: Out of a complete 14 system review of symptoms, the patient complains only of the following symptoms, and all other reviewed systems are negative.  Epworth sleepiness score 1  ALLERGIES: Allergies  Allergen Reactions  . Penicillins Anaphylaxis    ephalosporin use.  . Tetanus Toxoids Swelling    Can't breathe  . Sulfa Antibiotics Swelling    Swelling and nausea    HOME MEDICATIONS: Outpatient Medications Prior to Visit  Medication Sig Dispense Refill  . amLODipine (NORVASC) 5 MG tablet Take 5 mg by mouth daily.    Marland Kitchen apixaban (ELIQUIS) 5 MG TABS tablet Take 5 mg by mouth 2 (two) times daily.    . benazepril (LOTENSIN) 40 MG tablet Take 20 mg by mouth every evening.     . cholecalciferol (VITAMIN D) 1000 units tablet Take 2,000  Units by mouth daily.     Marland Kitchen FARXIGA 5 MG TABS Take 1 tablet by mouth daily.     . Fenofibric Acid 105 MG TABS Take 1 tablet by mouth daily. Reported on 06/02/2015  5  . ibuprofen (ADVIL,MOTRIN) 200 MG tablet Take 400 mg by mouth every 6 (six) hours as needed for mild pain.     . metoprolol succinate (TOPROL-XL) 50 MG 24 hr tablet Take 50 mg by mouth daily. Take with or immediately following a meal.     No facility-administered medications prior to visit.     PAST MEDICAL HISTORY: Past Medical History:  Diagnosis Date  .  Bronchitis    chronic bronchitis  . COPD (chronic obstructive pulmonary disease) (HCC)   . Diabetes mellitus without complication (HCC)   . Hypertension   . PONV (postoperative nausea and vomiting)    post op nausea  . 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   . Sleep apnea    cpap at night    PAST SURGICAL HISTORY: Past Surgical History:  Procedure Laterality Date  . CARDIOVERSION N/A 01/25/2016   Procedure: CARDIOVERSION;  Surgeon: Yates Decamp, MD;  Location: [facility] ENDOSCOPY;  Service: Cardiovascular;  Laterality: N/A;  . COLONOSCOPY  02/02/2012   Procedure: COLONOSCOPY;  Surgeon: Theda Belfast, MD;  Location: [facility] ENDOSCOPY;  Service: Endoscopy;  Laterality: N/A;  . HERNIA REPAIR    . HYDROCELE EXCISION / REPAIR    . JOINT REPLACEMENT     rt knee  . ROTATOR CUFF REPAIR  rt  . TOTAL HIP ARTHROPLASTY Right 07/14/2014   Procedure: RIGHT TOTAL HIP ARTHROPLASTY;  Surgeon: Durene Romans, MD;  Location: [facility] ORS;  Service: Orthopedics;  Laterality: Right;    FAMILY HISTORY: Family History  Problem Relation Age of Onset  . Diabetes type II Mother   . Prostate cancer Father 75    SOCIAL HISTORY: Social History   Socioeconomic History  . Marital status: Married    Spouse name: [name]  . Number of children: 1  . Years of education: 12+  . Highest  education level: Not on file  Social Needs  . Financial resource strain: Not on file  . Food insecurity - worry: Not on file  . Food insecurity - inability: Not on file  . Transportation needs - medical: Not on file  . Transportation needs - non-medical: Not on file  Occupational History    Employer: [employer]  Tobacco Use  . Smoking status: Never Smoker  . Smokeless tobacco: Never Used  Substance and Sexual Activity  . Alcohol use: No  . Drug use: No  . Sexual activity: No  Other Topics Concern  . Not on file  Social History Narrative   Patient is married [name]) and lives at home with his wife.   Patient has one adult child.   Patient is working full-time.   Patient has a high school and auto classes.   Patient is right-handed.   Patient drinks two cups of coffee every morning and very little tea.      PHYSICAL EXAM  Vitals:   04/26/17 0817  BP: (!) 149/89  Pulse: (!) 52  Weight: (!) 352 lb (159.7 kg)  Height: 6' (1.829 m)   Body mass index is 47.74 kg/m.  Generalized: Well developed, in no acute distress  Neck: Circumference 15/2 inches  Neurological examination  Mentation: Alert oriented to time, place, history taking. Follows all commands speech and language fluent Cranial nerve II-XII: Pupils were equal round reactive to light. Extraocular movements were full, visual field were full on confrontational test. Facial sensation and strength were normal. Uvula tongue midline. Head turning and shoulder shrug  were normal and symmetric. Motor: The motor testing reveals 5 over 5 strength of all 4 extremities. Good symmetric motor tone is noted throughout.  Sensory: Sensory testing is intact to soft touch on all 4 extremities. No evidence of extinction is noted.  Coordination: Cerebellar testing reveals good finger-nose-finger and heel-to-shin bilaterally.  Gait and station: Gait is normal. Tandem  gait is normal. Romberg is negative. No drift is seen.  Reflexes:  Deep tendon reflexes are symmetric and normal bilaterally.   DIAGNOSTIC DATA (LABS, IMAGING, TESTING) - I reviewed patient records, labs, notes, testing and imaging myself where available.  Lab Results  Component Value Date   WBC 8.4 12/12/2015   HGB 16.7 12/12/2015   HCT 50.9 12/12/2015   MCV 89.0 12/12/2015   PLT 172 12/12/2015      Component Value Date/Time   NA 137 12/12/2015 0839   K 4.3 12/12/2015 0839   CL 106 12/12/2015 0839   CO2 23 12/12/2015 0839   GLUCOSE 159 (H) 12/12/2015 0839   BUN 19 12/12/2015 0839   CREATININE 1.32 (H) 12/12/2015 0839   CALCIUM 9.7 12/12/2015 0839   GFRNONAA 55 (L) 12/12/2015 0839   GFRAA >60 12/12/2015 0839   No results found for: CHOL, HDL, LDLCALC, LDLDIRECT, TRIG, CHOLHDL Lab Results  Component Value Date   HGBA1C 6.0 10/05/2014      ASSESSMENT AND PLAN 67 y.o. year old male  has a past medical history of Bronchitis, COPD (chronic obstructive pulmonary disease) (HCC), Diabetes mellitus without complication (HCC), Hypertension, PONV (postoperative nausea and vomiting), Severe obesity (BMI >= 40) (HCC) (09/03/2013), and Sleep apnea. here with:  1. Obstructive sleep apnea on auto airsense 10 CPAP, highly compliant at  100%. He is using a nasal pillow interface. Air Fit P 10  Which slips off frequently- he would like to try a new ResMed. Halo nasal pillow.   2. Main risk factor remains obesity- plus co-morbidities of  COPD, DM, HTN . I recommended medical weight management with Dr Dalbert GarnetBeasley and to involve wife and son, who determine the menu. Wife is overweight also.   I spent 20 minutes with the patient 50% of this time was spent reviewing risk factors for sleep apnea and risks associated with untreated sleep apnea. He understands, enjoys his deeper sleep and rested state.     Melvyn Novasarmen Avonelle Viveros, MD   04/26/2017, 8:43 AM [name] [address], [address] [place], [place] [address] [phone]

## 2017-05-03 LAB — HM DIABETES EYE EXAM

## 2017-07-30 ENCOUNTER — Ambulatory Visit: Payer: Medicare HMO | Admitting: Podiatry

## 2017-07-30 ENCOUNTER — Encounter: Payer: Self-pay | Admitting: Podiatry

## 2017-07-30 ENCOUNTER — Ambulatory Visit (INDEPENDENT_AMBULATORY_CARE_PROVIDER_SITE_OTHER): Payer: Medicare HMO

## 2017-07-30 DIAGNOSIS — L02619 Cutaneous abscess of unspecified foot: Secondary | ICD-10-CM | POA: Diagnosis not present

## 2017-07-30 DIAGNOSIS — M21612 Bunion of left foot: Secondary | ICD-10-CM | POA: Diagnosis not present

## 2017-07-30 DIAGNOSIS — M2042 Other hammer toe(s) (acquired), left foot: Secondary | ICD-10-CM

## 2017-07-30 DIAGNOSIS — L03119 Cellulitis of unspecified part of limb: Secondary | ICD-10-CM | POA: Diagnosis not present

## 2017-07-30 MED ORDER — GENTAMICIN SULFATE 0.1 % EX CREA: 1.0000 "application " | TOPICAL_CREAM | Freq: Three times a day (TID) | CUTANEOUS | 1 refills | Status: DC

## 2017-07-30 MED ORDER — DOXYCYCLINE HYCLATE 100 MG PO TABS: 100.0000 mg | ORAL_TABLET | Freq: Two times a day (BID) | ORAL | 0 refills | Status: DC

## 2017-07-30 NOTE — Patient Instructions (Signed)
Pre-Operative Instructions  Congratulations, you have decided to take an important step towards improving your quality of life.  You can be assured that the doctors and staff at [facility] will be with you every step of the way.  Here are some important things you should know:  1. Plan to be at the surgery center/hospital at least 1 (one) hour prior to your scheduled time, unless otherwise directed by the surgical center/hospital staff.  You must have a responsible adult accompany you, remain during the surgery and drive you home.  Make sure you have directions to the surgical center/hospital to ensure you arrive on time. 2. If you are having surgery at [facility] or [facility] hospitals, you will need a copy of your medical history and physical form from your family physician within one month prior to the date of surgery. We will give you a form for your primary physician to complete.  3. We make every effort to accommodate the date you request for surgery.  However, there are times where surgery dates or times have to be moved.  We will contact you as soon as possible if a change in schedule is required.   4. No aspirin/ibuprofen for one week before surgery.  If you are on aspirin, any non-steroidal anti-inflammatory medications (Mobic, Aleve, Ibuprofen) should not be taken seven (7) days prior to your surgery.  You make take Tylenol for pain prior to surgery.  5. Medications - If you are taking daily heart and blood pressure medications, seizure, reflux, allergy, asthma, anxiety, pain or diabetes medications, make sure you notify the surgery center/hospital before the day of surgery so they can tell you which medications you should take or avoid the day of surgery. 6. No food or drink after midnight the night before surgery unless directed otherwise by surgical center/hospital staff. 7. No alcoholic beverages 24-hours prior to surgery.  No smoking 24-hours prior or 24-hours after  surgery. 8. Wear loose pants or shorts. They should be loose enough to fit over bandages, boots, and casts. 9. Don't wear slip-on shoes. Sneakers are preferred. 10. Bring your boot with you to the surgery center/hospital.  Also bring crutches or a walker if your physician has prescribed it for you.  If you do not have this equipment, it will be provided for you after surgery. 11. If you have not been contacted by the surgery center/hospital by the day before your surgery, call to confirm the date and time of your surgery. 12. Leave-time from work may vary depending on the type of surgery you have.  Appropriate arrangements should be made prior to surgery with your employer. 13. Prescriptions will be provided immediately following surgery by your doctor.  Fill these as soon as possible after surgery and take the medication as directed. Pain medications will not be refilled on weekends and must be approved by the doctor. 14. Remove nail polish on the operative foot and avoid getting pedicures prior to surgery. 15. Wash the night before surgery.  The night before surgery wash the foot and leg well with water and the antibacterial soap provided. Be sure to pay special attention to beneath the toenails and in between the toes.  Wash for at least three (3) minutes. Rinse thoroughly with water and dry well with a towel.  Perform this wash unless told not to do so by your physician.  Enclosed: 1 Ice pack (please put in freezer the night before surgery)   1 Hibiclens skin cleaner     Pre-op instructions  If you have any questions regarding the instructions, please do not hesitate to call our office.  [place]: [address], [place], [place] [address] -- [phone]  [place]: [address] [place], [place] [address] -- [phone]  [place]: [address]  [place], [place] [address] -- [phone]  [place]: [address], [address], [place], [place] [address] -- [phone]  Website:  [email or web address] 

## 2017-07-30 NOTE — Progress Notes (Signed)
Subjective: 67 year old male with a history of diabetes mellitus and severe obesity presents to the office today regarding a symptomatic bunion and hammertoe deformity to the left foot.  Patient has had a hammertoe deformity to the second digit left foot for several years.  He has been seen by Dr. Elijah Birk here in [place] and was referred here for surgical consultation.  He currently has a superficial ulcer to the second toe of the left foot with some localized erythema.  Dr. Elijah Birk has been treating him with oral antibiotics.  He presents today for further treatment and evaluation   Objective: Physical Exam General: The patient is alert and oriented x3 in no acute distress.  Dermatology: There is a very superficial ulceration noted to the second digit left foot measuring approximately 0.5 x 0.5 x 0.2 cm with some localized purulent drainage upon debridement.  No malodor.  Wound base is mostly granular.  There is no exposed bone muscle tendon ligament or joint.  Periwound integrity is intact.  Vascular: Palpable pedal pulses bilaterally. No edema noted. Capillary refill within normal limits.  Neurological: Epicritic and protective threshold grossly intact bilaterally.   Musculoskeletal Exam: Clinical evidence of bunion deformity noted to the respective foot. There is a moderate pain on palpation range of motion of the first MPJ. Lateral deviation of the hallux noted consistent with hallux abductovalgus. Hammertoe contracture also noted on clinical exam to digits #2 of the left foot. Symptomatic pain on palpation and range of motion also noted to the metatarsal phalangeal joints of the respective hammertoe digits.    Radiographic Exam: Increased intermetatarsal angle greater than 15 with a hallux abductus angle greater than 30 noted on AP view. Moderate degenerative changes noted within the first MPJ. Contracture deformity also noted to the interphalangeal joints and MPJs of the digits of the  respective hammertoes.    Assessment: 1. HAV w/ bunion deformity left foot 2. Hammertoe deformity second digit left foot 3.  Ulcer second digit left foot secondary to diabetes mellitus   Plan of Care:  1. Patient was evaluated. X-Rays reviewed. 2.  Today we discussed that the patient will likely need to have bunion and hammertoe corrective surgery.  The patient would like to proceed with surgery.  I did explain to the patient that prior to surgery we will need to clear up any active infection regarding the superficial ulceration to the second toe of left foot. 3.  Today the deep wound cultures were taken and sent to pathology for culture and sensitivity 4.  Prescription for gentamicin cream.  Prescription also written for doxycycline 100 mg #20 5.  Prior to surgery the patient will also need to have medical clearance 6. Today we discussed the conservative versus surgical management of the presenting pathology. The patient opts for surgical management. All possible complications and details of the procedure were explained. All patient questions were answered. No guarantees were expressed or implied. 7. Authorization for surgery was initiated today. Surgery will consist of bunionectomy with metatarsal osteotomy left foot.  PIPJ arthroplasty with MPJ capsulotomy second digit left foot.  Possible Weil metatarsal osteotomy second left. 8.  Return to clinic 1 week postop    Felecia Shelling, DPM [facility]  Dr. Felecia Shelling, DPM    [address]  [place], [place] [address]                Office [phone]  Fax [phone]

## 2017-08-02 LAB — WOUND CULTURE
MICRO NUMBER: 90549603
SPECIMEN QUALITY: ADEQUATE

## 2017-08-03 ENCOUNTER — Encounter: Payer: Self-pay | Admitting: *Deleted

## 2017-08-08 ENCOUNTER — Encounter: Payer: Self-pay | Admitting: Neurology

## 2017-09-06 ENCOUNTER — Encounter: Payer: Self-pay | Admitting: Podiatry

## 2017-09-06 DIAGNOSIS — M2012 Hallux valgus (acquired), left foot: Secondary | ICD-10-CM | POA: Diagnosis not present

## 2017-09-06 DIAGNOSIS — M7752 Other enthesopathy of left foot: Secondary | ICD-10-CM | POA: Diagnosis not present

## 2017-09-06 DIAGNOSIS — M2042 Other hammer toe(s) (acquired), left foot: Secondary | ICD-10-CM | POA: Diagnosis not present

## 2017-09-07 ENCOUNTER — Telehealth: Payer: Self-pay | Admitting: Podiatry

## 2017-09-07 ENCOUNTER — Telehealth: Payer: Self-pay

## 2017-09-07 NOTE — Telephone Encounter (Signed)
Spoke with patient's wife, [name], she states that he is in pain right now. He has tried taking his pain medication, elevtaing and icing the area but it is not helping. I advised her to loosen ace wrap on surgical foot, being careful not to disturbed the gauze underneath. Also told patient's wife that he could take 2 percocet 5/325mg  at next scheduled dose to help alleviate pain. She is to call back if symptoms do not improve

## 2017-09-07 NOTE — Telephone Encounter (Signed)
Pt wife states that Mr. Charyl DancerGant is in pain and needs another pain medication.

## 2017-09-10 MED ORDER — TRAMADOL HCL 50 MG PO TABS: ORAL_TABLET | ORAL | 0 refills | Status: DC

## 2017-09-10 NOTE — Addendum Note (Signed)
Addended by: Alphia Kava'CONNELL, VALERY D on: 09/10/2017 01:24 PM   Modules accepted: Orders

## 2017-09-10 NOTE — Telephone Encounter (Signed)
[facility].

## 2017-09-10 NOTE — Telephone Encounter (Signed)
Left message informing pt's wife, [name], Dr. Logan BoresEvans had ordered Tramadol and it was [facility] to the [facility].

## 2017-09-12 ENCOUNTER — Ambulatory Visit (INDEPENDENT_AMBULATORY_CARE_PROVIDER_SITE_OTHER): Payer: Medicare HMO | Admitting: Podiatry

## 2017-09-12 ENCOUNTER — Ambulatory Visit (INDEPENDENT_AMBULATORY_CARE_PROVIDER_SITE_OTHER): Payer: Medicare HMO

## 2017-09-12 DIAGNOSIS — Z9889 Other specified postprocedural states: Secondary | ICD-10-CM

## 2017-09-12 DIAGNOSIS — M2042 Other hammer toe(s) (acquired), left foot: Secondary | ICD-10-CM

## 2017-09-12 DIAGNOSIS — M21612 Bunion of left foot: Secondary | ICD-10-CM

## 2017-09-12 MED ORDER — CIPROFLOXACIN HCL 500 MG PO TABS: 500.0000 mg | ORAL_TABLET | Freq: Two times a day (BID) | ORAL | 0 refills | Status: DC

## 2017-09-12 MED ORDER — DOXYCYCLINE HYCLATE 100 MG PO TABS: 100.0000 mg | ORAL_TABLET | Freq: Two times a day (BID) | ORAL | 0 refills | Status: DC

## 2017-09-17 ENCOUNTER — Ambulatory Visit (INDEPENDENT_AMBULATORY_CARE_PROVIDER_SITE_OTHER): Payer: Medicare HMO | Admitting: Podiatry

## 2017-09-17 ENCOUNTER — Other Ambulatory Visit: Payer: Self-pay | Admitting: Podiatry

## 2017-09-17 ENCOUNTER — Telehealth: Payer: Self-pay | Admitting: *Deleted

## 2017-09-17 DIAGNOSIS — M21612 Bunion of left foot: Secondary | ICD-10-CM

## 2017-09-17 DIAGNOSIS — L02619 Cutaneous abscess of unspecified foot: Secondary | ICD-10-CM

## 2017-09-17 DIAGNOSIS — L03119 Cellulitis of unspecified part of limb: Secondary | ICD-10-CM

## 2017-09-17 DIAGNOSIS — M2042 Other hammer toe(s) (acquired), left foot: Secondary | ICD-10-CM

## 2017-09-17 DIAGNOSIS — Z9889 Other specified postprocedural states: Secondary | ICD-10-CM

## 2017-09-17 NOTE — Telephone Encounter (Signed)
Faxed required form, demographics to [facility].

## 2017-09-17 NOTE — Telephone Encounter (Signed)
-----   Message from Felecia ShellingBrent M Evans, DPM sent at 09/17/2017  9:53 AM EDT ----- Regarding: Home health dressing changes Please order Home health dressing changes for patient every other day x 4 weeks. Patient is homebound.   Dx: status post left forefoot reconstructive surgery.   - Betadine wet to dry dressing changes.   Thanks, Dr. Logan BoresEvans

## 2017-09-19 ENCOUNTER — Telehealth: Payer: Self-pay | Admitting: *Deleted

## 2017-09-19 ENCOUNTER — Encounter: Payer: Medicare HMO | Admitting: Podiatry

## 2017-09-19 DIAGNOSIS — E119 Type 2 diabetes mellitus without complications: Secondary | ICD-10-CM | POA: Diagnosis not present

## 2017-09-19 DIAGNOSIS — T8149XA Infection following a procedure, other surgical site, initial encounter: Secondary | ICD-10-CM | POA: Diagnosis not present

## 2017-09-19 DIAGNOSIS — L03116 Cellulitis of left lower limb: Secondary | ICD-10-CM | POA: Diagnosis not present

## 2017-09-19 NOTE — Telephone Encounter (Signed)
William Cameron - [facility] states pt is receiving HHC with [facility].

## 2017-09-19 NOTE — Progress Notes (Signed)
   Subjective:  Patient presents today status post bunionectomy and hammertoe repair of the 2nd digit of the left foot. DOS: 09/06/17. He states the pain has improved since starting on stronger pain medication. He reports some continued redness and swelling. Patient is here for further evaluation and treatment.   Past Medical History:  Diagnosis Date  . Bronchitis    chronic bronchitis  . COPD (chronic obstructive pulmonary disease) (HCC)   . Diabetes mellitus without complication (HCC)   . Hypertension   . PONV (postoperative nausea and vomiting)    post op nausea  . 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   . Sleep apnea    cpap at night      Objective/Physical Exam Neurovascular status intact.  Skin incisions appear to be well coapted with sutures and staples intact. No dehiscence. No active bleeding noted. Erythema and edema noted throughout the left forefoot. Warmth noted.   Radiographic Exam:  Orthopedic hardware and osteotomies sites appear to be stable with routine healing.  Assessment: 1. s/p bunionectomy and hammertoe repair of the 2nd digit of the left foot. DOS: 09/06/17 2. Cellulitis left foot  Plan of Care:  1. Patient was evaluated. X-rays reviewed 2. Refill prescription for Doxycycline 100 mg #28 provided to patient.  3. Dressing changed. Keep clean, dry and intact for one week.  4. Continue weightbearing in CAM boot.  5. Return to clinic in one week.    Felecia ShellingBrent M. Evans, DPM [facility]  Dr. Felecia ShellingBrent M. Evans, DPM    [address]                                        [place], [place] [address]                Office [phone]  Fax [phone]

## 2017-09-20 LAB — WOUND CULTURE
MICRO NUMBER:: 90751958
SPECIMEN QUALITY: ADEQUATE

## 2017-09-20 NOTE — Progress Notes (Signed)
   Subjective:  Patient presents today status post bunionectomy and hammertoe repair of the 2nd digit of the left foot. DOS: 09/06/17. He reports continued redness and swelling of the area and states it has not improved much despite taking the Doxycycline and Cipro as directed. He denies modifying factors. Patient is here for further evaluation and treatment.   Past Medical History:  Diagnosis Date  . Bronchitis    chronic bronchitis  . COPD (chronic obstructive pulmonary disease) (HCC)   . Diabetes mellitus without complication (HCC)   . Hypertension   . PONV (postoperative nausea and vomiting)    post op nausea  . 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   . Sleep apnea    cpap at night      Objective/Physical Exam Neurovascular status intact.  Skin incisions appear to be well coapted with sutures and staples intact. No dehiscence. No active bleeding noted. Moderate drainage noted to the medial incision site. No malodor noted.   Assessment: 1. s/p bunionectomy and hammertoe repair of the 2nd digit of the left foot. DOS: 09/06/17 2. Cellulitis left foot  Plan of Care:  1. Patient was evaluated.  2. Culture taken from wound. Dry sterile dressing applied.  3. Continue weightbearing as tolerated.  4. Finish taking Doxycycline and Cipro as directed.  5. Orders for home health care dressing changes every other day. Betadine wet-to-dry dressings.  6. Return to clinic in one week.    Felecia ShellingBrent M. Sami Roes, DPM [facility]  Dr. Felecia ShellingBrent M. Shantai Tiedeman, DPM    [address]                                        [place], [place] [address]                Office [phone]  Fax [phone]) [phone]

## 2017-09-24 ENCOUNTER — Ambulatory Visit (INDEPENDENT_AMBULATORY_CARE_PROVIDER_SITE_OTHER): Payer: Medicare HMO | Admitting: Podiatry

## 2017-09-24 ENCOUNTER — Ambulatory Visit (INDEPENDENT_AMBULATORY_CARE_PROVIDER_SITE_OTHER): Payer: Medicare HMO

## 2017-09-24 DIAGNOSIS — M21612 Bunion of left foot: Secondary | ICD-10-CM

## 2017-09-24 DIAGNOSIS — Z9889 Other specified postprocedural states: Secondary | ICD-10-CM

## 2017-09-30 NOTE — Progress Notes (Signed)
   Subjective:  Patient presents today status post bunionectomy and hammertoe repair of the 2nd digit of the left foot. DOS: 09/06/17. He states he is doing much better now. He has only two days left of Doxycycline until he is finished. There are no modifying factors noted and he denies any new complaints at this time. Patient is here for further evaluation and treatment.   Past Medical History:  Diagnosis Date  . Bronchitis    chronic bronchitis  . COPD (chronic obstructive pulmonary disease) (HCC)   . Diabetes mellitus without complication (HCC)   . Hypertension   . PONV (postoperative nausea and vomiting)    post op nausea  . 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   . Sleep apnea    cpap at night      Objective/Physical Exam Neurovascular status intact.  Skin incisions appear to be well coapted with sutures and staples intact. No sign of infectious process noted. No dehiscence. No active bleeding noted. Moderate edema noted to the surgical extremity.  Radiographic Exam:  Orthopedic hardware and osteotomies sites appear to be stable with routine healing.  Assessment: 1. s/p bunionectomy and hammertoe repair of the 2nd digit of the left foot. DOS: 09/06/17 2. Cellulitis left foot - resolved   Plan of Care:  1. Patient was evaluated. X-Rays reviewed.  2. Staples removed. Dry sterile dressing applied.  3. Continue weightbearing in CAM boot.  4. Finish oral antibiotics, Cipro and Doxycycline.  5. Return to clinic in one week for pin removal.    Felecia ShellingBrent M. Anael Rosch, DPM [facility]  Dr. Felecia ShellingBrent M. Charles Andringa, DPM    [address]                                        [place], [place] [address]                Office [phone]  Fax [phone]

## 2017-10-01 ENCOUNTER — Telehealth: Payer: Self-pay | Admitting: *Deleted

## 2017-10-01 ENCOUNTER — Other Ambulatory Visit: Payer: Self-pay | Admitting: Podiatry

## 2017-10-01 ENCOUNTER — Ambulatory Visit (INDEPENDENT_AMBULATORY_CARE_PROVIDER_SITE_OTHER): Payer: Medicare HMO

## 2017-10-01 ENCOUNTER — Ambulatory Visit (INDEPENDENT_AMBULATORY_CARE_PROVIDER_SITE_OTHER): Payer: Medicare HMO | Admitting: Podiatry

## 2017-10-01 DIAGNOSIS — L03116 Cellulitis of left lower limb: Secondary | ICD-10-CM

## 2017-10-01 DIAGNOSIS — M2042 Other hammer toe(s) (acquired), left foot: Secondary | ICD-10-CM

## 2017-10-01 DIAGNOSIS — Z9889 Other specified postprocedural states: Secondary | ICD-10-CM

## 2017-10-01 DIAGNOSIS — M21612 Bunion of left foot: Secondary | ICD-10-CM

## 2017-10-01 MED ORDER — CIPROFLOXACIN HCL 500 MG PO TABS: 500.0000 mg | ORAL_TABLET | Freq: Two times a day (BID) | ORAL | 0 refills | Status: DC

## 2017-10-01 MED ORDER — LEVOFLOXACIN 750 MG PO TABS: 750.0000 mg | ORAL_TABLET | Freq: Every day | ORAL | 0 refills | Status: DC

## 2017-10-01 NOTE — Telephone Encounter (Signed)
-----   Message from Felecia ShellingBrent M Evans, DPM sent at 10/01/2017  9:26 AM EDT ----- Regarding: Referral to Infectious Disease Please place a consult for infectious disease.   Diagnosis: Cellulitis left foot postoperatively  Thanks, Dr. Logan BoresEvans

## 2017-10-01 NOTE — Progress Notes (Signed)
   Subjective:  Patient presents today status post bunionectomy and hammertoe repair of the 2nd digit of the left foot. DOS: 09/06/17.  Patient recently completed his oral antibiotics consisting of doxycycline and ciprofloxacin.  Patient has noticed increased redness with swelling since he quit taking the antibiotics.  He has been wearing the immobilization cam boot and presents today for further treatment and evaluation  Past Medical History:  Diagnosis Date  . Bronchitis    chronic bronchitis  . COPD (chronic obstructive pulmonary disease) (HCC)   . Diabetes mellitus without complication (HCC)   . Hypertension   . PONV (postoperative nausea and vomiting)    post op nausea  . 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   . Sleep apnea    cpap at night      Objective/Physical Exam Neurovascular status intact.  Skin incisions are well coapted with the exception of dehiscence with drainage noted to the proximal portion of the bunion incision site measuring approximately 1 cm in length along the incision.  Increased redness with swelling noted to the surgical forefoot.  No malodor noted.  Erythema does not extend proximal to the tarsometatarsal joint.  Percutaneous fixation pins are intact.  Negative for any significant pain on palpation.  Assessment: 1. s/p bunionectomy and hammertoe repair of the 2nd digit of the left foot. DOS: 09/06/17 2. Cellulitis left foot -reoccurrence  Plan of Care:  1. Patient was evaluated.  2.  Percutaneous fixation pins were removed today. 3.  Based on the most recent cultures taken on 09/17/2017, prescription provided for Levaquin 750 mg daily x2 weeks 4.  Continue iodine wet-to-dry dressings daily 5.  Continue weightbearing in the immobilization cam boot 6.  Return to clinic in 1 week.  In 1 week we will transition patient to a surgical  shoe.  Felecia ShellingBrent M. Gyan Cambre, DPM [facility]  Dr. Felecia ShellingBrent M. Renette Hsu, DPM    [address]                                        [place], [place] [address]                Office [phone]  Fax [phone](336) [phone]

## 2017-10-01 NOTE — Telephone Encounter (Signed)
Required form, clinicals and demographics ffaxed to Infectious Disease.

## 2017-10-08 ENCOUNTER — Other Ambulatory Visit: Payer: Self-pay

## 2017-10-08 ENCOUNTER — Ambulatory Visit (INDEPENDENT_AMBULATORY_CARE_PROVIDER_SITE_OTHER): Payer: Medicare HMO | Admitting: Podiatry

## 2017-10-08 ENCOUNTER — Inpatient Hospital Stay (HOSPITAL_COMMUNITY): Payer: Medicare HMO

## 2017-10-08 ENCOUNTER — Encounter (HOSPITAL_COMMUNITY): Payer: Self-pay | Admitting: Emergency Medicine

## 2017-10-08 ENCOUNTER — Emergency Department (HOSPITAL_COMMUNITY): Payer: Medicare HMO

## 2017-10-08 ENCOUNTER — Inpatient Hospital Stay (HOSPITAL_COMMUNITY)
Admission: EM | Admit: 2017-10-08 | Discharge: 2017-10-10 | DRG: 560 | Disposition: A | Discharge status: Home / Self Care | Payer: Medicare HMO | Source: Ambulatory Visit | Attending: Family Medicine | Admitting: Family Medicine

## 2017-10-08 ENCOUNTER — Other Ambulatory Visit: Payer: Self-pay | Admitting: Podiatry

## 2017-10-08 DIAGNOSIS — L03116 Cellulitis of left lower limb: Secondary | ICD-10-CM | POA: Diagnosis present

## 2017-10-08 DIAGNOSIS — Z79899 Other long term (current) drug therapy: Secondary | ICD-10-CM | POA: Diagnosis not present

## 2017-10-08 DIAGNOSIS — Y828 Other medical devices associated with adverse incidents: Secondary | ICD-10-CM | POA: Diagnosis present

## 2017-10-08 DIAGNOSIS — I10 Essential (primary) hypertension: Secondary | ICD-10-CM | POA: Diagnosis present

## 2017-10-08 DIAGNOSIS — I4891 Unspecified atrial fibrillation: Secondary | ICD-10-CM | POA: Diagnosis present

## 2017-10-08 DIAGNOSIS — Z1629 Resistance to other single specified antibiotic: Secondary | ICD-10-CM | POA: Diagnosis not present

## 2017-10-08 DIAGNOSIS — Z5181 Encounter for therapeutic drug level monitoring: Secondary | ICD-10-CM | POA: Diagnosis not present

## 2017-10-08 DIAGNOSIS — R402413 Glasgow coma scale score 13-15, at hospital admission: Secondary | ICD-10-CM | POA: Diagnosis present

## 2017-10-08 DIAGNOSIS — M86172 Other acute osteomyelitis, left ankle and foot: Secondary | ICD-10-CM | POA: Diagnosis not present

## 2017-10-08 DIAGNOSIS — L02619 Cutaneous abscess of unspecified foot: Secondary | ICD-10-CM

## 2017-10-08 DIAGNOSIS — Y838 Other surgical procedures as the cause of abnormal reaction of the patient, or of later complication, without mention of misadventure at the time of the procedure: Secondary | ICD-10-CM | POA: Diagnosis present

## 2017-10-08 DIAGNOSIS — Z9889 Other specified postprocedural states: Secondary | ICD-10-CM | POA: Diagnosis not present

## 2017-10-08 DIAGNOSIS — R7989 Other specified abnormal findings of blood chemistry: Secondary | ICD-10-CM | POA: Diagnosis not present

## 2017-10-08 DIAGNOSIS — E669 Obesity, unspecified: Secondary | ICD-10-CM | POA: Diagnosis not present

## 2017-10-08 DIAGNOSIS — R21 Rash and other nonspecific skin eruption: Secondary | ICD-10-CM | POA: Diagnosis not present

## 2017-10-08 DIAGNOSIS — Z887 Allergy status to serum and vaccine status: Secondary | ICD-10-CM

## 2017-10-08 DIAGNOSIS — Z794 Long term (current) use of insulin: Secondary | ICD-10-CM | POA: Diagnosis not present

## 2017-10-08 DIAGNOSIS — Z6841 Body Mass Index (BMI) 40.0 and over, adult: Secondary | ICD-10-CM

## 2017-10-08 DIAGNOSIS — Z881 Allergy status to other antibiotic agents status: Secondary | ICD-10-CM | POA: Diagnosis not present

## 2017-10-08 DIAGNOSIS — E1165 Type 2 diabetes mellitus with hyperglycemia: Secondary | ICD-10-CM | POA: Diagnosis present

## 2017-10-08 DIAGNOSIS — Z880 Allergy status to penicillin: Secondary | ICD-10-CM

## 2017-10-08 DIAGNOSIS — Z9989 Dependence on other enabling machines and devices: Secondary | ICD-10-CM

## 2017-10-08 DIAGNOSIS — R197 Diarrhea, unspecified: Secondary | ICD-10-CM | POA: Diagnosis not present

## 2017-10-08 DIAGNOSIS — M2042 Other hammer toe(s) (acquired), left foot: Secondary | ICD-10-CM | POA: Diagnosis present

## 2017-10-08 DIAGNOSIS — B9561 Methicillin susceptible Staphylococcus aureus infection as the cause of diseases classified elsewhere: Secondary | ICD-10-CM | POA: Diagnosis not present

## 2017-10-08 DIAGNOSIS — Z96641 Presence of right artificial hip joint: Secondary | ICD-10-CM | POA: Diagnosis present

## 2017-10-08 DIAGNOSIS — Z7901 Long term (current) use of anticoagulants: Secondary | ICD-10-CM

## 2017-10-08 DIAGNOSIS — T84038A Mechanical loosening of other internal prosthetic joint, initial encounter: Secondary | ICD-10-CM | POA: Diagnosis present

## 2017-10-08 DIAGNOSIS — G4730 Sleep apnea, unspecified: Secondary | ICD-10-CM

## 2017-10-08 DIAGNOSIS — T8459XA Infection and inflammatory reaction due to other internal joint prosthesis, initial encounter: Secondary | ICD-10-CM | POA: Diagnosis present

## 2017-10-08 DIAGNOSIS — G4733 Obstructive sleep apnea (adult) (pediatric): Secondary | ICD-10-CM | POA: Diagnosis present

## 2017-10-08 DIAGNOSIS — IMO0002 Reserved for concepts with insufficient information to code with codable children: Secondary | ICD-10-CM

## 2017-10-08 DIAGNOSIS — E1149 Type 2 diabetes mellitus with other diabetic neurological complication: Secondary | ICD-10-CM | POA: Diagnosis present

## 2017-10-08 DIAGNOSIS — E11628 Type 2 diabetes mellitus with other skin complications: Secondary | ICD-10-CM

## 2017-10-08 DIAGNOSIS — Z87892 Personal history of anaphylaxis: Secondary | ICD-10-CM | POA: Diagnosis not present

## 2017-10-08 DIAGNOSIS — L089 Local infection of the skin and subcutaneous tissue, unspecified: Secondary | ICD-10-CM

## 2017-10-08 DIAGNOSIS — L0390 Cellulitis, unspecified: Secondary | ICD-10-CM | POA: Diagnosis not present

## 2017-10-08 DIAGNOSIS — Z882 Allergy status to sulfonamides status: Secondary | ICD-10-CM | POA: Diagnosis not present

## 2017-10-08 DIAGNOSIS — Z8673 Personal history of transient ischemic attack (TIA), and cerebral infarction without residual deficits: Secondary | ICD-10-CM | POA: Diagnosis not present

## 2017-10-08 DIAGNOSIS — M21612 Bunion of left foot: Secondary | ICD-10-CM

## 2017-10-08 DIAGNOSIS — J449 Chronic obstructive pulmonary disease, unspecified: Secondary | ICD-10-CM | POA: Diagnosis present

## 2017-10-08 DIAGNOSIS — L03119 Cellulitis of unspecified part of limb: Secondary | ICD-10-CM

## 2017-10-08 DIAGNOSIS — E0843 Diabetes mellitus due to underlying condition with diabetic autonomic (poly)neuropathy: Secondary | ICD-10-CM

## 2017-10-08 DIAGNOSIS — Z96651 Presence of right artificial knee joint: Secondary | ICD-10-CM | POA: Diagnosis present

## 2017-10-08 DIAGNOSIS — M869 Osteomyelitis, unspecified: Secondary | ICD-10-CM | POA: Diagnosis present

## 2017-10-08 DIAGNOSIS — I639 Cerebral infarction, unspecified: Secondary | ICD-10-CM | POA: Diagnosis present

## 2017-10-08 DIAGNOSIS — E872 Acidosis: Secondary | ICD-10-CM | POA: Diagnosis present

## 2017-10-08 HISTORY — DX: Other specified abnormal findings of blood chemistry: R79.89

## 2017-10-08 LAB — CBC WITH DIFFERENTIAL/PLATELET
Abs Immature Granulocytes: 0.1 10*3/uL (ref 0.0–0.1)
BASOS ABS: 0.1 10*3/uL (ref 0.0–0.1)
Basophils Relative: 1 %
EOS ABS: 0.2 10*3/uL (ref 0.0–0.7)
Eosinophils Relative: 3 %
HEMATOCRIT: 52.5 % — AB (ref 39.0–52.0)
Hemoglobin: 16.6 g/dL (ref 13.0–17.0)
IMMATURE GRANULOCYTES: 2 %
LYMPHS ABS: 1.4 10*3/uL (ref 0.7–4.0)
LYMPHS PCT: 18 %
MCH: 29 pg (ref 26.0–34.0)
MCHC: 31.6 g/dL (ref 30.0–36.0)
MCV: 91.8 fL (ref 78.0–100.0)
Monocytes Absolute: 0.6 10*3/uL (ref 0.1–1.0)
Monocytes Relative: 8 %
NEUTROS PCT: 68 %
Neutro Abs: 5.4 10*3/uL (ref 1.7–7.7)
Platelets: 212 10*3/uL (ref 150–400)
RBC: 5.72 MIL/uL (ref 4.22–5.81)
RDW: 12.6 % (ref 11.5–15.5)
WBC: 7.8 10*3/uL (ref 4.0–10.5)

## 2017-10-08 LAB — COMPREHENSIVE METABOLIC PANEL
ALT: 16 U/L (ref 0–44)
AST: 19 U/L (ref 15–41)
Albumin: 3.9 g/dL (ref 3.5–5.0)
Alkaline Phosphatase: 54 U/L (ref 38–126)
Anion gap: 10 (ref 5–15)
BUN: 20 mg/dL (ref 8–23)
CO2: 25 mmol/L (ref 22–32)
Calcium: 10 mg/dL (ref 8.9–10.3)
Chloride: 101 mmol/L (ref 98–111)
Creatinine, Ser: 1.55 mg/dL — ABNORMAL HIGH (ref 0.61–1.24)
GFR, EST AFRICAN AMERICAN: 52 mL/min — AB (ref >60)
GFR, EST NON AFRICAN AMERICAN: 45 mL/min — AB (ref >60)
Glucose, Bld: 170 mg/dL — ABNORMAL HIGH (ref 70–99)
POTASSIUM: 4.9 mmol/L (ref 3.5–5.1)
Sodium: 136 mmol/L (ref 135–145)
TOTAL PROTEIN: 8.1 g/dL (ref 6.5–8.1)
Total Bilirubin: 0.6 mg/dL (ref 0.3–1.2)

## 2017-10-08 LAB — URINALYSIS, ROUTINE W REFLEX MICROSCOPIC
BACTERIA UA: NONE SEEN
BILIRUBIN URINE: NEGATIVE
Glucose, UA: >=500 mg/dL — AB
Hgb urine dipstick: NEGATIVE
KETONES UR: NEGATIVE mg/dL
LEUKOCYTES UA: NEGATIVE
NITRITE: NEGATIVE
Protein, ur: NEGATIVE mg/dL
Specific Gravity, Urine: 1.027 (ref 1.005–1.030)
pH: 5 (ref 5.0–8.0)

## 2017-10-08 LAB — GLUCOSE, CAPILLARY
Glucose-Capillary: 130 mg/dL — ABNORMAL HIGH (ref 70–99)
Glucose-Capillary: 166 mg/dL — ABNORMAL HIGH (ref 70–99)

## 2017-10-08 LAB — I-STAT CG4 LACTIC ACID, ED
LACTIC ACID, VENOUS: 2.23 mmol/L — AB (ref 0.5–1.9)
LACTIC ACID, VENOUS: 2.25 mmol/L — AB (ref 0.5–1.9)

## 2017-10-08 LAB — LACTIC ACID, PLASMA: Lactic Acid, Venous: 1.7 mmol/L (ref 0.5–1.9)

## 2017-10-08 LAB — HEMOGLOBIN A1C
HEMOGLOBIN A1C: 7.6 % — AB (ref 4.8–5.6)
MEAN PLASMA GLUCOSE: 171.42 mg/dL

## 2017-10-08 MED ORDER — BISACODYL 5 MG PO TBEC: 5.0000 mg | DELAYED_RELEASE_TABLET | Freq: Every day | ORAL | Status: DC | PRN

## 2017-10-08 MED ORDER — SODIUM CHLORIDE 0.9 % IV SOLN
2.0000 g | Freq: Three times a day (TID) | INTRAVENOUS | Status: DC
  Administered 2017-10-09 (×2): 2 g via INTRAVENOUS
  Filled 2017-10-08 (×4): qty 2

## 2017-10-08 MED ORDER — CIPROFLOXACIN IN D5W 400 MG/200ML IV SOLN
400.0000 mg | Freq: Once | INTRAVENOUS | Status: AC
  Administered 2017-10-08: 400 mg via INTRAVENOUS
  Filled 2017-10-08: qty 200

## 2017-10-08 MED ORDER — FENOFIBRATE 54 MG PO TABS
54.0000 mg | ORAL_TABLET | Freq: Every day | ORAL | Status: DC
  Administered 2017-10-09 – 2017-10-10 (×2): 54 mg via ORAL
  Filled 2017-10-08 (×2): qty 1

## 2017-10-08 MED ORDER — CLINDAMYCIN PHOSPHATE 600 MG/50ML IV SOLN: 600.0000 mg | Freq: Once | INTRAVENOUS | Status: DC

## 2017-10-08 MED ORDER — ONDANSETRON HCL 4 MG/2ML IJ SOLN: 4.0000 mg | Freq: Four times a day (QID) | INTRAMUSCULAR | Status: DC | PRN

## 2017-10-08 MED ORDER — VITAMIN D 1000 UNITS PO TABS
2000.0000 [IU] | ORAL_TABLET | Freq: Every day | ORAL | Status: DC
  Administered 2017-10-09 – 2017-10-10 (×2): 2000 [IU] via ORAL
  Filled 2017-10-08 (×2): qty 2

## 2017-10-08 MED ORDER — AMLODIPINE BESYLATE 5 MG PO TABS
5.0000 mg | ORAL_TABLET | Freq: Every day | ORAL | Status: DC
  Administered 2017-10-09 – 2017-10-10 (×2): 5 mg via ORAL
  Filled 2017-10-08 (×2): qty 1

## 2017-10-08 MED ORDER — METRONIDAZOLE IN NACL 5-0.79 MG/ML-% IV SOLN
500.0000 mg | Freq: Three times a day (TID) | INTRAVENOUS | Status: DC
  Administered 2017-10-08 – 2017-10-09 (×3): 500 mg via INTRAVENOUS
  Filled 2017-10-08 (×3): qty 100

## 2017-10-08 MED ORDER — HYDROCODONE-ACETAMINOPHEN 5-325 MG PO TABS
1.0000 | ORAL_TABLET | ORAL | Status: DC | PRN
  Administered 2017-10-08 – 2017-10-09 (×3): 2 via ORAL
  Filled 2017-10-08 (×3): qty 2

## 2017-10-08 MED ORDER — SODIUM CHLORIDE 0.9 % IV SOLN
2500.0000 mg | Freq: Once | INTRAVENOUS | Status: AC
  Administered 2017-10-08: 2500 mg via INTRAVENOUS
  Filled 2017-10-08: qty 2500

## 2017-10-08 MED ORDER — INSULIN ASPART 100 UNIT/ML ~~LOC~~ SOLN
0.0000 [IU] | Freq: Three times a day (TID) | SUBCUTANEOUS | Status: DC
  Administered 2017-10-09 (×2): 2 [IU] via SUBCUTANEOUS
  Administered 2017-10-10: 3 [IU] via SUBCUTANEOUS
  Administered 2017-10-10: 2 [IU] via SUBCUTANEOUS
  Administered 2017-10-10: 1 [IU] via SUBCUTANEOUS

## 2017-10-08 MED ORDER — SODIUM CHLORIDE 0.9 % IV SOLN
INTRAVENOUS | Status: AC
  Administered 2017-10-08 – 2017-10-09 (×2): via INTRAVENOUS

## 2017-10-08 MED ORDER — BENAZEPRIL HCL 20 MG PO TABS
20.0000 mg | ORAL_TABLET | Freq: Every evening | ORAL | Status: DC
  Administered 2017-10-09 – 2017-10-10 (×2): 20 mg via ORAL
  Filled 2017-10-08 (×2): qty 1

## 2017-10-08 MED ORDER — ACETAMINOPHEN 650 MG RE SUPP: 650.0000 mg | Freq: Four times a day (QID) | RECTAL | Status: DC | PRN

## 2017-10-08 MED ORDER — ACETAMINOPHEN 325 MG PO TABS: 650.0000 mg | ORAL_TABLET | Freq: Four times a day (QID) | ORAL | Status: DC | PRN

## 2017-10-08 MED ORDER — GADOBENATE DIMEGLUMINE 529 MG/ML IV SOLN
20.0000 mL | Freq: Once | INTRAVENOUS | Status: AC
  Administered 2017-10-08: 20 mL via INTRAVENOUS

## 2017-10-08 MED ORDER — ENOXAPARIN SODIUM 40 MG/0.4ML ~~LOC~~ SOLN: 40.0000 mg | SUBCUTANEOUS | Status: DC

## 2017-10-08 MED ORDER — TRAZODONE HCL 50 MG PO TABS: 25.0000 mg | ORAL_TABLET | Freq: Every evening | ORAL | Status: DC | PRN

## 2017-10-08 MED ORDER — FENOFIBRIC ACID 105 MG PO TABS: 1.0000 | ORAL_TABLET | Freq: Every day | ORAL | Status: DC

## 2017-10-08 MED ORDER — IBUPROFEN 400 MG PO TABS: 400.0000 mg | ORAL_TABLET | Freq: Four times a day (QID) | ORAL | Status: DC | PRN

## 2017-10-08 MED ORDER — METOPROLOL SUCCINATE ER 50 MG PO TB24
50.0000 mg | ORAL_TABLET | Freq: Every day | ORAL | Status: DC
  Administered 2017-10-09 – 2017-10-10 (×2): 50 mg via ORAL
  Filled 2017-10-08 (×2): qty 1

## 2017-10-08 MED ORDER — TRAMADOL HCL 50 MG PO TABS: 50.0000 mg | ORAL_TABLET | Freq: Four times a day (QID) | ORAL | Status: DC | PRN

## 2017-10-08 MED ORDER — ONDANSETRON HCL 4 MG PO TABS: 4.0000 mg | ORAL_TABLET | Freq: Four times a day (QID) | ORAL | Status: DC | PRN

## 2017-10-08 MED ORDER — VANCOMYCIN HCL IN DEXTROSE 1-5 GM/200ML-% IV SOLN: 1000.0000 mg | Freq: Once | INTRAVENOUS | Status: DC

## 2017-10-08 MED ORDER — AZTREONAM 2 G IJ SOLR
2.0000 g | Freq: Once | INTRAMUSCULAR | Status: AC
  Administered 2017-10-08: 2 g via INTRAVENOUS
  Filled 2017-10-08: qty 2

## 2017-10-08 MED ORDER — VANCOMYCIN HCL 10 G IV SOLR
1250.0000 mg | Freq: Two times a day (BID) | INTRAVENOUS | Status: DC
  Administered 2017-10-09 – 2017-10-10 (×4): 1250 mg via INTRAVENOUS
  Filled 2017-10-08 (×5): qty 1250

## 2017-10-08 MED ORDER — INSULIN ASPART 100 UNIT/ML ~~LOC~~ SOLN
0.0000 [IU] | Freq: Every day | SUBCUTANEOUS | Status: DC
  Administered 2017-10-09: 2 [IU] via SUBCUTANEOUS

## 2017-10-08 NOTE — Progress Notes (Signed)
Received report on pt.

## 2017-10-08 NOTE — ED Triage Notes (Signed)
Patient complains of a post operative infection after having a number of the toes in his left foot surgically straightened one month ago. Report he has been prescribed multiple antibiotics, is allergic to several antibiotics, and that the infection has only gotten worse. History of diabetes. Denies pain except at night when he lays down. Patient alert, oriented, and in no apparent distress at this time.

## 2017-10-08 NOTE — ED Notes (Signed)
I Stat Lac Acid results of 2.25 reported to Dr. Adela LankFloyd

## 2017-10-08 NOTE — ED Notes (Signed)
I Stat Lac Acid results of 2.23 reported to Lennar CorporationKelly Moon RN

## 2017-10-08 NOTE — ED Triage Notes (Signed)
ADm MD at bed side to talk with PT. Unable to move PT .

## 2017-10-08 NOTE — Progress Notes (Signed)
Pharmacy Antibiotic Note  William Cameron is a 67 y.o. male admitted on 10/08/2017 with wound infection s/p procedure on toe last month. He has been off and on oral antibiotics over the past month. CXR of toe shows "hightly suspicious" for osteomyelitis. SCr 1.5, eCrCl ~60 ml/min. LA 2.2.     Plan: -Flagyl 500 mg IV q8h -Aztreonam 2 g IV q8h -Vancomycin 2500 mg IV x1 then 1250 mg IV q12h -Monitor renal fx, cultures, VT at steady state   Height: 6' (182.9 cm) Weight: (!) 341 lb 11.4 oz (155 kg) IBW/kg (Calculated) : 77.6  Temp (24hrs), Avg:98.4 F (36.9 C), Min:97.9 F (36.6 C), Max:98.7 F (37.1 C)  Recent Labs  Lab 10/08/17 1156 10/08/17 1209 10/08/17 1353  WBC 7.8  --   --   CREATININE 1.55*  --   --   LATICACIDVEN  --  2.23* 2.25*     Antimicrobials this admission: 7/15 flagyl > 7/15 aztreonam > 7/15 vancomycin >   Dose adjustments this admission: N/A   Microbiology results: N/A    William Cameron, William Cameron 10/08/2017 4:04 PM

## 2017-10-08 NOTE — H&P (Signed)
History and Physical    William Cameron XBJ:[id number] DOB: [birth year] DOA: 10/08/2017  PCP: Ralene Ok, MD Patient coming from: home  Chief Complaint: left foot infection  HPI: William Cameron is a 67 y.o. male with medical history significant of for diabetes, obesity, obstructive sleep apnea on C Pap, hypertension, COPD not on home oxygen, obesity, stroke visits emergency Department chief complaint left foot swelling erythema pain. Initial evaluation includes x-ray of that left foot concerning for osteomyelitis.  Information is obtained from the chart and the patient and the wife is at the bedside. Patient had hammertoe correction surgery last month. He reports initially the healing went well but a staple was left behind. The wife removed the staple. He developed erythema and swelling. He reports since his surgery he's been on multiple antibiotics off and on. Most recently he was on Levaquin with little improvement. He saw his podiatrist today noting that the erythema swelling and pain continued to worsen in spite of 4 days of Levaquin. Podiatry recommended he come to the emergency department. He denies headache dizziness syncope or near-syncope. He denies fever chills nausea vomiting diarrhea constipation. He denies dysuria hematuria frequency or urgency.  ED Course: emergency department he's afebrile hemodynamically stable and not hypoxic. He has multiple allergies to antibiotics. Initially he was provided with vancomycin and Cleocin  Review of Systems: As per HPI otherwise all other systems reviewed and are negative.   Ambulatory Status: at baseline ambulates independently is independent with ADLs  Past Medical History:  Diagnosis Date  . Bronchitis    chronic bronchitis  . COPD (chronic obstructive pulmonary disease) (HCC)   . Diabetes mellitus without complication (HCC)   . Elevated lactic acid level   . Hypertension   . PONV (postoperative nausea and vomiting)    post op nausea    . 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   . Sleep apnea    cpap at night    Past Surgical History:  Procedure Laterality Date  . CARDIOVERSION N/A 01/25/2016   Procedure: CARDIOVERSION;  Surgeon: Yates Decamp, MD;  Location[facility]COPY;  Service: Cardiovascular;  Laterality: N/A;  . COLONOSCOPY  02/02/2012   Procedure: COLONOSCOPY;  Surgeon: Theda Belfast, MD;  Location: [facility] ENDOSCOPY;  Service: Endoscopy;  Laterality: N/A;  . HERNIA REPAIR    . HYDROCELE EXCISION / REPAIR    . JOINT REPLACEMENT     rt knee  . ROTATOR CUFF REPAIR  rt  . TOTAL HIP ARTHROPLASTY Right 07/14/2014   Procedure: RIGHT TOTAL HIP ARTHROPLASTY;  Surgeon: Durene Romans, MD;  Location: [facility] ORS;  Service: Orthopedics;  Laterality: Right;    Social History   Socioeconomic History  . Marital status: Married    Spouse name: [name]  . Number of children: 1  . Years of education: 12+  . Highest education level: Not on file  Occupational History    Employer: [employer]  Social Needs  . Financial resource strain: Not on file  . Food insecurity:    Worry: Not on file    Inability: Not on file  . Transportation needs:    Medical: Not on file    Non-medical: Not on file  Tobacco Use  . Smoking status: Never Smoker  . Smokeless tobacco: Never Used  Substance and Sexual Activity  . Alcohol use: No  . Drug use: No  .  Sexual activity: Never  Lifestyle  . Physical activity:    Days per week: Not on file    Minutes per session: Not on file  . Stress: Not on file  Relationships  . Social connections:    Talks on phone: Not on file    Gets together: Not on file    Attends religious service: Not on file    Active member of club or organization: Not on file    Attends meetings of clubs or organizations: Not on file    Relationship status: Not on file  . Intimate partner violence:     Fear of current or ex partner: Not on file    Emotionally abused: Not on file    Physically abused: Not on file    Forced sexual activity: Not on file  Other Topics Concern  . Not on file  Social History Narrative   Patient is married [name]) and lives at home with his wife.   Patient has one adult child.   Patient is working full-time.   Patient has a high school and auto classes.   Patient is right-handed.   Patient drinks two cups of coffee every morning and very little tea.    Allergies  Allergen Reactions  . Penicillins Anaphylaxis    Has patient had a PCN reaction causing immediate rash, facial/tongue/throat swelling, SOB or lightheadedness with hypotension: Yes Has patient had a PCN reaction causing severe rash involving mucus membranes or skin necrosis: Yes Has patient had a PCN reaction that required hospitalization Yes Has patient had a PCN reaction occurring within the last 10 years: No If all of the above answers are "NO", then may proceed with Cephalosporin use.  . Tetanus Toxoids Swelling    Can't breathe  . Sulfa Antibiotics Swelling    Swelling and nausea    Family History  Problem Relation Age of Onset  . Diabetes type II Mother   . Prostate cancer Father 42    Prior to Admission medications   Medication Sig Start Date End Date Taking? Authorizing Provider  amLODipine (NORVASC) 5 MG tablet Take 5 mg by mouth daily.    [provider]  apixaban (ELIQUIS) 5 MG TABS tablet Take 5 mg by mouth 2 (two) times daily.    [provider]  benazepril (LOTENSIN) 40 MG tablet Take 20 mg by mouth every evening.     [provider]  cholecalciferol (VITAMIN D) 1000 units tablet Take 2,000 Units by mouth daily.     [provider]  doxycycline (VIBRA-TABS) 100 MG tablet Take 1 tablet (100 mg total) by mouth 2 (two) times daily. 09/12/17   Felecia Shelling, DPM  FARXIGA 5 MG TABS Take 1 tablet by mouth daily.  07/30/13   [provider]  Fenofibric Acid 105 MG TABS Take 1 tablet by mouth daily. Reported on 06/02/2015 08/30/14   [provider]  gentamicin cream (GARAMYCIN) 0.1 % Apply 1 application topically 3 (three) times daily. 07/30/17   Felecia Shelling, DPM  ibuprofen (ADVIL,MOTRIN) 200 MG tablet Take 400 mg by mouth every 6 (six) hours as needed for mild pain.     [provider]  levofloxacin (LEVAQUIN) 750 MG tablet Take 1 tablet (750 mg total) by mouth daily. 10/01/17   Felecia Shelling, DPM  metoprolol succinate (TOPROL-XL) 50 MG 24 hr tablet Take 50 mg by mouth daily. Take with or immediately following a meal.    [provider]  NYSTATIN  powder  05/04/17   [provider]  traMADol Janean Sark) 50 MG tablet  07/10/17   [provider]  traMADol (ULTRAM) 50 MG tablet Take one tablet every 8 hours prn foot pain. 09/10/17   Felecia Shelling, DPM    Physical Exam: Vitals:   10/08/17 1147 10/08/17 1315 10/08/17 1535 10/08/17 1543  BP: 107/76 113/63 109/83 109/83  Pulse: 65 72 66 (!) 55  Resp: 16  18 18   Temp: 97.9 F (36.6 C)  98.7 F (37.1 C) 98.7 F (37.1 C)  TempSrc: Oral  Oral   SpO2: 98% 98% 99% 99%  Weight:   (!) 155 kg (341 lb 11.4 oz)   Height:   6' (1.829 m)      General:  Appears calm and comfortable, morbidly obese sitting up in bed in no acute distress Eyes:  PERRL, EOMI, normal lids, iris ENT:  grossly normal hearing, lips & tongue, mucous membranes of his mouth are pink slightly dry Neck:  no LAD, masses or thyromegaly Cardiovascular:  RRR, no m/r/g. No LE edema.  Respiratory:  CTA bilaterally, no w/r/r. Normal respiratory effort. Abdomen:  soft, ntnd, obese positive bowel sounds throughout no guarding or rebounding Skin:  no rash or induration seen on limited exam Musculoskeletal:  Left foot with swelling and erythema particularly toes and ball of foot anteriorly extending almost to the ankle2 small open wounds anterior foot with bloody drainage. No  fluctuance. Psychiatric:  grossly normal mood and affect, speech fluent and appropriate, AOx3 Neurologic:  CN 2-12 grossly intact, moves all extremities in coordinated fashion, sensation intact  Labs on Admission: I have personally reviewed following labs and imaging studies  CBC: Recent Labs  Lab 10/08/17 1156  WBC 7.8  NEUTROABS 5.4  HGB 16.6  HCT 52.5*  MCV 91.8  PLT 212   Basic Metabolic Panel: Recent Labs  Lab 10/08/17 1156  NA 136  K 4.9  CL 101  CO2 25  GLUCOSE 170*  BUN 20  CREATININE 1.55*  CALCIUM 10.0   GFR: Estimated Creatinine Clearance: 71 mL/min (A) (by C-G formula based on SCr of 1.55 mg/dL (H)). Liver Function Tests: Recent Labs  Lab 10/08/17 1156  AST 19  ALT 16  ALKPHOS 54  BILITOT 0.6  PROT 8.1  ALBUMIN 3.9   No results for input(s): LIPASE, AMYLASE in the last 168 hours. No results for input(s): AMMONIA in the last 168 hours. Coagulation Profile: No results for input(s): INR, PROTIME in the last 168 hours. Cardiac Enzymes: No results for input(s): CKTOTAL, CKMB, CKMBINDEX, TROPONINI in the last 168 hours. BNP (last 3 results) No results for input(s): PROBNP in the last 8760 hours. HbA1C: Recent Labs    10/08/17 1156  HGBA1C 7.6*   CBG: No results for input(s): GLUCAP in the last 168 hours. Lipid Profile: No results for input(s): CHOL, HDL, LDLCALC, TRIG, CHOLHDL, LDLDIRECT in the last 72 hours. Thyroid Function Tests: No results for input(s): TSH, T4TOTAL, FREET4, T3FREE, THYROIDAB in the last 72 hours. Anemia Panel: No results for input(s): VITAMINB12, FOLATE, FERRITIN, TIBC, IRON, RETICCTPCT in the last 72 hours. Urine analysis:    Component Value Date/Time   COLORURINE YELLOW 10/08/2017 1420   APPEARANCEUR CLEAR 10/08/2017 1420   LABSPEC 1.027 10/08/2017 1420   PHURINE 5.0 10/08/2017 1420   GLUCOSEU >=500 (A) 10/08/2017 1420   HGBUR NEGATIVE 10/08/2017 1420   BILIRUBINUR NEGATIVE 10/08/2017 1420   KETONESUR NEGATIVE  10/08/2017 1420   PROTEINUR NEGATIVE 10/08/2017 1420  UROBILINOGEN 0.2 07/08/2014 0845   NITRITE NEGATIVE 10/08/2017 1420   LEUKOCYTESUR NEGATIVE 10/08/2017 1420    Creatinine Clearance: Estimated Creatinine Clearance: 71 mL/min (A) (by C-G formula based on SCr of 1.55 mg/dL (H)).  Sepsis Labs: @LABRCNTIP (procalcitonin:4,lacticidven:4) )No results found for this or any previous visit (from the past 240 hour(s)).   Radiological Exams on Admission: Dg Foot Complete Left  Result Date: 10/08/2017 CLINICAL DATA:  67 year old male status post surgery last month with postoperative infection. Progressive infectious symptoms with pain. EXAM: LEFT FOOT - COMPLETE 3+ VIEW COMPARISON:  Postoperative radiographs 09/24/2017 and 09/12/2017. FINDINGS: Skin staples have been removed. K-wires previously traversing the 2nd through 3rd rays have been removed. Osteotomies of the 2nd through 3rd proximal phalanges at the distal meta diaphyses are un-healed. Interval loosening of 2 cannulated screws from the 1st metatarsal with associated cortical osteolysis and unhealed osteotomy. Dystrophic calcifications in the adjacent soft tissues. Severe associated soft tissue swelling in the distal left foot with no soft tissue gas. No other fracture or osteolysis. Stable calcaneus. IMPRESSION: 1. Appearance highly suspicious for Osteomyelitis in the left 1st metatarsal, with associated hardware loosening and un-healed versus eroded osteotomy site. 2. Un-healed 2nd through 3rd proximal phalanx osteotomies, but with no associated bone erosion. 3. Moderate to severe soft tissue swelling with no soft tissue gas. Electronically Signed   By: Odessa FlemingH  Hall M.D.   On: 10/08/2017 14:21    EKG:   Assessment/Plan Principal Problem:   Osteomyelitis (HCC) Active Problems:   Essential hypertension   DM (diabetes mellitus), type 2, uncontrolled w/neurologic complication (HCC)   Elevated lactic acid level   Obesity with sleep apnea    Morbid obesity (HCC)   Stroke (HCC)   #1. Osteomyelitis. Patient is status post hammertoe repair last month. He's been on antibiotics off and on during that time an outpatient basis. Erythema swelling continued to worsen. X-ray reveals appearance highly suspicious for osteomyelitis in the left first metatarsal with associated hardware loosening and unhealed versus eroded posterior osteotomy site as well as to healed second and third proximal phalanx osteotomies no associated bony erosion and moderate to severe soft tissue swelling with no soft tissue gas. He is afebrile hemodynamically stable and not [email or web address] elevated lactic acid at 2.23. He is provided with antibiotics in the emergency department. I discussed his antibiotic allergies with ED pharmacy to discussed with ID pharmacist and recommended -Admission -Flagyl, Vanco, aztreonam -Gentle IV fluids -follow wound culture -Track lactic acid -hold eliquis -Orthopedic consult  #2. Hypertension. Controlled in the emergency department. Medications include amlodipine, benazopril, metoprolol -hold home meds until tomorrow -monitor BP  3. Diabetes. Serum glucose 170. Hemoglobin A1c 7.6 -Hold oral agents for now -Sliding scale insulin for optimal control -Carb modified diet  #4. Elevated lactic acid. Lactic acid 2.2 on admission. No signs of sepsis. No fever no leukocytosis hemodynamically stable and not hypoxic. -IV fluids -Track lactic acid -Provide excuse noted above -obtain blood cultures if he spikes a fever  #5. Obesity/sleep apnea. Uses see Pap -Respiratory therapy  6. Patient with a history of stroke. I medications include Ahlquist. No deficits.    DVT prophylaxis: scd  Code Status: full  Family Communication: wife at bedside  Disposition Plan: [facility]  Consults called: home  Admission status: obs    Toya SmothersBLACK,Bliss Behnke M MD [facility]  If 7PM-7AM, please contact  night-coverage [email or web address] Password [email or web address]  10/08/2017, 4:18 PM

## 2017-10-08 NOTE — ED Provider Notes (Signed)
[facility] Provider Note   CSN: [id number] Arrival date & time: 10/08/17  1114     History   Chief Complaint Chief Complaint  Patient presents with  . Post-op Problem    HPI William Cameron is a 67 y.o. male.  1967 yoM with a chief complaint of a foot wound.  This been an ongoing issue for the past month or so.  He has been on and off multiple antibiotics.  Most recently was started on Levaquin about 3 or 4 days ago.  Saw his podiatrist again today was concerned that it was worsening and was sent here for further evaluation.  The patient states that they did a probe test in the office though he is not sure how far he was able to go.  He does think that he struck bone.  He denies any purulent drainage denies fevers.  The history is provided by the patient and the spouse.  Illness  This is a new problem. The current episode started more than 1 week ago. The problem occurs constantly. The problem has been rapidly worsening. Pertinent negatives include no chest pain, no abdominal pain, no headaches and no shortness of breath. Nothing aggravates the symptoms. Nothing relieves the symptoms. He has tried nothing for the symptoms. The treatment provided no relief.    Past Medical History:  Diagnosis Date  . Bronchitis    chronic bronchitis  . COPD (chronic obstructive pulmonary disease) (HCC)   . Diabetes mellitus without complication (HCC)   . Elevated lactic acid level   . Hypertension   . PONV (postoperative nausea and vomiting)    post op nausea  . 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   . Sleep apnea    cpap at night    Patient Active Problem List   Diagnosis Date Noted  . Diabetic foot infection (HCC) 10/08/2017  . Elevated lactic acid level   . Insomnia with sleep apnea 01/24/2016  . Atrial fibrillation, new onset  (HCC) 11/12/2015  . Essential hypertension 10/05/2014  . DM (diabetes mellitus), type 2, uncontrolled w/neurologic complication (HCC) 10/05/2014  . Morbid obesity (HCC) 07/15/2014  . 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 OSA on CPAP 10/21/2013  . Obesity with sleep apnea 10/21/2013  . Severe obesity (BMI >= 40) (HCC) 09/03/2013    Past Surgical History:  Procedure Laterality Date  . CARDIOVERSION N/A 01/25/2016   Procedure: CARDIOVERSION;  Surgeon: Yates DecampJay Ganji, MD;  Location: [facility] ENDOSCOPY;  Service: Cardiovascular;  Laterality: N/A;  . COLONOSCOPY  02/02/2012   Procedure: COLONOSCOPY;  Surgeon: Theda BelfastPatrick D Hung, MD;  Location: [facility] ENDOSCOPY;  Service: Endoscopy;  Laterality: N/A;  . HERNIA REPAIR    . HYDROCELE EXCISION / REPAIR    . JOINT REPLACEMENT     rt knee  . ROTATOR CUFF REPAIR  rt  . TOTAL HIP ARTHROPLASTY Right 07/14/2014   Procedure: RIGHT TOTAL HIP ARTHROPLASTY;  Surgeon: Durene RomansMatthew Olin, MD;  Location: [facility] ORS;  Service: Orthopedics;  Laterality: Right;        Home Medications    Prior to Admission medications   Medication Sig Start Date End Date Taking? Authorizing Provider  amLODipine (NORVASC) 5 MG tablet Take 5 mg by mouth daily.    [provider]  apixaban (ELIQUIS) 5 MG TABS tablet Take 5 mg by mouth 2 (two) times  daily.    [provider]  benazepril (LOTENSIN) 40 MG tablet Take 20 mg by mouth every evening.     [provider]  cholecalciferol (VITAMIN D) 1000 units tablet Take 2,000 Units by mouth daily.     [provider]  doxycycline (VIBRA-TABS) 100 MG tablet Take 1 tablet (100 mg total) by mouth 2 (two) times daily. 09/12/17   Felecia Shelling, DPM  FARXIGA 5 MG TABS Take 1 tablet by mouth daily.  07/30/13   [provider]  Fenofibric Acid 105 MG TABS Take 1 tablet by mouth daily. Reported on 06/02/2015 08/30/14   [provider]  gentamicin cream (GARAMYCIN) 0.1 % Apply 1 application topically 3 (three) times daily.  07/30/17   Felecia Shelling, DPM  ibuprofen (ADVIL,MOTRIN) 200 MG tablet Take 400 mg by mouth every 6 (six) hours as needed for mild pain.     [provider]  levofloxacin (LEVAQUIN) 750 MG tablet Take 1 tablet (750 mg total) by mouth daily. 10/01/17   Felecia Shelling, DPM  metoprolol succinate (TOPROL-XL) 50 MG 24 hr tablet Take 50 mg by mouth daily. Take with or immediately following a meal.    [provider]  NYSTATIN powder  05/04/17   [provider]  traMADol Janean Sark) 50 MG tablet  07/10/17   [provider]  traMADol (ULTRAM) 50 MG tablet Take one tablet every 8 hours prn foot pain. 09/10/17   Felecia Shelling, DPM    Family History Family History  Problem Relation Age of Onset  . Diabetes type II Mother   . Prostate cancer Father 38    Social History Social History   Tobacco Use  . Smoking status: Never Smoker  . Smokeless tobacco: Never Used  Substance Use Topics  . Alcohol use: No  . Drug use: No     Allergies   Penicillins; Tetanus toxoids; and Sulfa antibiotics   Review of Systems Review of Systems  Constitutional: Negative for chills and fever.  HENT: Negative for congestion and facial swelling.   Eyes: Negative for discharge and visual disturbance.  Respiratory: Negative for shortness of breath.   Cardiovascular: Negative for chest pain and palpitations.  Gastrointestinal: Negative for abdominal pain, diarrhea and vomiting.  Musculoskeletal: Negative for arthralgias and myalgias.  Skin: Positive for color change and wound. Negative for rash.  Neurological: Negative for tremors, syncope and headaches.  Psychiatric/Behavioral: Negative for confusion and dysphoric mood.     Physical Exam Updated Vital Signs BP 107/76 (BP Location: Left Arm)   Pulse 65   Temp 97.9 F (36.6 C) (Oral)   Resp 16   SpO2 98%   Physical Exam  Constitutional: He is oriented to person, place, and time. He appears well-developed and well-nourished.    HENT:  Head: Normocephalic and atraumatic.  Eyes: Pupils are equal, round, and reactive to light. EOM are normal.  Neck: Normal range of motion. Neck supple. No JVD present.  Cardiovascular: Normal rate and regular rhythm. Exam reveals no gallop and no friction rub.  No murmur heard. Pulmonary/Chest: No respiratory distress. He has no wheezes.  Abdominal: He exhibits no distension. There is no rebound and no guarding.  Musculoskeletal: Normal range of motion. He exhibits edema.  Significant edema to the left lower extremity that stops just about the ankle.  Erythematous, 2 wounds that are nonhealing to the MTP of the second digit and the midfoot of the first.  There is no appreciable drainage.  No  fluctuance.  Decreased sensation to the first webspace, otherwise intact pulse and sensation.  Neurological: He is alert and oriented to person, place, and time.  Skin: No rash noted. No pallor.  Psychiatric: He has a normal mood and affect. His behavior is normal.  Nursing note and vitals reviewed.    ED Treatments / Results  Labs (all labs ordered are listed, but only abnormal results are displayed) Labs Reviewed  COMPREHENSIVE METABOLIC PANEL - Abnormal; Notable for the following components:      Result Value   Glucose, Bld 170 (*)    Creatinine, Ser 1.55 (*)    GFR calc non Af Amer 45 (*)    GFR calc Af Amer 52 (*)    All other components within normal limits  CBC WITH DIFFERENTIAL/PLATELET - Abnormal; Notable for the following components:   HCT 52.5 (*)    All other components within normal limits  I-STAT CG4 LACTIC ACID, ED - Abnormal; Notable for the following components:   Lactic Acid, Venous 2.23 (*)    All other components within normal limits  I-STAT CG4 LACTIC ACID, ED - Abnormal; Notable for the following components:   Lactic Acid, Venous 2.25 (*)    All other components within normal limits    EKG None  Radiology No results found.  Procedures Procedures  (including critical care time)  Medications Ordered in ED Medications  clindamycin (CLEOCIN) IVPB 600 mg (has no administration in time range)  ciprofloxacin (CIPRO) IVPB 400 mg (has no administration in time range)  vancomycin (VANCOCIN) IVPB 1000 mg/200 mL premix (has no administration in time range)     Initial Impression / Assessment and Plan / ED Course  I have reviewed the triage vital signs and the nursing notes.  Pertinent labs & imaging results that were available during my care of the patient were reviewed by me and considered in my medical decision making (see chart for details).     67 yo M with a chief complaint of a being wound to the left foot.  This is been an ongoing issue for the past month.  He has been off and on different antibiotics.  Worsening over the past week.  Saw his podiatrist today who sent him to the ED for evaluation.  He is a mildly elevated lactic acid level.  White count is normal afebrile.  The wound has some significant edema there is no purulent drainage from what I can tell though if this was examined with some significant pressure per the patient just prior to his arrival here.  We will broaden his antibiotics.  Discussed with the hospitalist for admission.   The patients results and plan were reviewed and discussed.   Any x-rays performed were independently reviewed by myself.   Differential diagnosis were considered with the presenting HPI.  Medications  clindamycin (CLEOCIN) IVPB 600 mg (has no administration in time range)  ciprofloxacin (CIPRO) IVPB 400 mg (has no administration in time range)  vancomycin (VANCOCIN) IVPB 1000 mg/200 mL premix (has no administration in time range)    Vitals:   10/08/17 1147  BP: 107/76  Pulse: 65  Resp: 16  Temp: 97.9 F (36.6 C)  TempSrc: Oral  SpO2: 98%    Final diagnoses:  Diabetic foot infection (HCC)    Admission/ observation were discussed with the admitting physician, patient and/or  family and they are comfortable with the plan.   Final Clinical Impressions(s) / ED Diagnoses   Final diagnoses:  Diabetic foot  infection [facility])    ED Discharge Orders    None       Melene Plan, DO 10/08/17 1417

## 2017-10-09 DIAGNOSIS — G4730 Sleep apnea, unspecified: Secondary | ICD-10-CM

## 2017-10-09 DIAGNOSIS — E1149 Type 2 diabetes mellitus with other diabetic neurological complication: Secondary | ICD-10-CM

## 2017-10-09 DIAGNOSIS — Z833 Family history of diabetes mellitus: Secondary | ICD-10-CM

## 2017-10-09 DIAGNOSIS — Z9889 Other specified postprocedural states: Secondary | ICD-10-CM

## 2017-10-09 DIAGNOSIS — R7989 Other specified abnormal findings of blood chemistry: Secondary | ICD-10-CM

## 2017-10-09 DIAGNOSIS — E669 Obesity, unspecified: Secondary | ICD-10-CM

## 2017-10-09 DIAGNOSIS — I10 Essential (primary) hypertension: Secondary | ICD-10-CM

## 2017-10-09 DIAGNOSIS — Z882 Allergy status to sulfonamides status: Secondary | ICD-10-CM

## 2017-10-09 DIAGNOSIS — Z880 Allergy status to penicillin: Secondary | ICD-10-CM

## 2017-10-09 DIAGNOSIS — B9561 Methicillin susceptible Staphylococcus aureus infection as the cause of diseases classified elsewhere: Secondary | ICD-10-CM

## 2017-10-09 DIAGNOSIS — J449 Chronic obstructive pulmonary disease, unspecified: Secondary | ICD-10-CM

## 2017-10-09 DIAGNOSIS — G4733 Obstructive sleep apnea (adult) (pediatric): Secondary | ICD-10-CM

## 2017-10-09 DIAGNOSIS — Z887 Allergy status to serum and vaccine status: Secondary | ICD-10-CM

## 2017-10-09 DIAGNOSIS — Z1629 Resistance to other single specified antibiotic: Secondary | ICD-10-CM

## 2017-10-09 DIAGNOSIS — L03119 Cellulitis of unspecified part of limb: Secondary | ICD-10-CM

## 2017-10-09 DIAGNOSIS — E1165 Type 2 diabetes mellitus with hyperglycemia: Secondary | ICD-10-CM

## 2017-10-09 DIAGNOSIS — L089 Local infection of the skin and subcutaneous tissue, unspecified: Secondary | ICD-10-CM

## 2017-10-09 DIAGNOSIS — E11628 Type 2 diabetes mellitus with other skin complications: Secondary | ICD-10-CM

## 2017-10-09 DIAGNOSIS — M86172 Other acute osteomyelitis, left ankle and foot: Secondary | ICD-10-CM

## 2017-10-09 DIAGNOSIS — L03116 Cellulitis of left lower limb: Secondary | ICD-10-CM

## 2017-10-09 DIAGNOSIS — L02619 Cutaneous abscess of unspecified foot: Secondary | ICD-10-CM

## 2017-10-09 LAB — BASIC METABOLIC PANEL
Anion gap: 10 (ref 5–15)
BUN: 20 mg/dL (ref 8–23)
CALCIUM: 9.4 mg/dL (ref 8.9–10.3)
CO2: 24 mmol/L (ref 22–32)
CREATININE: 1.57 mg/dL — AB (ref 0.61–1.24)
Chloride: 102 mmol/L (ref 98–111)
GFR calc non Af Amer: 44 mL/min — ABNORMAL LOW (ref >60)
GFR, EST AFRICAN AMERICAN: 51 mL/min — AB (ref >60)
GLUCOSE: 147 mg/dL — AB (ref 70–99)
Potassium: 4.1 mmol/L (ref 3.5–5.1)
Sodium: 136 mmol/L (ref 135–145)

## 2017-10-09 LAB — GLUCOSE, CAPILLARY
GLUCOSE-CAPILLARY: 159 mg/dL — AB (ref 70–99)
GLUCOSE-CAPILLARY: 204 mg/dL — AB (ref 70–99)
Glucose-Capillary: 139 mg/dL — ABNORMAL HIGH (ref 70–99)
Glucose-Capillary: 157 mg/dL — ABNORMAL HIGH (ref 70–99)

## 2017-10-09 LAB — CBC
HCT: 46.4 % (ref 39.0–52.0)
Hemoglobin: 14.9 g/dL (ref 13.0–17.0)
MCH: 28.9 pg (ref 26.0–34.0)
MCHC: 32.1 g/dL (ref 30.0–36.0)
MCV: 90.1 fL (ref 78.0–100.0)
PLATELETS: 195 10*3/uL (ref 150–400)
RBC: 5.15 MIL/uL (ref 4.22–5.81)
RDW: 12.6 % (ref 11.5–15.5)
WBC: 8.7 10*3/uL (ref 4.0–10.5)

## 2017-10-09 LAB — HIV ANTIBODY (ROUTINE TESTING W REFLEX): HIV Screen 4th Generation wRfx: NONREACTIVE

## 2017-10-09 MED ORDER — LINEZOLID 600 MG PO TABS: 600.0000 mg | ORAL_TABLET | Freq: Two times a day (BID) | ORAL | 0 refills | Status: DC

## 2017-10-09 MED ORDER — JUVEN PO PACK
1.0000 | PACK | Freq: Two times a day (BID) | ORAL | Status: DC
  Administered 2017-10-09 – 2017-10-10 (×3): 1 via ORAL
  Filled 2017-10-09 (×3): qty 1

## 2017-10-09 MED ORDER — CHLORHEXIDINE GLUCONATE 4 % EX LIQD
60.0000 mL | Freq: Once | CUTANEOUS | Status: DC
  Filled 2017-10-09: qty 60

## 2017-10-09 NOTE — Progress Notes (Signed)
Inpatient Diabetes Program   AACE/ADA: New Consensus Statement on Inpatient Glycemic Control (2015)  Target Ranges:  Prepandial:   less than 140 mg/dL      Peak postprandial:   less than 180 mg/dL (1-2 hours)      Critically ill patients:  140 - 180 mg/dL   Spoke with patient about diabetes and home regimen for diabetes control. Patient reports that he is followed by his PCP for DM management and sees him every 3 months. Patient reports that he takes [place] every day. 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 Patient reports since his surgery he has been laid up and not able to be active like he use to. Patient also reports gaining weight during this time. Discussed with patient to start chair exercises and demonstrated many options. Discussed with patient to check his glucose 1-2 times a day. Patient will need a glucometer at time of d/c (order # [id number]).  Patient reports he last saw his PCP a little over 1 month ago and his A1c was lower. Discussed current A1c result 7.6%. Discussed glucose and A1C goals. Discussed importance of checking CBGs and maintaining good CBG control to prevent long-term and short-term complications. Explained how hyperglycemia leads to damage within blood vessels which lead to the common complications seen with uncontrolled diabetes. Stressed to the patient the importance of improving glycemic control to prevent further complications from uncontrolled diabetes. Discussed impact of nutrition, exercise, stress, sickness, and medications on diabetes control.   Patient verbalized understanding of information discussed and he states that he has no further questions at this time related to diabetes.   Thanks,  Christena DeemShannon Saquan Furtick RN, MSN, BC-ADM Inpatient Diabetes Coordinator Team Pager [phone] (8a-5p)

## 2017-10-09 NOTE — Progress Notes (Signed)
Patient has home CPAP at bedside. No assistance needed from Respiratory at this time.  

## 2017-10-09 NOTE — Consult Note (Signed)
WOC Nurse wound consult note Reason for Consult: POst operative  infection to left dorsal foot.  Wound type: Surgical  Pressure Injury POA: NA Measurement: Left dorsal foot at first metatarsal head, dehisced staple site (likely 2-3 staples' length) Wound bed: Ruddy red Drainage (amount, consistency, odor) moderate serosanguinous  No odor Periwound:edema and erythema to left dorsal foot. Scabbed lesions to second and fourth metatarsal. Dressing procedure/placement/frequency: Cleanse wounds to left dorsal foot with soap and water.  Apply Xeroform to wound bed.Cover with ABD pad and kerlix/tape.  Change daily.  Will not follow at this time.  Please re-consult if needed.  Maple HudsonKaren Ankur Snowdon RN BSN CWON Pager [phone]

## 2017-10-09 NOTE — Plan of Care (Signed)
Pt be informed of his POC and to be free or reduced of pain.

## 2017-10-09 NOTE — Progress Notes (Signed)
Patient has home CPAP unit at bedside. No frayed wires. No further assistance needed from Respiratory. Patient is able to place himself on/off as needed.

## 2017-10-09 NOTE — Progress Notes (Signed)
Initial Nutrition Assessment  DOCUMENTATION CODES:   Morbid obesity  INTERVENTION:  -1 packet Juven BID, each packet provides 80 calories, 8 grams of carbohydrate, and 14 grams of amino acids; supplement contains CaHMB, glutamine, and arginine, to promote wound healing  Double protein portions at meals  NUTRITION DIAGNOSIS:   Increased nutrient needs related to wound healing as evidenced by estimated needs.  GOAL:   Patient will meet greater than or equal to 90% of their needs  MONITOR:   PO intake, Supplement acceptance, I & O's  REASON FOR ASSESSMENT:   Consult Assessment of nutrition requirement/status  ASSESSMENT:   Mr. Charyl DancerGant has a PMH OSA, morbid obesity, HTN, DM and COPD, presents with foot infection. He had podiatry surgery for hammer toes on his L foot on 6/13. Had external fixation devices removed 7/8 but has had progressive erythema and edema despite completing antibiotics. Now his foot is concerning for osteomyelitis, may need first ray amputation.  Spoke with patient at bedside. He reports no weight loss and good appetite at home despite his foot infection. He had some nausea with antibiotics as an outpatient but denies any loss of appetite or decrease in PO intake. Normally eats 3-4 meals per day. Ate a tenderloin biscuit this morning and also ate the breakfast provided to him from patient services. It seems like he needs more food at meals, will provide double protein portions. Also discussed with him consuming juven to improve wound healing. Encouraged eating more protein and less carbohydrates at meals to control his blood sugar and weight. He was amenable to making changes. Will monitor needs.  Medications reviewed and include:  Vitamin D, Insulin   Labs reviewed:  Cr 1.57 CBGs 166, 139  NUTRITION - FOCUSED PHYSICAL EXAM:    Most Recent Value  Orbital Region  No depletion  Upper Arm Region  No depletion  Thoracic and Lumbar Region  No depletion  Buccal  Region  No depletion  Temple Region  No depletion  Clavicle Bone Region  No depletion  Clavicle and Acromion Bone Region  No depletion  Scapular Bone Region  No depletion  Dorsal Hand  No depletion  Patellar Region  No depletion  Anterior Thigh Region  No depletion  Posterior Calf Region  No depletion  Edema (RD Assessment)  None  Hair  Reviewed  Eyes  Reviewed  Mouth  Reviewed  Skin  Reviewed  Nails  Reviewed       Diet Order:   Diet Order           Diet heart healthy/carb modified Room service appropriate? Yes; Fluid consistency: Thin  Diet effective now          EDUCATION NEEDS:   Education needs have been addressed  Skin:  Skin Assessment: Skin Integrity Issues: Skin Integrity Issues:: Diabetic Ulcer Diabetic Ulcer: Cellulitis, Weeping, Ecchymosis to R foot  Last BM:  10/08/2017  Height:   Ht Readings from Last 1 Encounters:  10/08/17 6' (1.829 m)    Weight:   Wt Readings from Last 1 Encounters:  10/08/17 (!) 341 lb 11.4 oz (155 kg)    Ideal Body Weight:  80.9 kg  BMI:  Body mass index is 46.34 kg/m.  Estimated Nutritional Needs:   Kcal:  2300-2500 calories  Protein:  135-155 grams  Fluid:  >2L    Dionne AnoWilliam M. Corrigan Kretschmer, MS, RD LDN Inpatient Clinical Dietitian Pager [phone]

## 2017-10-09 NOTE — Progress Notes (Signed)
HPI: 67 year old male with a history of diabetes mellitus controlled presents today status post bunionectomy and hammertoe repair of the second digit left foot.  Date of surgery 09/06/2017.  Patient has had significant amount of swelling with redness.  Cultures taken on 09/17/2017 and the patient is currently on Levaquin 750 mg daily based on cultures and sensitivity.  Patient says that the redness and swelling has increased.  He is still currently taking the antibiotics.  His temperature is currently 96.3 Fahrenheit.  There continues to be drainage to the surgical incision sites and he has been changing the dressings daily.  Past Medical History:  Diagnosis Date  . Bronchitis    chronic bronchitis  . COPD (chronic obstructive pulmonary disease) (HCC)   . Diabetes mellitus without complication (HCC)   . Elevated lactic acid level   . Hypertension   . PONV (postoperative nausea and vomiting)    post op nausea  . 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   . Sleep apnea    cpap at night      Physical Exam: General: The patient is alert and oriented x3 in no acute distress.  Dermatology: 2 focal areas of ulceration with drainage noted to the bunion incision site as well as the hammertoe incision site.  Purulent drainage noted.  No malodor noted.  No other skin breakdown noted.  Both ulcerations measure approximately 1 cm in diameter with an undetermined depth.  Patient is very sensate and depth is undetermined at this moment.  Vascular: Palpable pedal pulses bilaterally.  Heavy amount of erythema and edema noted to the surgical forefoot consistent with a deep cellulitis.  Neurological: Epicritic and protective threshold grossly intact bilaterally.   Musculoskeletal Exam: Status post bunionectomy and hammertoe repair second digit left foot  Assessment: 1.  Status post  bunionectomy and hammertoe repair left foot.  Date of surgery 09/06/2017. 2.  Cellulitis left forefoot   Plan of Care:  1. Patient evaluated. 2.  Today had a discussion with the patient and I recommend that he goes to the emergency department for further work-up.  Patient has failed oral antibiotic therapy based on cultures and sensitivities.  Patient may benefit from strong IV antibiotics.  Patient will also likely need incision and drainage of the abscess with possible removal of screw hardware to the surgical bunionectomy site.  Patient understands.  Patient and his wife said that they will go to the emergency department this afternoon. 3.  Today new cultures were taken and sent to pathology for culture and sensitivity 4.  Continue oral antibiotics at the moment. 5.  If the patient is admitted we will follow the patient while inpatient.  I will be out of town 10/09/2017 - 10/15/2017, but I will have one of my partners manage the patient while inpatient if the patient meets admission criteria 7.  Return to clinic in 1 week or post discharge      Felecia ShellingBrent M. Evans, DPM [facility]  Dr. Felecia ShellingBrent M. Evans, DPM    [address]                                        [place], [place] [id number]  Office [phone]  Fax [phone]

## 2017-10-09 NOTE — Progress Notes (Addendum)
PROGRESS NOTE  William MylarMichael E Korver  WUJ:[id number] DOB: [birth year] DOA: 10/08/2017 PCP: Ralene OkMoreira, Roy, MD  Outpatient Specialists: Podiatry, Dr. Logan BoresEvans Brief Narrative: William Cameron is a 67 y.o. male with a history of T2DM, HTN, morbid obesity, OSA, and COPD who presented to the ED for a foot infection. He had surgery for hammer toes on the left foor 6/13 with percutaneous fixation pins ultimately removed 7/8 with course complicated by progressive erythema, edema, and recently purulence. He had been given doxycycline and ciprofloxacin post operatively and changed to levaquin on 7/8. Despite this and fastidious wound care by his wife the pain, redness and swelling worsened prompting him to visit the podiatrist 7/15 where he was referred to the ED for evaluation. He was afebrile, vital signs not consistent with sepsis. WBC 8.7 with normal differential. Lactic acid 2.2, improved to 1.7, Very broad IV antibiotic coverage with vancomycin, aztreonam, and ciprofloxacin was started and orthopedics consulted. MRI showed evidence of diffuse edema of the forefoot consistent with cellulitis and/or postop change, hardware failure and bone marrow edema felt to be due to instrumentation +/- osteomyelitis as well as a small 2nd MTP joint infusion without bone destruction.   Assessment & Plan: Principal Problem:   Acute osteomyelitis of metatarsal bone of left foot (HCC) Active Problems:   Obesity with sleep apnea   Morbid obesity (HCC)   Essential hypertension   DM (diabetes mellitus), type 2, uncontrolled w/neurologic complication (HCC)   Elevated lactic acid level   Diabetic foot infection (HCC)   Osteomyelitis (HCC)   Stroke (HCC)  Left forefoot cellulitis: Complicating post-op recovery. Focal infection worsened despite po abx (though agents with poor coverage for typicals). No definite osteomyelitis on MRI (though difficult to tell with postoperative images), no fluid collection. Fortunately not septic and  clinically improving on IV antibiotics here. HIV NR. - Orthopedics note pending. Per pt report, no surgery is currently planned.  - Continue local wound care with soap and water, cover wounds with xeroform per WOC recommendations.  - Check ABIs - Continue IV antibiotics. Suspect narrower coverage would be effective. Has documented anaphylaxis to PCN, N/V/swelling reaction to sulfa. I've discussed with ID who will consult.   s/p bunionectomy and hammertoe repair 2nd digit left foot 6/13: With evidence of hardware failure on MRI.  - Per podiatry  T2DM: HbA1c 7.6% here indicating suboptimal control.  - Hold farxiga. Recommend PCP follow up for consideration of alternative agents which may cost less (pt pays $700 for this medication) - SSI for tight control.  - Dietitian and DM coordinator consulted  AFib: s/p DCCV by Dr. Jacinto HalimGanji. Currently with sinus arrhythmia and premature beats vs. AFib.  - Check ECG - Continue metoprolol - On eliquis for CHA2DS2-VASc score of at least 5 (HTN, age[1], DM, CVA)  HTN:  - Norvasc, benazepril, metoprolol  Obesity, OSA:  - Dietitian as above - CPAP qHS  History of CVA:  - Continue eliquis when able. Check lipid panel.   DVT prophylaxis: Restart eliquis once definitive plan is no surgery Code Status: Full Family Communication: Wife at bedside Disposition Plan: Home when improved  Consultants:   Orthopedics  Infectious diseases  Procedures:   None  Antimicrobials:  Vancomycin, aztreonam, ciprofloxacin 7/15 >>    Subjective: Feel foot pain is less, swelling and redness improved since admission. No fevers. Eating well.   Objective: Vitals:   10/08/17 1535 10/08/17 1543 10/08/17 2100 10/09/17 0500  BP: 109/83 109/83 115/74 108/68  Pulse: 66 (!) 55 68 (!)  55  Resp: 18 18 18 19   Temp: 98.7 F (37.1 C) 98.7 F (37.1 C) 99.3 F (37.4 C) 98.2 F (36.8 C)  TempSrc: Oral Oral Oral Oral  SpO2: 99% 99% 95% 96%  Weight: (!) 155 kg (341 lb  11.4 oz)     Height: 6' (1.829 m)       Intake/Output Summary (Last 24 hours) at 10/09/2017 1105 Last data filed at 10/09/2017 1610 Gross per 24 hour  Intake 2211.7 ml  Output 375 ml  Net 1836.7 ml   Filed Weights   10/08/17 1535  Weight: (!) 155 kg (341 lb 11.4 oz)    Gen: Obese 67 y.o. male in no distress  Pulm: Non-labored breathing room air. Clear to auscultation bilaterally.  CV: Sinus arrhythmia with premature beats, overall rate in 60's. No murmur, rub, or gallop. No JVD, no leg edema GI: Abdomen soft, non-tender, non-distended, with normoactive bowel sounds. No organomegaly or masses felt. Ext: Warm, no deformities. Left foot with diffuse edema and erythema within demarcations on dorsum. 2 wounds with serous discharge, no purulence expressible. +DP pulses bilaterally, strength and sensation intact at toes.  Skin: As above without ulcers Neuro: Alert and oriented. No focal neurological deficits. Psych: Judgement and insight appear normal. Mood & affect appropriate.   Data Reviewed: I have personally reviewed following labs and imaging studies  CBC: Recent Labs  Lab 10/08/17 1156 10/09/17 0240  WBC 7.8 8.7  NEUTROABS 5.4  --   HGB 16.6 14.9  HCT 52.5* 46.4  MCV 91.8 90.1  PLT 212 195   Basic Metabolic Panel: Recent Labs  Lab 10/08/17 1156 10/09/17 0240  NA 136 136  K 4.9 4.1  CL 101 102  CO2 25 24  GLUCOSE 170* 147*  BUN 20 20  CREATININE 1.55* 1.57*  CALCIUM 10.0 9.4   GFR: Estimated Creatinine Clearance: 70.1 mL/min (A) (by C-G formula based on SCr of 1.57 mg/dL (H)). Liver Function Tests: Recent Labs  Lab 10/08/17 1156  AST 19  ALT 16  ALKPHOS 54  BILITOT 0.6  PROT 8.1  ALBUMIN 3.9   No results for input(s): LIPASE, AMYLASE in the last 168 hours. No results for input(s): AMMONIA in the last 168 hours. Coagulation Profile: No results for input(s): INR, PROTIME in the last 168 hours. Cardiac Enzymes: No results for input(s): CKTOTAL, CKMB,  CKMBINDEX, TROPONINI in the last 168 hours. BNP (last 3 results) No results for input(s): PROBNP in the last 8760 hours. HbA1C: Recent Labs    10/08/17 1156  HGBA1C 7.6*   CBG: Recent Labs  Lab 10/08/17 1742 10/08/17 2234 10/09/17 0731  GLUCAP 130* 166* 139*   Lipid Profile: No results for input(s): CHOL, HDL, LDLCALC, TRIG, CHOLHDL, LDLDIRECT in the last 72 hours. Thyroid Function Tests: No results for input(s): TSH, T4TOTAL, FREET4, T3FREE, THYROIDAB in the last 72 hours. Anemia Panel: No results for input(s): VITAMINB12, FOLATE, FERRITIN, TIBC, IRON, RETICCTPCT in the last 72 hours. Urine analysis:    Component Value Date/Time   COLORURINE YELLOW 10/08/2017 1420   APPEARANCEUR CLEAR 10/08/2017 1420   LABSPEC 1.027 10/08/2017 1420   PHURINE 5.0 10/08/2017 1420   GLUCOSEU >=500 (A) 10/08/2017 1420   HGBUR NEGATIVE 10/08/2017 1420   BILIRUBINUR NEGATIVE 10/08/2017 1420   KETONESUR NEGATIVE 10/08/2017 1420   PROTEINUR NEGATIVE 10/08/2017 1420   UROBILINOGEN 0.2 07/08/2014 0845   NITRITE NEGATIVE 10/08/2017 1420   LEUKOCYTESUR NEGATIVE 10/08/2017 1420   No results found for this or any previous visit (  from the past 240 hour(s)).    Radiology Studies: Mr Foot Left W Wo Contrast  Result Date: 10/09/2017 CLINICAL DATA:  Diabetic foot swelling.  Osteomyelitis suspected. EXAM: MRI OF THE LEFT FOREFOOT WITHOUT AND WITH CONTRAST TECHNIQUE: Multiplanar, multisequence MR imaging of the left forefoot was performed both before and after administration of intravenous contrast. CONTRAST:  20mL MULTIHANCE GADOBENATE DIMEGLUMINE 529 MG/ML IV SOLN COMPARISON:  Radiographs dating back to 09/12/2017 FINDINGS: Bones/Joint/Cartilage Navicular, cuneiform and cuboid: No marrow signal abnormality to suggest osteomyelitis. First ray: Extensive marrow edema with artifacts from pre-existing fixation screws noted medial to the first metatarsal Chevron procedure and not embedded within bone. Findings  may represent hardware failure due to loosening and disengaged hardware. Stigmata of osteomyelitis and infection would be difficult to entirely exclude given the edema though the edema may simply reflect postop change. Second ray: Marrow edema of the second metatarsal head with small to moderate joint effusion of the first MTP joint. Reactive edema of the second digit with osteotomy defect involving the head of the second proximal phalanx. Similar to the first metatarsal, the marrow edema may simply reflect postoperative reactive edema from recent instrumentation. No frank bone destruction across the second MTP joint to suggest septic arthritis. Conclusive evidence for acute osteomyelitis. Third ray: Osteotomy defect involving the head of the third proximal phalanx with reactive edema of the distal and middle phalanx and to a lesser degree the third metatarsal head and neck. Findings likely represent postop reactive edema due to recent surgery and instrumentation. No conclusive evidence for acute osteomyelitis. Fourth ray: Linear edema along the course of the previously existing K-wire fixation hardware. Osteotomy defect involving the fourth proximal phalangeal head without conclusive evidence for acute osteomyelitis. Fifth ray: No marrow signal abnormalities. No postoperative defects. Ligaments Noncontributory Muscles and Tendons No muscle atrophy. Mild intramuscular edema consistent with myositis of the flexor and extensor muscles crossing the forefoot. No tenosynovitis or tendinopathy. Soft tissues Diffuse soft tissue swelling of the forefoot consistent with cellulitis and postoperative edema. No abscess. No drainable fluid collections IMPRESSION: 1. Diffuse soft tissue swelling of the forefoot is noted consistent with cellulitis and/or postop change. 2. Apparent hardware failure with disengaged fixation screws at site of bunionectomy/chevron type procedure involving the distal first metatarsal shaft. Extensive  marrow edema is seen some which is to be expected given recent surgery. Slightly indistinct appearing cortices raise the possibility of evolving osteomyelitis however. 3. Lesser degrees of marrow edema involving the second through fourth rays as above likely secondary to instrumentation and post osteotomy change. No frank bone destruction is seen. 4. Small joint effusion involving the second metatarsal phalangeal joint is identified without frank bone destruction. Septic arthritis is believed less likely given lack of frank bone destruction. Attention on follow-up or sampling of fluid is suggested. Electronically Signed   By: Tollie Eth M.D.   On: 10/09/2017 03:16   Dg Foot Complete Left  Result Date: 10/08/2017 CLINICAL DATA:  67 year old male status post surgery last month with postoperative infection. Progressive infectious symptoms with pain. EXAM: LEFT FOOT - COMPLETE 3+ VIEW COMPARISON:  Postoperative radiographs 09/24/2017 and 09/12/2017. FINDINGS: Skin staples have been removed. K-wires previously traversing the 2nd through 3rd rays have been removed. Osteotomies of the 2nd through 3rd proximal phalanges at the distal meta diaphyses are un-healed. Interval loosening of 2 cannulated screws from the 1st metatarsal with associated cortical osteolysis and unhealed osteotomy. Dystrophic calcifications in the adjacent soft tissues. Severe associated soft tissue swelling in the  distal left foot with no soft tissue gas. No other fracture or osteolysis. Stable calcaneus. IMPRESSION: 1. Appearance highly suspicious for Osteomyelitis in the left 1st metatarsal, with associated hardware loosening and un-healed versus eroded osteotomy site. 2. Un-healed 2nd through 3rd proximal phalanx osteotomies, but with no associated bone erosion. 3. Moderate to severe soft tissue swelling with no soft tissue gas. Electronically Signed   By: Odessa Fleming M.D.   On: 10/08/2017 14:21    Scheduled Meds: . amLODipine  5 mg Oral Daily   . benazepril  20 mg Oral QPM  . cholecalciferol  2,000 Units Oral Daily  . fenofibrate  54 mg Oral Daily  . insulin aspart  0-5 Units Subcutaneous QHS  . insulin aspart  0-9 Units Subcutaneous TID WC  . metoprolol succinate  50 mg Oral Daily   Continuous Infusions: . sodium chloride Stopped (10/08/17 1821)  . aztreonam 2 g (10/09/17 1031)  . metronidazole 500 mg (10/09/17 0849)  . vancomycin 1,250 mg (10/09/17 0529)     LOS: 1 day   Time spent: 35 minutes.  Tyrone Nine, MD [facility] [email or web address] Password [email or web address] 10/09/2017, 11:05 AM

## 2017-10-09 NOTE — Progress Notes (Addendum)
Patient went to MRI around 10:30 and he was wearing a medical alert necklace that was given to him by his mother.  When returned to floor he did not have his necklace and several attempts were made to call MRI.  I was able to reach someone around 5:00 a.m. And they could not locate the necklace.  This information will be given to the following shift in hopes they will be able to locate the necklace.    The necklace is octagon shape with penicillin and sulfur listed on the necklace.

## 2017-10-09 NOTE — Consult Note (Addendum)
[facility]    Date of Admission:  10/08/2017   Total days of antibiotics 2        Day 2 vancomycin, aztreonam, ciprofloxacin               Reason for Consult: Cellulitis ? Osteomyelitis L foot     Referring Provider: Jarvis Newcomer Primary Care Provider: Ralene Ok, MD   Assessment & Plan:  Cellulitis, Left Foot =  William Cameron is a 67 y.o. male with evidence of cellulitis following recent surgery involving the left foot to repair hammer toe and bunionectomy. On 7/8 he had pins removed. He has had improvement on vancomycin / aztreonam / cipro; superficial swab with MSSA that is resistant to doxycycline. I will stop ciprofloxacin and aztreonam with no history of recovered pseudomonas or other GNRs and focus on the staph. Not completely certain changes on his MRI are due to osteomyelitis vs recent surgical manipulation.  Rapid improvement in pain also supports soft tissue process vs osteomyelitis. I would like to continue treatment with vancomycin alone for maybe another 2 more days. Will try to submit PA to finish with 10-14 days of linezolid 600 mg BID vs infusion of Dalbavancin to be arranged weekly x 1-2 outpatient at infusion center. Will need to see what plan we can do based on submission to insurance prior to him leaving.  I have arranged for office follow up visit with me on Tuesday July 30th @ 11:00. May repeat xray at that time to re-evaluate duration of therapy.   Diabetes = his last A1C was < 8%; appreciate inpatient diabetes management team to help ensure regimen is best suited for good glycemic control   Health Maintenance = Would check Hep C for life-time screening    Principal Problem:   Acute osteomyelitis of metatarsal bone of left foot (HCC) Active Problems:   Obesity with sleep apnea   Morbid obesity (HCC)   Essential hypertension   DM (diabetes mellitus), type 2, uncontrolled w/neurologic complication (HCC)   Elevated lactic acid level   Diabetic foot infection (HCC)   Osteomyelitis (HCC)   Stroke (HCC)  . amLODipine  5 mg Oral Daily  . benazepril  20 mg Oral QPM  . cholecalciferol  2,000 Units Oral Daily  . fenofibrate  54 mg Oral Daily  . insulin aspart  0-5 Units Subcutaneous QHS  . insulin aspart  0-9 Units Subcutaneous TID WC  . metoprolol succinate  50 mg Oral Daily  . nutrition supplement (JUVEN)  1 packet Oral BID BM    HPI: William Cameron is a 67 y.o. male with past medical history detailed below but significant for morbid obesity, type 2 DM, OSA/COPD who presented to the emergency room on 10/08/2017 with foot infection.   Recently on 6/13 he underwent bunionectomy and hammer toe repair of the left #2 toe with percutaneous fixation pins. These were removed on 7/8 d/t progressive erythema, edema and drainage that had recently developed. Recent antibiotics included doxycycline + ciprofloxacin which were changed to levaquin on 7/8. He visited his podiatrist on 7/15 (Dr. Logan Bores) and sent to the ER for concern over osteomyelitis. At the time of presentation he was afebrile, stable hemodynamics and normal WBC count. Due to antibiotic allergies he was started on vancomycin + aztreonam + ciprofloxacin for broad spectrum coverage. MRI of the foot revealed diffuse edema of the forefoot c/w cellulitis and /or post-op changes, hardware failure and bone  marrow edema due to recent instrumentation vs early developing osteomyelitis.   Today after one night of antibiotics he reports significant improvement in his swelling and pain. He is now able to walk on his foot and no drainage "rolling out" where as before it was "trickling out like tears." He has had previous anaphylaxis to penicillins and although he cannot recall the sulfa drug he took in the past he reports breathing difficulties due to it.   Review of Systems  Constitutional: Positive for fever. Negative for chills and malaise/fatigue.  HENT: Negative for sore throat.   Eyes:  Negative for blurred vision.  Respiratory: Negative for cough, shortness of breath and wheezing.   Cardiovascular: Positive for leg swelling (Left foot ). Negative for chest pain.  Gastrointestinal: Negative for abdominal pain, diarrhea and vomiting.  Genitourinary: Negative for dysuria, frequency and hematuria.  Musculoskeletal: Positive for joint pain. Negative for myalgias.  Skin: Positive for rash.  Neurological: Negative for dizziness.    Past Medical History:  Diagnosis Date  . Bronchitis    chronic bronchitis  . COPD (chronic obstructive pulmonary disease) (HCC)   . Diabetes mellitus without complication (HCC)   . Elevated lactic acid level   . Hypertension   . PONV (postoperative nausea and vomiting)    post op nausea  . 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   . Sleep apnea    cpap at night    Social History   Tobacco Use  . Smoking status: Never Smoker  . Smokeless tobacco: Never Used  Substance Use Topics  . Alcohol use: No  . Drug use: No    Family History  Problem Relation Age of Onset  . Diabetes type II Mother   . Prostate cancer Father [id number]   Allergies  Allergen Reactions  . Penicillins Anaphylaxis    Has patient had a PCN reaction causing immediate rash, facial/tongue/throat swelling, SOB or lightheadedness with hypotension: Yes Has patient had a PCN reaction causing severe rash involving mucus membranes or skin necrosis: Yes Has patient had a PCN reaction that required hospitalization Yes Has patient had a PCN reaction occurring within the last 10 years: No If all of the above answers are "NO", then may proceed with Cephalosporin use.  . Tetanus Toxoids Anaphylaxis, Shortness Of Breath and Swelling  . Sulfa Antibiotics Nausea And Vomiting and Swelling    OBJECTIVE: Blood pressure 129/60, pulse (!) 42, temperature 98.2 F (36.8  C), temperature source Oral, resp. rate 18, height 6' (1.829 m), weight (!) 341 lb 11.4 oz (155 kg), SpO2 95 %.  Physical Exam  Constitutional: He is oriented to person, place, and time. He appears well-developed and well-nourished.  Resting comfortably in the bed.   HENT:  Mouth/Throat: No oropharyngeal exudate.  Eyes: Pupils are equal, round, and reactive to light. No scleral icterus.  Cardiovascular: Normal rate, regular rhythm and normal heart sounds.  No murmur heard. Pulmonary/Chest: Effort normal and breath sounds normal.  Abdominal: Soft.  Musculoskeletal: Normal range of motion.  Neurological: He is alert and oriented to person, place, and time. No cranial nerve deficit.  Skin: Skin is warm and dry. Capillary refill takes less than 2 seconds. Rash noted.  Psychiatric: He has a normal mood and affect. Judgment normal.    Lab Results Lab Results  Component Value Date   WBC 8.7 10/09/2017   HGB 14.9  10/09/2017   HCT 46.4 10/09/2017   MCV 90.1 10/09/2017   PLT 195 10/09/2017    Lab Results  Component Value Date   CREATININE 1.57 (H) 10/09/2017   BUN 20 10/09/2017   NA 136 10/09/2017   K 4.1 10/09/2017   CL 102 10/09/2017   CO2 24 10/09/2017    Lab Results  Component Value Date   ALT 16 10/08/2017   AST 19 10/08/2017   ALKPHOS 54 10/08/2017   BILITOT 0.6 10/08/2017     Microbiology: No results found for this or any previous visit (from the past 240 hour(s)).  Rexene Alberts, MSN, NP-C [facility] Cell: [phone] Pager: [phone]  10/09/2017 2:00 PM

## 2017-10-09 NOTE — Consult Note (Signed)
   Patient scheduled for I&D surgery tomorrow 11am with Dr. Ovid CurdMatthew Wagoner DPM. Preoperative orders placed.    Felecia ShellingBrent M. Evans, DPM [facility]  Dr. Felecia ShellingBrent M. Evans, DPM    [address]                                    [place], [place] [id number]                Office [phone]  Fax [phone]

## 2017-10-10 ENCOUNTER — Encounter (HOSPITAL_COMMUNITY)
Admission: EM | Disposition: A | Discharge status: Home / Self Care | Payer: Self-pay | Source: Ambulatory Visit | Attending: Family Medicine

## 2017-10-10 ENCOUNTER — Encounter (HOSPITAL_COMMUNITY): Payer: Self-pay | Admitting: Certified Registered Nurse Anesthetist

## 2017-10-10 ENCOUNTER — Inpatient Hospital Stay (HOSPITAL_COMMUNITY): Payer: Medicare HMO

## 2017-10-10 ENCOUNTER — Telehealth: Payer: Self-pay | Admitting: Infectious Diseases

## 2017-10-10 ENCOUNTER — Telehealth: Payer: Self-pay | Admitting: *Deleted

## 2017-10-10 DIAGNOSIS — L0390 Cellulitis, unspecified: Secondary | ICD-10-CM

## 2017-10-10 DIAGNOSIS — Z5181 Encounter for therapeutic drug level monitoring: Secondary | ICD-10-CM

## 2017-10-10 DIAGNOSIS — R21 Rash and other nonspecific skin eruption: Secondary | ICD-10-CM

## 2017-10-10 DIAGNOSIS — R197 Diarrhea, unspecified: Secondary | ICD-10-CM

## 2017-10-10 DIAGNOSIS — L03119 Cellulitis of unspecified part of limb: Secondary | ICD-10-CM

## 2017-10-10 LAB — LIPID PANEL
CHOL/HDL RATIO: 4 ratio
CHOLESTEROL: 166 mg/dL (ref 0–200)
HDL: 41 mg/dL (ref >40)
LDL Cholesterol: 107 mg/dL — ABNORMAL HIGH (ref 0–99)
Triglycerides: 90 mg/dL (ref <150)
VLDL: 18 mg/dL (ref 0–40)

## 2017-10-10 LAB — SURGICAL PCR SCREEN
MRSA, PCR: NEGATIVE
Staphylococcus aureus: NEGATIVE

## 2017-10-10 LAB — BASIC METABOLIC PANEL
Anion gap: 9 (ref 5–15)
BUN: 17 mg/dL (ref 8–23)
CO2: 21 mmol/L — ABNORMAL LOW (ref 22–32)
Calcium: 9.4 mg/dL (ref 8.9–10.3)
Chloride: 107 mmol/L (ref 98–111)
Creatinine, Ser: 1.35 mg/dL — ABNORMAL HIGH (ref 0.61–1.24)
GFR calc Af Amer: >60 mL/min (ref >60)
GFR, EST NON AFRICAN AMERICAN: 53 mL/min — AB (ref >60)
GLUCOSE: 140 mg/dL — AB (ref 70–99)
Potassium: 4.1 mmol/L (ref 3.5–5.1)
Sodium: 137 mmol/L (ref 135–145)

## 2017-10-10 LAB — C-REACTIVE PROTEIN

## 2017-10-10 LAB — GLUCOSE, CAPILLARY
GLUCOSE-CAPILLARY: 201 mg/dL — AB (ref 70–99)
Glucose-Capillary: 153 mg/dL — ABNORMAL HIGH (ref 70–99)
Glucose-Capillary: 139 mg/dL — ABNORMAL HIGH (ref 70–99)

## 2017-10-10 SURGERY — IRRIGATION AND DEBRIDEMENT FOOT
Anesthesia: General | Laterality: Left

## 2017-10-10 MED ORDER — PHENYLEPHRINE 40 MCG/ML (10ML) SYRINGE FOR IV PUSH (FOR BLOOD PRESSURE SUPPORT)
PREFILLED_SYRINGE | INTRAVENOUS | Status: AC
  Filled 2017-10-10: qty 10

## 2017-10-10 MED ORDER — PROPOFOL 10 MG/ML IV BOLUS
INTRAVENOUS | Status: AC
  Filled 2017-10-10: qty 20

## 2017-10-10 MED ORDER — MIDAZOLAM HCL 2 MG/2ML IJ SOLN
INTRAMUSCULAR | Status: AC
  Filled 2017-10-10: qty 2

## 2017-10-10 MED ORDER — LIDOCAINE 2% (20 MG/ML) 5 ML SYRINGE
INTRAMUSCULAR | Status: AC
  Filled 2017-10-10: qty 5

## 2017-10-10 MED ORDER — LINEZOLID 600 MG PO TABS: 600.0000 mg | ORAL_TABLET | Freq: Two times a day (BID) | ORAL | 0 refills | Status: AC

## 2017-10-10 MED ORDER — ROCURONIUM BROMIDE 10 MG/ML (PF) SYRINGE
PREFILLED_SYRINGE | INTRAVENOUS | Status: AC
  Filled 2017-10-10: qty 10

## 2017-10-10 MED ORDER — DEXAMETHASONE SODIUM PHOSPHATE 10 MG/ML IJ SOLN
INTRAMUSCULAR | Status: AC
  Filled 2017-10-10: qty 1

## 2017-10-10 MED ORDER — LINEZOLID 600 MG PO TABS: 600.0000 mg | ORAL_TABLET | Freq: Two times a day (BID) | ORAL | 0 refills | Status: DC

## 2017-10-10 MED ORDER — ONDANSETRON HCL 4 MG/2ML IJ SOLN
INTRAMUSCULAR | Status: AC
  Filled 2017-10-10: qty 2

## 2017-10-10 MED ORDER — LACTATED RINGERS IV SOLN: INTRAVENOUS | Status: DC

## 2017-10-10 MED ORDER — FENTANYL CITRATE (PF) 250 MCG/5ML IJ SOLN
INTRAMUSCULAR | Status: AC
Start: 2017-10-10 — End: ?
  Filled 2017-10-10: qty 5

## 2017-10-10 MED ORDER — SUCCINYLCHOLINE CHLORIDE 200 MG/10ML IV SOSY
PREFILLED_SYRINGE | INTRAVENOUS | Status: AC
  Filled 2017-10-10: qty 10

## 2017-10-10 MED ORDER — LIDOCAINE HCL 2 % IJ SOLN
INTRAMUSCULAR | Status: AC
  Filled 2017-10-10: qty 20

## 2017-10-10 MED ORDER — BUPIVACAINE HCL (PF) 0.5 % IJ SOLN
INTRAMUSCULAR | Status: AC
  Filled 2017-10-10: qty 30

## 2017-10-10 NOTE — Telephone Encounter (Signed)
Fax received. Linezolid approved by insurance 03/25/17 through 11/06/17. Reference number: [id number] Andree CossHowell, Neta Upadhyay M, RN

## 2017-10-10 NOTE — Discharge Summary (Signed)
Physician Discharge Summary  GER RINGENBERG RUE:[id number] DOB: 1950/04/02 DOA: 10/08/2017  PCP: Ralene Ok, MD  Admit date: 10/08/2017 Discharge date: 10/10/2017  Admitted From: Home Disposition: Home   Recommendations for Outpatient Follow-up:  1. Follow up with PCP in 1-2 weeks. Consider starting statin and alternative diabetes medication (farxiga is very expensive).  2. Needs regular CBC monitoring while on linezolid. 3. Follow up with ID as scheduled.  4. Follow up with podiatry ASAP.  Home Health: None Equipment/Devices: None Discharge Condition: Stable CODE STATUS: Full Diet recommendation: Heart healthy, carb-modified  Brief/Interim Summary: William Cameron is a 67 y.o. male with a history of T2DM, HTN, morbid obesity, OSA, and COPD who presented to the ED for a foot infection. He had surgery for hammer toes on the left foor 6/13 with percutaneous fixation pins ultimately removed 7/8 with course complicated by progressive erythema, edema, and recently purulence. He had been given doxycycline and ciprofloxacin post operatively and changed to levaquin on 7/8. Despite this and fastidious wound care by his wife the pain, redness and swelling worsened prompting him to visit the podiatrist 7/15 where he was referred to the ED for evaluation. He was afebrile, vital signs not consistent with sepsis. WBC 8.7 with normal differential. Lactic acid 2.2, improved to 1.7, Very broad IV antibiotic coverage with vancomycin, aztreonam, and ciprofloxacin was started and orthopedics consulted. MRI showed evidence of diffuse edema of the forefoot consistent with cellulitis and/or postop change, hardware failure and bone marrow edema felt to be due to instrumentation +/- osteomyelitis as well as a small 2nd MTP joint infusion without bone destruction. ID was consulted, narrowed antibiotics to vancomycin with continued improvement. Podiatry saw the patient while in the hospital and had planned a surgery,  though the patient was not made aware of this until the time of surgery and declined.   Discharge Diagnoses:  Principal Problem:   Cellulitis Active Problems:   Obesity with sleep apnea   Morbid obesity (HCC)   Essential hypertension   DM (diabetes mellitus), type 2, uncontrolled w/neurologic complication (HCC)   Elevated lactic acid level   Diabetic foot infection (HCC)   Stroke (HCC)  Left forefoot cellulitis: Complicating post-op recovery. Focal infection worsened despite po abx (though agents with poor coverage for typicals). No definite osteomyelitis on MRI (though difficult to tell with postoperative images), no fluid collection. Fortunately not septic and clinically improving on antibiotics here. HIV NR. - ID consulted, recommending vancomycin alone here, will get final dose tonight and discharge home. They've arranged for linezolid to be mailed to the patient's house and be available 7/18 for 28 day supply. ID f/u in clinic prior to that time.  - Follow up with podiatry ideally in 1-2 days for recheck wound. Recommended standard soap and water, keep wound covered.    s/p bunionectomy and hammertoe repair 2nd digit left foot 6/13: With evidence of hardware failure on MRI.  - Per podiatry, was planning surgery 7/17 though pt was not informed of this prior to the procedure and declined this. Will need close podiatry follow up.  T2DM: HbA1c 7.6% here indicating suboptimal control.  - Continue farxiga. Recommend PCP follow up for consideration of alternative agents which may cost less (pt pays $700 for this medication) - Dietitian and DM coordinator consulted  AFib: s/p DCCV by Dr. Jacinto Halim. Currently with controlled ventricular response.   - Continue metoprolol - On eliquis for CHA2DS2-VASc score of at least 5 (HTN, age[1], DM, CVA)  HTN:  -  Norvasc, benazepril, metoprolol  Obesity, OSA:  - Dietitian as above - CPAP qHS  History of CVA:  - Continue eliquis. LDL 107,  recommend starting statin as outpatient.   Discharge Instructions Discharge Instructions    Diet - low sodium heart healthy   Complete by:  As directed    Diet Carb Modified   Complete by:  As directed    Discharge instructions   Complete by:  As directed    You will be contacted tomorrow to schedule a follow up appointment with the podiatrist's office on Friday.  You will receive linezolid (the antibiotic) in the mail and will need to start taking this as directed. Your final dose of vancomycin will be tonight prior to discharge.  A follow up appointment was scheduled in the infectious disease clinic to check on your progress.  You should also follow up with your primary doctor to discuss alternative treatments for diabetes given the expense you've incurred with your current medications.  If you experience fever, chills, or worsening pain, redness, swelling, or discharge from the foot, seek medical attention right away.   Increase activity slowly   Complete by:  As directed      Allergies as of 10/10/2017      Reactions   Penicillins Anaphylaxis   Has patient had a PCN reaction causing immediate rash, facial/tongue/throat swelling, SOB or lightheadedness with hypotension: Yes Has patient had a PCN reaction causing severe rash involving mucus membranes or skin necrosis: Yes Has patient had a PCN reaction that required hospitalization Yes Has patient had a PCN reaction occurring within the last 10 years: No If all of the above answers are "NO", then may proceed with Cephalosporin use.   Tetanus Toxoids Anaphylaxis, Shortness Of Breath, Swelling   Sulfa Antibiotics Nausea And Vomiting, Swelling      Medication List    TAKE these medications   amLODipine 5 MG tablet Commonly known as:  NORVASC Take 5 mg by mouth daily.   benazepril 40 MG tablet Commonly known as:  LOTENSIN Take 20 mg by mouth every evening.   cholecalciferol 1000 units tablet Commonly known as:  VITAMIN D Take  4,000 Units by mouth daily.   ELIQUIS 5 MG Tabs tablet Generic drug:  apixaban Take 5 mg by mouth 2 (two) times daily.   FARXIGA 5 MG Tabs tablet Generic drug:  dapagliflozin propanediol Take 5 mg by mouth daily.   Fenofibric Acid 105 MG Tabs Take 1 tablet by mouth daily. Reported on 06/02/2015   ibuprofen 200 MG tablet Commonly known as:  ADVIL,MOTRIN Take 400 mg by mouth at bedtime.   linezolid 600 MG tablet Commonly known as:  ZYVOX Take 1 tablet (600 mg total) by mouth 2 (two) times daily for 28 days. Start taking on:  10/11/2017   metoprolol succinate 50 MG 24 hr tablet Commonly known as:  TOPROL-XL Take 50 mg by mouth daily. Take with or immediately following a meal.   traMADol 50 MG tablet Commonly known as:  ULTRAM Take one tablet every 8 hours prn foot pain. What changed:    how much to take  how to take this  when to take this  reasons to take this  additional instructions      Follow-up Information    [facility]. Go on 10/23/2017.   Specialty:  Infectious Diseases Why:  Tuesday July 30th @ 11:00 am with Judeth CornfieldStephanie, NP  Contact information: [address], [address]  [phone] mc [facility] [address] [phone]       Felecia Shelling, DPM. Schedule an appointment as soon as possible for a visit in 1 day(s).   Specialty:  Podiatry Contact information: [address] [place] [address] [place] [address] [phone]        Ralene Ok, MD. Schedule an appointment as soon as possible for a visit in 1 week(s).   Specialty:  Internal Medicine Contact information: [address] [place] [address] [phone]          Allergies  Allergen Reactions  . Penicillins Anaphylaxis    Has patient had a PCN reaction causing immediate rash, facial/tongue/throat swelling, SOB or lightheadedness with hypotension: Yes Has patient had a PCN reaction causing severe rash involving mucus membranes  or skin necrosis: Yes Has patient had a PCN reaction that required hospitalization Yes Has patient had a PCN reaction occurring within the last 10 years: No If all of the above answers are "NO", then may proceed with Cephalosporin use.  . Tetanus Toxoids Anaphylaxis, Shortness Of Breath and Swelling  . Sulfa Antibiotics Nausea And Vomiting and Swelling    Consultations:  Infectious disease   Orthopedics  Procedures/Studies: Mr Foot Left W Wo Contrast  Result Date: 10/09/2017 CLINICAL DATA:  Diabetic foot swelling.  Osteomyelitis suspected. EXAM: MRI OF THE LEFT FOREFOOT WITHOUT AND WITH CONTRAST TECHNIQUE: Multiplanar, multisequence MR imaging of the left forefoot was performed both before and after administration of intravenous contrast. CONTRAST:  20mL MULTIHANCE GADOBENATE DIMEGLUMINE 529 MG/ML IV SOLN COMPARISON:  Radiographs dating back to 09/12/2017 FINDINGS: Bones/Joint/Cartilage Navicular, cuneiform and cuboid: No marrow signal abnormality to suggest osteomyelitis. First ray: Extensive marrow edema with artifacts from pre-existing fixation screws noted medial to the first metatarsal Chevron procedure and not embedded within bone. Findings may represent hardware failure due to loosening and disengaged hardware. Stigmata of osteomyelitis and infection would be difficult to entirely exclude given the edema though the edema may simply reflect postop change. Second ray: Marrow edema of the second metatarsal head with small to moderate joint effusion of the first MTP joint. Reactive edema of the second digit with osteotomy defect involving the head of the second proximal phalanx. Similar to the first metatarsal, the marrow edema may simply reflect postoperative reactive edema from recent instrumentation. No frank bone destruction across the second MTP joint to suggest septic arthritis. Conclusive evidence for acute osteomyelitis. Third ray: Osteotomy defect involving the head of the third proximal  phalanx with reactive edema of the distal and middle phalanx and to a lesser degree the third metatarsal head and neck. Findings likely represent postop reactive edema due to recent surgery and instrumentation. No conclusive evidence for acute osteomyelitis. Fourth ray: Linear edema along the course of the previously existing K-wire fixation hardware. Osteotomy defect involving the fourth proximal phalangeal head without conclusive evidence for acute osteomyelitis. Fifth ray: No marrow signal abnormalities. No postoperative defects. Ligaments Noncontributory Muscles and Tendons No muscle atrophy. Mild intramuscular edema consistent with myositis of the flexor and extensor muscles crossing the forefoot. No tenosynovitis or tendinopathy. Soft tissues Diffuse soft tissue swelling of the forefoot consistent with cellulitis and postoperative edema. No abscess. No drainable fluid collections IMPRESSION: 1. Diffuse soft tissue swelling of the forefoot is noted consistent with cellulitis and/or postop change. 2. Apparent hardware failure with disengaged fixation screws at site of bunionectomy/chevron type procedure involving the distal first metatarsal shaft. Extensive marrow edema is seen some which is to be expected given recent surgery.  Slightly indistinct appearing cortices raise the possibility of evolving osteomyelitis however. 3. Lesser degrees of marrow edema involving the second through fourth rays as above likely secondary to instrumentation and post osteotomy change. No frank bone destruction is seen. 4. Small joint effusion involving the second metatarsal phalangeal joint is identified without frank bone destruction. Septic arthritis is believed less likely given lack of frank bone destruction. Attention on follow-up or sampling of fluid is suggested. Electronically Signed   By: Tollie Eth M.D.   On: 10/09/2017 03:16   Dg Foot Complete Left  Result Date: 10/08/2017 CLINICAL DATA:  67 year old male status  post surgery last month with postoperative infection. Progressive infectious symptoms with pain. EXAM: LEFT FOOT - COMPLETE 3+ VIEW COMPARISON:  Postoperative radiographs 09/24/2017 and 09/12/2017. FINDINGS: Skin staples have been removed. K-wires previously traversing the 2nd through 3rd rays have been removed. Osteotomies of the 2nd through 3rd proximal phalanges at the distal meta diaphyses are un-healed. Interval loosening of 2 cannulated screws from the 1st metatarsal with associated cortical osteolysis and unhealed osteotomy. Dystrophic calcifications in the adjacent soft tissues. Severe associated soft tissue swelling in the distal left foot with no soft tissue gas. No other fracture or osteolysis. Stable calcaneus. IMPRESSION: 1. Appearance highly suspicious for Osteomyelitis in the left 1st metatarsal, with associated hardware loosening and un-healed versus eroded osteotomy site. 2. Un-healed 2nd through 3rd proximal phalanx osteotomies, but with no associated bone erosion. 3. Moderate to severe soft tissue swelling with no soft tissue gas. Electronically Signed   By: Odessa Fleming M.D.   On: 10/08/2017 14:21   Dg Foot Complete Left  Result Date: 10/01/2017 Please see detailed radiograph report in office note.  Dg Foot Complete Left  Result Date: 09/24/2017 Please see detailed radiograph report in office note.  Dg Foot Complete Left  Result Date: 09/12/2017 Please see detailed radiograph report in office note.   Subjective: Pt confused about plan for surgery this morning. Declined this. No fevers. Pain is controlled, swelling continues to improve.   Discharge Exam: Vitals:   10/09/17 2127 10/10/17 0501  BP: 102/69 115/62  Pulse: (!) 54 (!) 115  Resp: (!) 21 20  Temp: 98.4 F (36.9 C) (!) 97.5 F (36.4 C)  SpO2: 97% 99%   General: Pt is alert, awake, not in acute distress Cardiovascular: RRR, S1/S2 +, no rubs, no gallops Respiratory: CTA bilaterally, no wheezing, no rhonchi Abdominal:  Soft, NT, ND, bowel sounds + Extremities: Left foot with diffuse edema and erythema within demarcations on dorsum. 2 wounds with serous discharge, no purulence expressible. +DP pulses bilaterally, strength and sensation intact at toes.   Labs: Basic Metabolic Panel: Recent Labs  Lab 10/08/17 1156 10/09/17 0240 10/10/17 0451  NA 136 136 137  K 4.9 4.1 4.1  CL 101 102 107  CO2 25 24 21*  GLUCOSE 170* 147* 140*  BUN 20 20 17   CREATININE 1.55* 1.57* 1.35*  CALCIUM 10.0 9.4 9.4   Liver Function Tests: Recent Labs  Lab 10/08/17 1156  AST 19  ALT 16  ALKPHOS 54  BILITOT 0.6  PROT 8.1  ALBUMIN 3.9   No results for input(s): LIPASE, AMYLASE in the last 168 hours. No results for input(s): AMMONIA in the last 168 hours. CBC: Recent Labs  Lab 10/08/17 1156 10/09/17 0240  WBC 7.8 8.7  NEUTROABS 5.4  --   HGB 16.6 14.9  HCT 52.5* 46.4  MCV 91.8 90.1  PLT 212 195   Cardiac Enzymes: No results  for input(s): CKTOTAL, CKMB, CKMBINDEX, TROPONINI in the last 168 hours. BNP: Invalid input(s): POCBNP CBG: Recent Labs  Lab 10/09/17 1204 10/09/17 1731 10/09/17 2102 10/10/17 0800 10/10/17 1159  GLUCAP 157* 159* 204* 153* 201*   D-Dimer No results for input(s): DDIMER in the last 72 hours. Hgb A1c Recent Labs    10/08/17 1156  HGBA1C 7.6*   Lipid Profile Recent Labs    10/10/17 0451  CHOL 166  HDL 41  LDLCALC 107*  TRIG 90  CHOLHDL 4.0   Thyroid function studies No results for input(s): TSH, T4TOTAL, T3FREE, THYROIDAB in the last 72 hours.  Invalid input(s): FREET3 Anemia work up No results for input(s): VITAMINB12, FOLATE, FERRITIN, TIBC, IRON, RETICCTPCT in the last 72 hours. Urinalysis    Component Value Date/Time   COLORURINE YELLOW 10/08/2017 1420   APPEARANCEUR CLEAR 10/08/2017 1420   LABSPEC 1.027 10/08/2017 1420   PHURINE 5.0 10/08/2017 1420   GLUCOSEU >=500 (A) 10/08/2017 1420   HGBUR NEGATIVE 10/08/2017 1420   BILIRUBINUR NEGATIVE 10/08/2017  1420   KETONESUR NEGATIVE 10/08/2017 1420   PROTEINUR NEGATIVE 10/08/2017 1420   UROBILINOGEN 0.2 07/08/2014 0845   NITRITE NEGATIVE 10/08/2017 1420   LEUKOCYTESUR NEGATIVE 10/08/2017 1420    Microbiology Recent Results (from the past 240 hour(s))  Surgical pcr screen     Status: None   Collection Time: 10/10/17  8:59 AM  Result Value Ref Range Status   MRSA, PCR NEGATIVE NEGATIVE Final   Staphylococcus aureus NEGATIVE NEGATIVE Final    Comment: (NOTE) The Xpert SA Assay (FDA approved for NASAL specimens in patients 59 years of age and older), is one component of a comprehensive surveillance program. It is not intended to diagnose infection nor to guide or monitor treatment. Performed at [facility], [address] [place], [place] [address]     Time coordinating discharge: Approximately 40 minutes  Tyrone Nine, MD  [facility] 10/10/2017, 1:27 PM Pager [phone]

## 2017-10-10 NOTE — Progress Notes (Addendum)
[facility]  Date of Admission:  10/08/2017   Total days of antibiotics 3        Day 3 Vancomycin          Patient ID: William Cameron is a 67 y.o. male with  Principal Problem:   Cellulitis Active Problems:   DM (diabetes mellitus), type 2, uncontrolled w/neurologic complication (HCC)   Obesity with sleep apnea   Morbid obesity (HCC)   Essential hypertension   Elevated lactic acid level   Diabetic foot infection (HCC)   Stroke (HCC)   . amLODipine  5 mg Oral Daily  . benazepril  20 mg Oral QPM  . cholecalciferol  2,000 Units Oral Daily  . fenofibrate  54 mg Oral Daily  . insulin aspart  0-5 Units Subcutaneous QHS  . insulin aspart  0-9 Units Subcutaneous TID WC  . metoprolol succinate  50 mg Oral Daily  . nutrition supplement (JUVEN)  1 packet Oral BID BM    SUBJECTIVE: William Cameron tells me is continues to feel better now on day 3 of his therapy now. He has a lot of questions about wound care and proper things to do at home to keep this going in the right direction. He is upset at surgery mix up from today. He apparently had one episode of upset stomach and diarrhea this morning he attributes to stress related to this. He has not had another stool to provide sample.   Allergies  Allergen Reactions  . Penicillins Anaphylaxis    Has patient had a PCN reaction causing immediate rash, facial/tongue/throat swelling, SOB or lightheadedness with hypotension: Yes Has patient had a PCN reaction causing severe rash involving mucus membranes or skin necrosis: Yes Has patient had a PCN reaction that required hospitalization Yes Has patient had a PCN reaction occurring within the last 10 years: No If all of the above answers are "NO", then may proceed with Cephalosporin use.  . Tetanus Toxoids Anaphylaxis, Shortness Of Breath and Swelling  . Sulfa Antibiotics Nausea And Vomiting and Swelling    OBJECTIVE: Vitals:   10/09/17 1252 10/09/17 2127  10/10/17 0501 10/10/17 0944  BP: 129/60 102/69 115/62   Pulse: (!) 42 (!) 54 (!) 115   Resp: 18 (!) 21 20   Temp:  98.4 F (36.9 C) (!) 97.5 F (36.4 C)   TempSrc:  Oral Oral   SpO2: 95% 97% 99%   Weight:    (!) 341 lb 11.4 oz (155 kg)  Height:    6' (1.829 m)   Body mass index is 46.34 kg/m.  Physical Exam  Constitutional: He appears well-developed and well-nourished.  HENT:  Mouth/Throat: Oropharynx is clear and moist.  Vitals reviewed.   Lab Results Lab Results  Component Value Date   WBC 8.7 10/09/2017   HGB 14.9 10/09/2017   HCT 46.4 10/09/2017   MCV 90.1 10/09/2017   PLT 195 10/09/2017    Lab Results  Component Value Date   CREATININE 1.35 (H) 10/10/2017   BUN 17 10/10/2017   NA 137 10/10/2017   K 4.1 10/10/2017   CL 107 10/10/2017   CO2 21 (L) 10/10/2017    Lab Results  Component Value Date   ALT 16 10/08/2017   AST 19 10/08/2017   ALKPHOS 54 10/08/2017   BILITOT 0.6 10/08/2017     Microbiology: No results found for this or any previous visit (from the past 240 hour(s)).  ASSESSMENT & Plan:  1. Cellulitis, L Foot = continues to have improvement. Drainage is much less. Our ID pharmacist has gotten him approved for patient assistance through [employer] for 28d of zyvox. This will be mailed to his home. He has a follow up appointment with me in 2 weeks to assess if we can stop therapy at that point.   2. Rash to L Foot = new itchy with erythematous papular base. I suspect this is related to previous increased drainage vs healing of cellulitis itself. If drainage continues to improve I presume he is OK to keep foot open and clean with soap and water. I have recommended him to keep ongoing care with podiatrist but not certain he will do this.   3. Medication monitoring = will check CBC at outpatient visit if it is decided to continue with longer course of therapy with linezolid. Baseline 195K.   4. Diarrhea = one time this AM. With no abdominal pain and  inability to produce specimen will D/C enteric precautions and CDiff test. Will monitor at outpatient appt. Call precautions given.   Would presume OK from ID perspective to discharge home later this evening after he gets his Vancomycin dose now that we can ensure his access to outpatient medication.   Rexene AlbertsStephanie Dixon, MSN, NP-C [facility] Cell: [phone] Pager: [phone]  10/10/2017  12:23 PM

## 2017-10-10 NOTE — Progress Notes (Signed)
Per MD patient will be D/C after the next dose of Vancomycin.

## 2017-10-10 NOTE — Progress Notes (Signed)
Patient was discharged home by MD order; discharged instructions  review and give to patient with care notes; IV DIC; dressing on left foot dry, clean, intact; patient will be escorted to the car by nurse tech via wheelchair.

## 2017-10-10 NOTE — Anesthesia Preprocedure Evaluation (Deleted)
Anesthesia Evaluation  Patient identified by MRN, date of birth, ID band Patient awake    Reviewed: Allergy & Precautions, H&P , NPO status , Patient's Chart, lab work & pertinent test results  History of Anesthesia Complications (+) PONV and history of anesthetic complications  Airway Mallampati: II   Neck ROM: full    Dental   Pulmonary sleep apnea , COPD,    breath sounds clear to auscultation       Cardiovascular hypertension,  Rhythm:regular Rate:Normal     Neuro/Psych    GI/Hepatic   Endo/Other  diabetes, Type obesity  Renal/GU Renal InsufficiencyRenal disease     Musculoskeletal   Abdominal   Peds  Hematology   Anesthesia Other Findings   Reproductive/Obstetrics                             Anesthesia Physical Anesthesia Plan  ASA: III  Anesthesia Plan: General   Post-op Pain Management:    Induction: Intravenous  PONV Risk Score and Plan: 3 and Ondansetron, Dexamethasone, Midazolam and Treatment may vary due to age or medical condition  Airway Management Planned: LMA  Additional Equipment:   Intra-op Plan:   Post-operative Plan:   Informed Consent: I have reviewed the patients History and Physical, chart, labs and discussed the procedure including the risks, benefits and alternatives for the proposed anesthesia with the patient or authorized representative who has indicated his/her understanding and acceptance.     Plan Discussed with: CRNA, Anesthesiologist and Surgeon  Anesthesia Plan Comments:         Anesthesia Quick Evaluation

## 2017-10-10 NOTE — Telephone Encounter (Signed)
William Cameron - [facility] states has pt's wound culture dated 10/08/2017, but there is no Account information. I reviewed clinicals and pt had wound culture in office on 10/08/2017. I gave our [facility] Account: [id number].

## 2017-10-10 NOTE — Progress Notes (Signed)
VASCULAR LAB PRELIMINARY  ARTERIAL  ABI completed:    RIGHT    LEFT    PRESSURE WAVEFORM  PRESSURE WAVEFORM  BRACHIAL 119 triphasic BRACHIAL 118 triphasic  DP 155 triphasic DP 116 triphasic  AT   AT    PT 106 triphasic PT 109 triphasic  PER   PER    GREAT TOE 48 NA GREAT TOE 94 NA    RIGHT LEFT  ABI 1.30 0.97     William Cameron  Acelin Ferdig, RVT 10/10/2017, 2:30 PM

## 2017-10-10 NOTE — Progress Notes (Signed)
Subjective: [name] was admitted on Tuesday for an infection s/p bunion surgery with hammer toe repair with Dr. Logan BoresEvans who evaluated him in the office on Monday and was sent to the ER for evaluation. Due to infection Dr. Logan BoresEvans had discussed surgery including an I&D, removal of hardware and bone biopsy. However he was unavailable for the surgery therefore I was planning on doing the surgery today. Denies any systemic complaints such as fevers, chills, nausea, vomiting. No acute changes since last appointment, and no other complaints at this time.   Objective: AAO x3, NAD DP/PT pulses palpable bilaterally, CRT less than 3 seconds Compared to the pictures from the office there is much improved edema and erythema to the foot. There are 2 small superficial openings, one on the bunion site and one on the 2nd metatarsal. There is no pus identified today. There is mild to moderate swelling but no fluctuance or crepitance. No ascending cellulitis. No malodor.  No pain with calf compression, swelling, warmth, erythema  Assessment: Left foot cellulitis with improvement   Plan: -All treatment options discussed with the patient including all alternatives, risks, complications.  -I came to evaluate the patient in pre-op. The patient and his wife were very upset about having surgery and was upset about this not being communicated with them. I apologized for this and explained to them I was covering today and thought this had been communicated with them. Upon evaluation of the foot it does appear much improved compared to the pictures and overall he states it feels good and he is feeling much better today. He was told that there was not infection in the bone and given that the foot is looking better he does not want to proceed with surgery. We discussed pros and cons of surgery and after discussion he does not want to proceed with surgery. We will keep a close monitor on his foot. I gave them our information and if  there are any questions to please let us know. I will discuss with Dr. Logan BoresEvans.  -Continue antibiotics per ID -Remain in CAM boot and recommend NWB -We will continue to follow while in the hospital and upon discharge. Dr. Samuella CotaPrice will be by on Thursday to evaluate.  -Patient encouraged to call the office with any questions, concerns, change in symptoms.   Ovid CurdMatthew Wagoner, DPM O: [phone] C: [phone]

## 2017-10-11 LAB — WOUND CULTURE
GRAM STAIN:: NONE SEEN
MICRO NUMBER:: 90835135
SPECIMEN QUALITY: ADEQUATE

## 2017-10-12 ENCOUNTER — Ambulatory Visit (INDEPENDENT_AMBULATORY_CARE_PROVIDER_SITE_OTHER): Payer: Medicare HMO | Admitting: Podiatry

## 2017-10-12 ENCOUNTER — Encounter: Payer: Self-pay | Admitting: Podiatry

## 2017-10-12 DIAGNOSIS — Z9889 Other specified postprocedural states: Secondary | ICD-10-CM

## 2017-10-14 NOTE — Progress Notes (Signed)
  Subjective:  Patient ID: William Cameron, male    DOB: 10/18/1950,  MRN: [id number]  Chief Complaint  Patient presents with  . Foot Pain    Follow up cellulitis left foot   "Its actually better than it was"  . NOTE    Pic taken today   67 y.o. male returns for the above complaint.  States the foot is looking better than it was.  States his pain is decreasing.  Still having significant swelling.  Has been trying to elevate.  On Levaquin as prescribed by ID. frustrated at the events of the day of his planned surgery specifically that he was not told he would have surgery by the podiatry team.  Objective:  There were no vitals filed for this visit. General AA&O x3. Normal mood and affect.  Vascular Foot warm to touch.  Unable to palpate DP pulse due to edema.  Neurologic Epicritic sensation grossly intact.  Dermatologic Left foot with resolving cellulitis. Two small areas of dehiscence with fibrotic base. Serous drainage but no active purulence.  No fluctuance or crepitus.  No ascending cellulitis.  Foot without excessive warmth.  Edema noted.   Orthopedic: No pain to palpation left foot.   Assessment & Plan:  Patient was evaluated and treated and all questions answered.  Left foot cellulitis status post left foot surgery -Hospital records including MRI and lab work reviewed. -Foot appears to be clinically improving based upon previous clinical photos. Patient notes improvement as well. -Dressed with dry sterile dressing.  Attempted to advise patient to apply something to the wound other than a dry dressing however patient states that the infectious disease doctors told him just to apply a dry dressing and patient wishes to follow their instructions without change. -Continue antibiotics as determined by ID.  Currently on Levaquin. -We will hold off discussion of removal of hardware. -Patient was evaluated by Dr. Charlsie Merlesegal and myself.  Both of us apologized for the poor communication regarding  his surgery. -Continue follow-up with Dr. Logan BoresEvans next week

## 2017-10-17 ENCOUNTER — Ambulatory Visit (INDEPENDENT_AMBULATORY_CARE_PROVIDER_SITE_OTHER): Payer: Medicare HMO | Admitting: Podiatry

## 2017-10-17 DIAGNOSIS — L03116 Cellulitis of left lower limb: Secondary | ICD-10-CM

## 2017-10-17 DIAGNOSIS — M2042 Other hammer toe(s) (acquired), left foot: Secondary | ICD-10-CM

## 2017-10-17 DIAGNOSIS — Z9889 Other specified postprocedural states: Secondary | ICD-10-CM

## 2017-10-17 NOTE — Progress Notes (Signed)
   HPI: Patient presents today for follow-up treatment evaluation regarding bunion and hammertoe repair.  Date of surgery 09/06/2017.  Patient recently admitted to the hospital for postsurgical cellulitis.  Patient says that he is doing much better and the swelling and pain has decreased significantly since IV antibiotics.  He is currently on his Levaquin 750 mg daily as per infectious disease.  He is here for follow-up treatment evaluation  Past Medical History:  Diagnosis Date  . Bronchitis    chronic bronchitis  . COPD (chronic obstructive pulmonary disease) (HCC)   . Diabetes mellitus without complication (HCC)   . Elevated lactic acid level   . Hypertension   . PONV (postoperative nausea and vomiting)    post op nausea  . 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   . Sleep apnea    cpap at night     Physical Exam: General: The patient is alert and oriented x3 in no acute distress.  Dermatology: There is a small ulceration to the proximal incision site of the bunion measuring approximately 1 cm in length.  No malodor noted.  Serous drainage noted.  There is no exposed bone muscle tendon ligament or joint.  Vascular: Palpable pedal pulses bilaterally.  Edema and erythema have significantly improved since last visit.  Neurological: Epicritic and protective threshold grossly intact bilaterally.   Musculoskeletal Exam: Range of motion within normal limits to all pedal and ankle joints bilateral. Muscle strength 5/5 in all groups bilateral.   Assessment: 1.  Status post bunionectomy with second toe hammertoe repair left foot.  Date of surgery 09/06/2017  2.  Cellulitis left foot-Improved   Plan of Care:  1. Patient evaluated.  2.  Continue taking oral Levaquin daily as per infectious disease 3.  Continue dry sterile dressing to the ulceration site. 4.  Continue  weightbearing in a postoperative shoe. 5.  Return to clinic in 3 weeks  Patient is going on vacation for a few weeks    Felecia ShellingBrent M. Gadge Hermiz, DPM [facility]  Dr. Felecia ShellingBrent M. Lonetta Blassingame, DPM    [address]                                        [place], [place] [address]                Office [phone]  Fax [phone]

## 2017-10-22 NOTE — Progress Notes (Signed)
Patient: William Cameron  DOB: 1950-07-17 MRN: [id number] PCP: Ralene Ok, MD  Referring Provider: [facility] cellulitis  Podiatrist: Logan Bores, MD   Patient Active Problem List   Diagnosis Date Noted  . Cellulitis 10/09/2017    Priority: High  . DM (diabetes mellitus), type 2, uncontrolled w/neurologic complication (HCC) 10/05/2014    Priority: Medium  . Medication management 10/23/2017  . Diabetic foot infection (HCC) 10/08/2017  . Stroke (HCC)   . Insomnia with sleep apnea 01/24/2016  . Atrial fibrillation, new onset (HCC) 11/12/2015  . Essential hypertension 10/05/2014  . Morbid obesity (HCC) 07/15/2014  . 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 OSA on CPAP 10/21/2013  . Obesity with sleep apnea 10/21/2013  . Severe obesity (BMI >= 40) (HCC) 09/03/2013     Subjective:  William Cameron is a 67 y.o. male here today for hospital follow up on his left foot cellulitis.   Geofrey underwent recent surgery involving the left foot to repair hammer toe and bunionectomy on 6/13; experienced significant, erythema, swelling and drainage that required removal of pins 7/8 - some hardware is remaining. Was given doxycycline + ciprofloxacin with a change to levaquin on 7/8. He presented to the hospital 7/15 after he was seen for follow up with Dr. Logan Bores and recommended ER evaluation. He improved quickly with IV vancomycin; MRI with increased bone marrow edema concerning for osteomyelitis vs recent surgical manipulation. Considering his rapid improvement in appearance and pain we decided to place him on highly bioavailable agent linezolid BID to continue treatment to target superficial culture growing MSSA.   Since hospital discharge he has had follow up with his podiatry team. Has completed 2 weeks of PO linezolid and reports good improvement. No drainage and only one small scab remains on the top of his foot. He is wondering if he should start putting topical medication on his foot as suggested by Podiatry  team. Denies any fevers, chills, bleeding or bruising.   Review of Systems  Constitutional: Negative for chills, fever, malaise/fatigue and weight loss.  HENT: Negative for sore throat.   Respiratory: Negative for cough and sputum production.   Cardiovascular: Positive for leg swelling (Left lower foot ). Negative for chest pain.  Gastrointestinal: Negative for abdominal pain, diarrhea and vomiting.  Genitourinary: Negative for dysuria and flank pain.  Musculoskeletal: Negative for joint pain, myalgias and neck pain.  Skin: Negative for rash.  Neurological: Negative for dizziness, tingling and headaches.  Psychiatric/Behavioral: Negative for depression and substance abuse. The patient is not nervous/anxious and does not have insomnia.     Past Medical History:  Diagnosis Date  . Bronchitis    chronic bronchitis  . COPD (chronic obstructive pulmonary disease) (HCC)   . Diabetes mellitus without complication (HCC)   . Elevated lactic acid level   . Hypertension   . PONV (postoperative nausea and vomiting)    post op nausea  . 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   . Sleep apnea    cpap at night    Outpatient Medications Prior to Visit  Medication Sig Dispense Refill  . amLODipine (NORVASC) 5 MG tablet Take 5 mg by mouth daily.    Marland Kitchen apixaban (ELIQUIS) 5 MG TABS tablet Take 5 mg by mouth 2 (two) times daily.    . benazepril (LOTENSIN) 40 MG tablet Take 20 mg by mouth  every evening.     . cholecalciferol (VITAMIN D) 1000 units tablet Take 4,000 Units by mouth daily.     Marland Kitchen FARXIGA 5 MG TABS Take 5 mg by mouth daily.     . fenofibrate micronized (ANTARA) 130 MG capsule Take 130 mg by mouth daily.  3  . Fenofibric Acid 105 MG TABS Take 1 tablet by mouth daily. Reported on 06/02/2015  5  . ibuprofen (ADVIL,MOTRIN) 200 MG tablet Take 400 mg by mouth at bedtime.     Marland Kitchen  linezolid (ZYVOX) 600 MG tablet Take 1 tablet (600 mg total) by mouth 2 (two) times daily for 28 days. 56 tablet 0  . metoprolol succinate (TOPROL-XL) 50 MG 24 hr tablet Take 50 mg by mouth daily. Take with or immediately following a meal.    . traMADol (ULTRAM) 50 MG tablet Take one tablet every 8 hours prn foot pain. (Patient taking differently: Take 50 mg by mouth every 8 (eight) hours as needed for moderate pain. ) 30 tablet 0   No facility-administered medications prior to visit.      Allergies  Allergen Reactions  . Penicillins Anaphylaxis    Has patient had a PCN reaction causing immediate rash, facial/tongue/throat swelling, SOB or lightheadedness with hypotension: Yes Has patient had a PCN reaction causing severe rash involving mucus membranes or skin necrosis: Yes Has patient had a PCN reaction that required hospitalization Yes Has patient had a PCN reaction occurring within the last 10 years: No If all of the above answers are "NO", then may proceed with Cephalosporin use.  . Tetanus Toxoids Anaphylaxis, Shortness Of Breath and Swelling  . Sulfa Antibiotics Nausea And Vomiting and Swelling    Social History   Tobacco Use  . Smoking status: Never Smoker  . Smokeless tobacco: Never Used  Substance Use Topics  . Alcohol use: No  . Drug use: No    Family History  Problem Relation Age of Onset  . Diabetes type II Mother   . Prostate cancer Father 17    Objective:   Vitals:   10/23/17 1056  BP: 126/82  Pulse: (!) 53  Temp: 97.6 F (36.4 C)  Weight: (!) 353 lb 12.8 oz (160.5 kg)   Body mass index is 47.98 kg/m.  Physical Exam  Constitutional: He is oriented to person, place, and time. He appears well-developed and well-nourished.  Obese, pleasant man seated comfortably in chair. Appears to be in good health today. Wife accompanies him.   Eyes: Pupils are equal, round, and reactive to light. No scleral icterus.  Cardiovascular: Regular rhythm. Bradycardia  present.  Pulmonary/Chest: Effort normal. No respiratory distress.  Abdominal: Soft.  Musculoskeletal: He exhibits edema.       Feet:  Left mid/forefoot with some swelling and ruddy coloring. Brisk capillary refill, adequate pulses.   Lymphadenopathy:    He has no cervical adenopathy.  Neurological: He is alert and oriented to person, place, and time.  Skin: Skin is warm and dry. Capillary refill takes less than 2 seconds.    Lab Results: Lab Results  Component Value Date   WBC 8.7 10/09/2017   HGB 14.9 10/09/2017   HCT 46.4 10/09/2017   MCV 90.1 10/09/2017   PLT 195 10/09/2017    Lab Results  Component Value Date   CREATININE 1.35 (H) 10/10/2017   BUN 17 10/10/2017   NA 137 10/10/2017   K 4.1 10/10/2017   CL 107 10/10/2017   CO2 21 (L) 10/10/2017    Lab  Results  Component Value Date   ALT 16 10/08/2017   AST 19 10/08/2017   ALKPHOS 54 10/08/2017   BILITOT 0.6 10/08/2017     Assessment & Plan:   Problem List Items Addressed This Visit      Other   Medication management - Primary    No external signs of thrombocytopenia - will check cbc to assess for any myelosuppressive effects that we can see with prolonged use of linezolid therapy.       Relevant Orders   CBC   Cellulitis    Improving - his foot still has swelling of the mid/forefoot and some ruddiness to coloring. One small scabbed area without drainage. No pain with palpating foot. Will continue with 2 more weeks Linezolid considering retained hardware but overall his previous incisions and such are healing nicely.  He inquired about going to the beach - I told him he is to absolutely stay out of public water/ocean and to keep his feet out of the sand to avoid infection.  I will reach out to Dr. Logan BoresEvans about topical agent he recommended. I advised the patient that if there was local wound care recommendation made by their team I do think he should consider it. I also asked him to consider trying compression  stockings to help with some of the swelling he is experiencing - he will start using them.   Return in 4 weeks to check in off antibiotics.         Return in about 1 month (around 11/20/2017).  Rexene AlbertsStephanie Dixon, MSN, NP-C [facility] Pager: [phone] Office: [phone]  10/23/17  12:42 PM

## 2017-10-23 ENCOUNTER — Ambulatory Visit (INDEPENDENT_AMBULATORY_CARE_PROVIDER_SITE_OTHER): Payer: Medicare HMO | Admitting: Infectious Diseases

## 2017-10-23 ENCOUNTER — Encounter: Payer: Self-pay | Admitting: Infectious Diseases

## 2017-10-23 VITALS — BP 126/82 | HR 53 | Temp 97.6°F | Wt 353.8 lb

## 2017-10-23 DIAGNOSIS — L03119 Cellulitis of unspecified part of limb: Secondary | ICD-10-CM | POA: Diagnosis not present

## 2017-10-23 DIAGNOSIS — Z79899 Other long term (current) drug therapy: Secondary | ICD-10-CM | POA: Diagnosis not present

## 2017-10-23 LAB — CBC
HCT: 45.5 % (ref 38.5–50.0)
HEMOGLOBIN: 15.2 g/dL (ref 13.2–17.1)
MCH: 28.6 pg (ref 27.0–33.0)
MCHC: 33.4 g/dL (ref 32.0–36.0)
MCV: 85.7 fL (ref 80.0–100.0)
MPV: 11 fL (ref 7.5–12.5)
PLATELETS: 132 10*3/uL — AB (ref 140–400)
RBC: 5.31 10*6/uL (ref 4.20–5.80)
RDW: 12.4 % (ref 11.0–15.0)
WBC: 5.5 10*3/uL (ref 3.8–10.8)

## 2017-10-23 NOTE — Assessment & Plan Note (Signed)
Improving - his foot still has swelling of the mid/forefoot and some ruddiness to coloring. One small scabbed area without drainage. No pain with palpating foot. Will continue with 2 more weeks Linezolid considering retained hardware but overall his previous incisions and such are healing nicely.  He inquired about going to the beach - I told him he is to absolutely stay out of public water/ocean and to keep his feet out of the sand to avoid infection.  I will reach out to Dr. Logan BoresEvans about topical agent he recommended. I advised the patient that if there was local wound care recommendation made by their team I do think he should consider it. I also asked him to consider trying compression stockings to help with some of the swelling he is experiencing - he will start using them.   Return in 4 weeks to check in off antibiotics.

## 2017-10-23 NOTE — Patient Instructions (Signed)
Finish out your antibiotic to complete 4 weeks total.   Please schedule a visit in 4 weeks to see me again - if when you stop antibiotics you experience any worsening symptoms of infection that is worrisome for you please call and let us know.   Will check some blood work today to ensure your platelet counts are doing OK on the medication - will call if we need to change our plan.   No water at the beach! No pools or Jacuzzi's.

## 2017-10-23 NOTE — Assessment & Plan Note (Signed)
No external signs of thrombocytopenia - will check cbc to assess for any myelosuppressive effects that we can see with prolonged use of linezolid therapy.

## 2017-10-24 ENCOUNTER — Encounter: Payer: Self-pay | Admitting: Neurology

## 2017-10-24 ENCOUNTER — Ambulatory Visit: Payer: Medicare HMO | Admitting: Neurology

## 2017-10-24 VITALS — BP 126/78 | HR 59 | Ht 72.0 in | Wt 354.0 lb

## 2017-10-24 DIAGNOSIS — G4733 Obstructive sleep apnea (adult) (pediatric): Secondary | ICD-10-CM | POA: Diagnosis not present

## 2017-10-24 DIAGNOSIS — E669 Obesity, unspecified: Secondary | ICD-10-CM

## 2017-10-24 DIAGNOSIS — Z9989 Dependence on other enabling machines and devices: Secondary | ICD-10-CM

## 2017-10-24 NOTE — Progress Notes (Signed)
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   Mr. Cheree Ditto reports that he had trouble with poor wound healing, and infection in the left foot, he has been followed by the wound care center.  He had an infection , saw his podiatrist and started on antibiotic, he underwent let foot surgery, hoping his foot would became better, but he didn't . He has not lost his big toe - and he can walk and drive again.  He gained weight from inactivity. He is fatigued while carrying his infection, has poorly controlled DM and is SOB at times.  He remains compliant on CPAP-his Epworth sleepiness score is endorsed at 3 points his fatigue severity at 31 points which is not that high.  He has been highly compliant with CPAP 97% with an average use at time of 4 hours 22 minutes he continues to use an AutoSet between 8 and 14 cmH2O and an EPR level of 3 cmH2O and a residual AHI of 0.3.  He has no major air leaks, this is an excellent resolution of his obstructive sleep apnea and no adjustments need to be made.   I think that his reported fatigue is related to the underlying infection and metabolic consequences.  Interval history from 26 April 2017, I am seeing Mr. Toledo today for a yearly revisit.  He is officially retired meanwhile but still does see occasional side job driving.  He has been a compliant CPAP user 100% for the last 30 days, with an average of 4 hours and 50 minutes at night his machine is resMeds air sense 10, was with a pressure window between 8 and 14 cmH2O and 3 cm EPR.  He has a remarkable reduction in apnea, his residual apnea index is 0.1. He reports sleeping well with the 95th percentile pressure of 12.9 cm and moderate but mostly mild air leaks.  He is using a nasal pillow interface. Air Fit P 10  Which slips off  frequently- he would like to try a new ResMed. Halo nasal pillow. He is much less fatigued than before retirement and before CPAP, Epworth sleepiness score is endorsed at only one-point fatigue severity score much reduced 27 points.  He just developed nocturia while using CPAP for over 18 months.  This seems not to be apnea related but he wakes up more than 3 times at night and has an appointment made with a urologist.    MM Today 04/25/2016: Mr. Fosco is a 67 year old male with a history of obstructive sleep apnea on CPAP and stroke. He returns today for an evaluation. His CPAP download indicates that he uses machine nightly. Each night he used his machine greater than 4 hours. He is on a minimum pressure of 8 cmh20 and maximum pressure 14 cm water with EPR 3. His residual AHI is 0.2. The patient states that he has a hard time keeping the mask on all night. He states that he usually can only make it approximately 4 hours. The patient reports that he is claustrophobic so he struggles using the mask. 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 The patient reports that he had a successful cardioversion. He returns today for an evaluation.   HISTORY Rosezella Kronick  10/22/14 (CD): Shirlean MylarMichael E Nairn is a 67 y.o. caucasian , married , right handed male , who is seen here as a referral from Dr. 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   Patient Kahan underwent a sleep study on 09-07-13 based on his neck circumference of 20 inches and is BMI of 48.1 his blood pressure prestudy was normal. However the sleep study revealed an AHI of 49.9 RDI of 63.2 and the patient slept not even 1 minute at night on his back these apnea in the please were obtained by  the patient slept on his side and prone .  The lowest oxygen saturation was 80% was 55.9 minutes of desaturation time in total. Was critical hypoxemia. The patient also had some PACs and PVCs during his sleep study, while he was titrated to 11 cm water.  We [facility] a [facility] his [facility] , auto-titrated between 8 and 14 cm water: the patient's 95th percentile is 13 cm water his AHI is 1.0. He had 100% compliance his average user time was 4 hours and 44 minutes at night.  We obtained a second in office download dated 10-21-13, 97% compliance 4 hours and 44 minutes 95th percentile of pressure was 12.8 cm pressure. He has now been using CPAP over 30 days with high compliance and feels improvement. Epworth 1 .  DOT driver with severe OSA, and hypoxemia- Patient responded excellent to CPAP at 13 cm water, by 955 pressure of auto titration. ONO was not longer suggestive of hyoxemia while on CPAP.  Interval history from 7-20 8-16. Mr. Charyl DancerGant is here for her regular revisit he still is not exactly in love with the CPAP but he certainly has used the oxygen concentrator overnight he never had any need for the portable oxygen tanks to be used. He has since his last visit lost over the 5 pounds no longer short of breath had hip surgery and is able to exercise walk and work outside. His physical activity level is much increased. Questions if he still needs oxygen at home and it's possible that he does not. He is using his CPAP and he has 100% compliance over the last 30 days and 100% of these days over 4 hours of continued use the residual AHI is 0.5 which is excellent as an out set machine between 8 and 14 cm water with 3 cm EPR and the 91st percentile pressure used is 11.6. Repeat not to make any adjustments for the CPAP device he underwent a pulse oximetry in 2015 which documented hypoxemia. I would like to repeat repeat the finger clip at night on CPAP without oxygen and see if he still needs oxygen  after this massive weight loss. The obesity hypoventilation diagnosis would be possibly removed from his chart given that his BMI has shrunken. During a recent DOT related exam he was told that his hematocrit is very high. I am concerned that this may mean that he is low used to lower oxygen levels. However it is worth rechecking his nocturnal oxygenation    REVIEW OF SYSTEMS: Out of a complete 14 system review of symptoms, the patient complains only of the following symptoms, and all other reviewed systems are negative.  Epworth sleepiness score 1  ALLERGIES: Allergies  Allergen Reactions  . Penicillins Anaphylaxis    ephalosporin use.  . Tetanus Toxoids Swelling    Can't breathe  . Sulfa Antibiotics Swelling    Swelling and nausea    HOME MEDICATIONS: Outpatient Medications Prior to Visit  Medication Sig Dispense Refill  . amLODipine (NORVASC) 5 MG tablet Take 5 mg by mouth daily.    Marland Kitchen apixaban (ELIQUIS) 5 MG TABS tablet Take 5 mg by mouth 2 (two) times daily.    . benazepril (LOTENSIN) 40 MG tablet Take 20 mg by mouth every evening.     . cholecalciferol (VITAMIN D) 1000 units tablet Take 4,000 Units by mouth daily.     Marland Kitchen FARXIGA 5 MG TABS Take 5 mg by mouth daily.     . fenofibrate micronized (ANTARA) 130 MG capsule Take 130 mg by mouth daily.  3  . Fenofibric Acid 105 MG TABS Take 1 tablet by mouth daily. Reported on 06/02/2015  5  . ibuprofen (ADVIL,MOTRIN) 200 MG tablet Take 400 mg by mouth at bedtime.     Marland Kitchen linezolid (ZYVOX) 600 MG tablet Take 1 tablet (600 mg total) by mouth 2 (two) times daily for 28 days. 56 tablet 0  . metoprolol succinate (TOPROL-XL) 50 MG 24 hr tablet Take 50 mg by mouth daily. Take with or immediately following a meal.    . traMADol (ULTRAM) 50 MG tablet Take one tablet every 8 hours prn foot pain. (Patient taking differently: Take 50 mg by mouth every 8 (eight) hours as needed for moderate pain. ) 30 tablet 0   No facility-administered medications  prior to visit.     PAST MEDICAL HISTORY: Past Medical History:  Diagnosis Date  . Bronchitis    chronic bronchitis  . COPD (chronic obstructive pulmonary disease) (HCC)   . Diabetes mellitus without complication (HCC)   . Elevated lactic acid level   . Hypertension   . PONV (postoperative nausea and vomiting)    post op nausea  . 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   . Sleep apnea    cpap at night    PAST SURGICAL HISTORY: Past Surgical History:  Procedure Laterality Date  . CARDIOVERSION N/A 01/25/2016   Procedure: CARDIOVERSION;  Surgeon: Yates Decamp, MD;  Location: [facility] ENDOSCOPY;  Service: Cardiovascular;  Laterality: N/A;  . COLONOSCOPY  02/02/2012   Procedure: COLONOSCOPY;  Surgeon: Theda Belfast, MD;  Location: [facility] ENDOSCOPY;  Service: Endoscopy;  Laterality: N/A;  . HERNIA REPAIR    . HYDROCELE EXCISION / REPAIR    . JOINT REPLACEMENT     rt knee  . ROTATOR CUFF REPAIR  rt  . TOTAL HIP ARTHROPLASTY Right 07/14/2014   Procedure: RIGHT TOTAL HIP ARTHROPLASTY;  Surgeon: Durene Romans, MD;  Location: [facility] ORS;  Service: Orthopedics;  Laterality: Right;    FAMILY HISTORY: Family History  Problem Relation Age of Onset  . Diabetes type II Mother   . Prostate cancer Father 57    SOCIAL HISTORY: Social History   Socioeconomic History  . Marital status: Married    Spouse name: [name]  . Number of children: 1  . Years of education: 12+  . Highest education level: Not on file  Occupational History    Employer: [employer]  Social Needs  . Financial resource strain: Not on file  . Food insecurity:    Worry:  Not on file    Inability: Not on file  . Transportation needs:    Medical: Not on file    Non-medical: Not on file  Tobacco Use  . Smoking status: Never Smoker  . Smokeless tobacco: Never Used  Substance and Sexual Activity  . Alcohol  use: No  . Drug use: No  . Sexual activity: Never  Lifestyle  . Physical activity:    Days per week: Not on file    Minutes per session: Not on file  . Stress: Not on file  Relationships  . Social connections:    Talks on phone: Not on file    Gets together: Not on file    Attends religious service: Not on file    Active member of club or organization: Not on file    Attends meetings of clubs or organizations: Not on file    Relationship status: Not on file  . Intimate partner violence:    Fear of current or ex partner: Not on file    Emotionally abused: Not on file    Physically abused: Not on file    Forced sexual activity: Not on file  Other Topics Concern  . Not on file  Social History Narrative   Patient is married [name]) and lives at home with his wife.   Patient has one adult child.   Patient is working full-time.   Patient has a high school and auto classes.   Patient is right-handed.   Patient drinks two cups of coffee every morning and very little tea.      PHYSICAL EXAM  Vitals:   10/24/17 0922  BP: 126/78  Pulse: (!) 59  Weight: (!) 354 lb (160.6 kg)  Height: 6' (1.829 m)   Body mass index is 48.01 kg/m.  Generalized: Well developed, in no acute distress  Neck: Circumference 15/2 inches. Mallampati 4.   Facial hair.  Neurological examination  Mentation: Alert oriented to time, place, history taking. Follows all commands speech and language fluent Cranial nerve : Pupils are equal round reactive to light. No cataracts  . Extraocular movements were full, visual field were full on confrontational test. Facial sensation and strength were normal. Tongue midline.  Head turning and shoulder shrug  were normal and symmetric. Motor: normal symmetric motor tone is noted throughout.  Sensory:  Reduced  Vibration and soft touch in all toes, and bottom of his feet. No evidence of extinction is noted.  Coordination: deferred  Reflexes: Deep tendon reflexes are  symmetrically attenuated bilaterally.   DIAGNOSTIC DATA (LABS, IMAGING, TESTING) - I reviewed patient records, labs, notes, testing and imaging myself where available.  CPAP download reported and discussed.   Lab Results  Component Value Date   WBC 5.5 10/23/2017   HGB 15.2 10/23/2017   HCT 45.5 10/23/2017   MCV 85.7 10/23/2017   PLT 132 (L) 10/23/2017      Component Value Date/Time   NA 137 10/10/2017 0451   K 4.1 10/10/2017 0451   CL 107 10/10/2017 0451   CO2 21 (L) 10/10/2017 0451   GLUCOSE 140 (H) 10/10/2017 0451   BUN 17 10/10/2017 0451   CREATININE 1.35 (H) 10/10/2017 0451   CALCIUM 9.4 10/10/2017 0451   PROT 8.1 10/08/2017 1156   ALBUMIN 3.9 10/08/2017 1156   AST 19 10/08/2017 1156   ALT 16 10/08/2017 1156   ALKPHOS 54 10/08/2017 1156   BILITOT 0.6 10/08/2017 1156   GFRNONAA 53 (L) 10/10/2017 0451   GFRAA >60  10/10/2017 0451   Lab Results  Component Value Date   CHOL 166 10/10/2017   HDL 41 10/10/2017   LDLCALC 107 (H) 10/10/2017   TRIG 90 10/10/2017   CHOLHDL 4.0 10/10/2017   Lab Results  Component Value Date   HGBA1C 7.6 (H) 10/08/2017      ASSESSMENT AND PLAN 67 y.o. year old male  has a past medical history of Bronchitis, COPD (chronic obstructive pulmonary disease) (HCC), Diabetes mellitus without complication (HCC), Elevated lactic acid level, Hypertension, PONV (postoperative nausea and vomiting), Severe obesity (BMI >= 40) (HCC) (09/03/2013), and Sleep apnea. here with:  1. Obstructive sleep apnea on auto airsense 10 CPAP, highly compliant at 97%. Excellent resolution of apnea.  He is using a nasal pillow interface. Air Fit P 10  Which slips off frequently- he would like to try a new ResMed. Halo nasal pillow.   2. Main risk factor remains obesity- plus co-morbidities of COPD, DM, HTN . I recommended medical weight management with Dr Dalbert Garnet and to involve wife and son, to determine the menu. Wife is overweight also.   3. Diabetic neuropathy, not  gout. Weight loss may help his glucose control. Slow healing wound- but no loss of peripheral pulses.    I spent 30 minutes with the patient 50% of this time was spent reviewing risk factors for sleep apnea and risks associated with untreated sleep apnea.  His current subjective fatigue while sleeping well at night- has certainly to do with infection, and related metabolic consequences.  His infection is now controlled , no osteomyelitis reported.    [name], MD  Diplomate of ABSM, ABPN,  and  Fellow of the AASM and AAN.    10/24/2017, 9:52 AM [name] [address] [place], [place] [address] [phone]

## 2017-11-12 ENCOUNTER — Ambulatory Visit (INDEPENDENT_AMBULATORY_CARE_PROVIDER_SITE_OTHER): Payer: Medicare HMO | Admitting: Podiatry

## 2017-11-12 ENCOUNTER — Ambulatory Visit (INDEPENDENT_AMBULATORY_CARE_PROVIDER_SITE_OTHER): Payer: Medicare HMO

## 2017-11-12 DIAGNOSIS — Z9889 Other specified postprocedural states: Secondary | ICD-10-CM

## 2017-11-12 DIAGNOSIS — L03119 Cellulitis of unspecified part of limb: Principal | ICD-10-CM

## 2017-11-12 DIAGNOSIS — L02619 Cutaneous abscess of unspecified foot: Secondary | ICD-10-CM

## 2017-11-12 DIAGNOSIS — L02612 Cutaneous abscess of left foot: Secondary | ICD-10-CM | POA: Diagnosis not present

## 2017-11-12 NOTE — Progress Notes (Signed)
   HPI: Patient presents today for follow-up treatment evaluation regarding bunion and hammertoe repair.  Date of surgery 09/06/2017.  Patient recently finished his oral antibiotics as per infectious disease.  He is concerned with continued amount of swelling to the left foot.  He continues to be weightbearing in a postoperative surgical shoe.  He presents for follow-up treatment evaluation  Past Medical History:  Diagnosis Date  . Bronchitis    chronic bronchitis  . COPD (chronic obstructive pulmonary disease) (HCC)   . Diabetes mellitus without complication (HCC)   . Elevated lactic acid level   . Hypertension   . PONV (postoperative nausea and vomiting)    post op nausea  . 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   . Sleep apnea    cpap at night     Physical Exam: General: The patient is alert and oriented x3 in no acute distress.  Dermatology: All the incision sites to the surgical extremity have healed.  Complete reepithelialization has occurred..  No malodor noted.  Negative for drainage.   Vascular: Palpable pedal pulses bilaterally.  Erythema resolved.  Chronic edema appears consistent throughout the surgical forefoot  Neurological: Epicritic and protective threshold grossly intact bilaterally.   Musculoskeletal Exam: Range of motion within normal limits to all pedal and ankle joints bilateral. Muscle strength 5/5 in all groups bilateral.   Radiographic exam: X-rays today do show some degenerative changes to the first metatarsal.  As compared to x-rays on 09/24/2017 there is some movement of the osteotomy site with fragmentation.  Unlikely osteomyelitis due to resolved cellulitis.  Callus formation noted.  Assessment: 1.  Status post bunionectomy with second toe hammertoe repair left foot.  Date of surgery 09/06/2017  2.  Cellulitis left foot- resolved 3.   Chronic edema left foot   Plan of Care:  1. Patient evaluated.  X-rays were reviewed today with the patient comparing prior x-rays to today.  The first metatarsal is still in somewhat adequate alignment. 2.  Compression anklets were dispensed today.  Recommend that he wears compression daily. 3.  Patient has completed oral antibiotics as per Rexene AlbertsStephanie Dixon, infectious disease. 4.  Continue weightbearing in a postoperative shoe x4 weeks    Felecia ShellingBrent M. Elice Crigger, DPM [facility]  Dr. Felecia ShellingBrent M. Keanna Tugwell, DPM    [address]                                        [place], [place] [id number]                Office [phone]  Fax [phone]) [phone]

## 2017-11-19 ENCOUNTER — Telehealth: Payer: Self-pay

## 2017-11-19 NOTE — Telephone Encounter (Signed)
Returned call to [name] about his left foot. His left foot has continued to have swelling despite compression stockings. Has been to see Dr. Logan BoresEvans 8-19 and seemed that the cellulitis he previously experienced had resolved at that visit and XRay was improved a bit. He got up Saturday morning and was as "red as a fire truck". He went into the tub and after he bathed he had a "large marble" he noticed on the long incision near the big toe. A family member popped the area and expressed a moderate amount of pus and what seemed to be old blood. He does not recall any trauma to the site and tells me it was superficial from what he can tell. He started taking left over levofloxacin once a day but increased it to twice a day on Sunday. Pain, redness and swelling has improved with the antibiotics. He continues to wear compression stockings on the leg and has all morning today and really trying to keep his foot elevated. He has no fevers/chills/nigt sweats presently.   I explained to [name] that it sounds as if he had an abscess that erupted fairly quickly with some surrounding cellulitis at his previous surgical site. 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 Would not rely on levaquin to treat MRSA as it can induce resistance quickly; however it sounds like he has improved on it -- I asked him to reduce the levaquin to once a day and stop taking this after Wednesday to complete 5 days since abscess has been drained from what he tells me.  I also discussed with him that we would need to reconsider removal of hardware. Will forward to Dr. Logan BoresEvans to inform him as I do not believe [name] has called to let him know yet. He is open to hardware removal if this would give a higher likelihood for a cure, which I think would considering staph involvement. Will see what his foot looks like on Friday. Likely needs repeat MRI  to ensure no deeper abscess.

## 2017-11-19 NOTE — Telephone Encounter (Signed)
Patient calling regarding raised area on foot after shower which was "popped with a sterile sewing needle" with explosive drainage on Friday. . The same thing happened on Saturday .  Started an antibiotic prescribed by Dr Logan BoresEvans to take daily but  decided he needed it twice daily and now only has one pill left.  Patient feels his symptoms worsened after procedure with Dr Logan BoresEvans last week.    His foot continues to be swollen , pain present, no indication of fever (pt does not have a thermometer) , he is able to move toes and flex his ankle.  Patient has appointment on Friday this week with Judeth CornfieldStephanie and has concerns he may need earlier appointment.   Please advise.

## 2017-11-20 NOTE — Telephone Encounter (Addendum)
I explained to pt Durwin Noraixon, NP had sent a message and Dr. Logan BoresEvans had wanted to see him as soon as possible.Pt states he started taking a medication Dr. Logan BoresEvans prescribed, and it is a little better. Pt states he is to see the nurse on Friday. I told pt Dr. Logan BoresEvans wanted to see him to evaluated the foot and for treatment. Pt states the antibiotic he was on did not help and the place Dr. Logan BoresEvans sent him said he might need IV. I told him that was a possibility, and we needed to get him evaluated to see if that is what is needed. Transferred pt to Scheduler and pt is scheduled at 1:15pm.

## 2017-11-20 NOTE — Telephone Encounter (Signed)
Unable to leave a message pt's voicemail box is not set up. 

## 2017-11-21 ENCOUNTER — Other Ambulatory Visit: Payer: Self-pay | Admitting: Podiatry

## 2017-11-21 ENCOUNTER — Ambulatory Visit: Payer: Self-pay

## 2017-11-21 ENCOUNTER — Ambulatory Visit (INDEPENDENT_AMBULATORY_CARE_PROVIDER_SITE_OTHER): Payer: Medicare HMO | Admitting: Podiatry

## 2017-11-21 DIAGNOSIS — L03119 Cellulitis of unspecified part of limb: Secondary | ICD-10-CM

## 2017-11-21 DIAGNOSIS — L02619 Cutaneous abscess of unspecified foot: Secondary | ICD-10-CM

## 2017-11-21 DIAGNOSIS — Z9889 Other specified postprocedural states: Secondary | ICD-10-CM

## 2017-11-21 MED ORDER — LEVOFLOXACIN 750 MG PO TABS: 750.0000 mg | ORAL_TABLET | Freq: Every day | ORAL | 0 refills | Status: DC

## 2017-11-21 NOTE — Telephone Encounter (Signed)
Thank you for arranging follow up.

## 2017-11-21 NOTE — Patient Instructions (Signed)
Pre-Operative Instructions  Congratulations, you have decided to take an important step towards improving your quality of life.  You can be assured that the doctors and staff at [facility] will be with you every step of the way.  Here are some important things you should know:  1. Plan to be at the surgery center/hospital at least 1 (one) hour prior to your scheduled time, unless otherwise directed by the surgical center/hospital staff.  You must have a responsible adult accompany you, remain during the surgery and drive you home.  Make sure you have directions to the surgical center/hospital to ensure you arrive on time. 2. If you are having surgery at [facility] or [facility], you will need a copy of your medical history and physical form from your family physician within one month prior to the date of surgery. We will give you a form for your primary physician to complete.  3. We make every effort to accommodate the date you request for surgery.  However, there are times where surgery dates or times have to be moved.  We will contact you as soon as possible if a change in schedule is required.   4. No aspirin/ibuprofen for one week before surgery.  If you are on aspirin, any non-steroidal anti-inflammatory medications (Mobic, Aleve, Ibuprofen) should not be taken seven (7) days prior to your surgery.  You make take Tylenol for pain prior to surgery.  5. Medications - If you are taking daily heart and blood pressure medications, seizure, reflux, allergy, asthma, anxiety, pain or diabetes medications, make sure you notify the surgery center/hospital before the day of surgery so they can tell you which medications you should take or avoid the day of surgery. 6. No food or drink after midnight the night before surgery unless directed otherwise by surgical center/hospital staff. 7. No alcoholic beverages 24-hours prior to surgery.  No smoking 24-hours prior or 24-hours after  surgery. 8. Wear loose pants or shorts. They should be loose enough to fit over bandages, boots, and casts. 9. Don't wear slip-on shoes. Sneakers are preferred. 10. Bring your boot with you to the surgery center/hospital.  Also bring crutches or a walker if your physician has prescribed it for you.  If you do not have this equipment, it will be provided for you after surgery. 11. If you have not been contacted by the surgery center/hospital by the day before your surgery, call to confirm the date and time of your surgery. 12. Leave-time from work may vary depending on the type of surgery you have.  Appropriate arrangements should be made prior to surgery with your employer. 13. Prescriptions will be provided immediately following surgery by your doctor.  Fill these as soon as possible after surgery and take the medication as directed. Pain medications will not be refilled on weekends and must be approved by the doctor. 14. Remove nail polish on the operative foot and avoid getting pedicures prior to surgery. 15. Wash the night before surgery.  The night before surgery wash the foot and leg well with water and the antibacterial soap provided. Be sure to pay special attention to beneath the toenails and in between the toes.  Wash for at least three (3) minutes. Rinse thoroughly with water and dry well with a towel.  Perform this wash unless told not to do so by your physician.  Enclosed: 1 Ice pack (please put in freezer the night before surgery)   1 Hibiclens skin cleaner     Pre-op instructions  If you have any questions regarding the instructions, please do not hesitate to call our office.  [place]: [address], [place], [place] [address] -- [phone]  [place]: [address] [place], [place] [address] -- [phone]  [place]: [address]  [place], [place] [address] -- [phone]  [place]: [address], [address], [place], [place] [address] -- [phone]  Website:  [email or web address] 

## 2017-11-22 ENCOUNTER — Other Ambulatory Visit: Payer: Self-pay | Admitting: Neurology

## 2017-11-22 ENCOUNTER — Telehealth: Payer: Self-pay | Admitting: *Deleted

## 2017-11-22 DIAGNOSIS — Z9989 Dependence on other enabling machines and devices: Principal | ICD-10-CM

## 2017-11-22 DIAGNOSIS — G4733 Obstructive sleep apnea (adult) (pediatric): Secondary | ICD-10-CM

## 2017-11-22 NOTE — Progress Notes (Signed)
HPI: Patient presents today for follow-up treatment evaluation regarding bunion and hammertoe repair.  Date of surgery 09/06/2017.  Patient was last seen on 11/12/2017 at which time he was doing very well.  Infectious disease had discontinued his oral antibiotics.  There was still some concern for edema of the foot, however there was no significant warmth associated to the edema.  Patient states that after his last visit approximately 1 week ago he noticed an abscess to the very proximal incision site of the left foot.  He states that his son sterilized a needle and ruptured the abscess which was approximately the size of his thumb.  His son expressed the large amount of purulent and hemorrhagic fluid and debris.  The abscess lesion has been draining ever since.  He says that the redness and swelling has decreased significantly.  He began taking some leftover oral Levaquin 750 mg 2 times daily after draining the abscess.  Our office contacted him to come immediately to the office for further treatment and evaluation.  Patient presents today.  Past Medical History:  Diagnosis Date  . Bronchitis    chronic bronchitis  . COPD (chronic obstructive pulmonary disease) (HCC)   . Diabetes mellitus without complication (HCC)   . Elevated lactic acid level   . Hypertension   . PONV (postoperative nausea and vomiting)    post op nausea  . 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   . Sleep apnea    cpap at night      Physical Exam: General: The patient is alert and oriented x3 in no acute distress.  Dermatology: All the incision sites to the surgical extremity have healed with exception of the very proximal portion of the medial incision site where the abscess developed.  Abscess appears to be approximately 0.8 cm in diameter.  There is some localized erythema and edema also noted  surrounding the lesion.  Localized warmth noted to the surgical forefoot.  Complete reepithelialization has occurred to all other incision sites.  No malodor noted.  There does appear to be some serous drainage noted from the abscess lesion.  Please see picture attached  Vascular: Palpable pedal pulses bilaterally.  Erythema resolved.  Chronic edema appears consistent throughout the surgical forefoot  Neurological: Epicritic and protective threshold grossly intact bilaterally.   Musculoskeletal Exam: Status post left forefoot reconstructive surgery  Assessment: 1.  Status post bunionectomy with second toe hammertoe repair left foot.  Date of surgery 09/06/2017  2.  Recurrent cellulitis left foot 3.  Chronic edema left foot 4.  Possible osteomyelitis left foot  Plan of Care:  1. Patient evaluated.   2.  Today new cultures were taken of the abscess lesion and sent to pathology for culture and sensitivity 3.  Refill prescription for Levaquin 750 mg take daily. 4.  The patient has an appointment with infectious disease, Rexene AlbertsStephanie Dixon, on 11/23/2017.  It appears that the patient has failed all oral antibiotic therapy.  Patient would likely benefit from long-term IV antibiotics. 5.  Today I discussed with the patient that he would likely benefit from an incision and drainage of the abscess lesion site as well as removal of the orthopedic screws and possible bone biopsy.  Also possible placement of antibiotic beads.  This would be in the patient's best interest to resolve the recurrent/resistant infection.  The patient agrees.  6.  Authorization for surgery was initiated today.  Surgery will consist of incision and drainage left foot.  Removal of orthopedic screws left foot.  Bone biopsy left foot first metatarsal.  Possible placement of antibiotic beads left foot. 7.  We will arrange surgery as soon as possible.  Return to clinic 1 week postop    Felecia Shelling, DPM [facility]  Dr. Felecia Shelling, DPM    [address] [place], [place] [address]                Office [phone]  Fax [phone]

## 2017-11-22 NOTE — Telephone Encounter (Signed)
I am calling to see if you can do surgery on Wednesday, December 05, 2017, at [facility].  "Yes, that date will be fine.  What time?"  Hopefully your surgery will start at 12:15 pm.  Someone from the surgical center will call you and give you the arrival time a couple of days prior to your surgery date.

## 2017-11-23 ENCOUNTER — Other Ambulatory Visit: Payer: Self-pay

## 2017-11-23 ENCOUNTER — Telehealth: Payer: Self-pay | Admitting: Podiatry

## 2017-11-23 ENCOUNTER — Ambulatory Visit (INDEPENDENT_AMBULATORY_CARE_PROVIDER_SITE_OTHER): Payer: Medicare HMO | Admitting: Infectious Diseases

## 2017-11-23 ENCOUNTER — Inpatient Hospital Stay (HOSPITAL_COMMUNITY)
Admission: AD | Admit: 2017-11-23 | Discharge: 2017-11-27 | DRG: 857 | Disposition: A | Discharge status: Home / Self Care | Payer: Medicare HMO | Source: Ambulatory Visit | Attending: Internal Medicine | Admitting: Internal Medicine

## 2017-11-23 ENCOUNTER — Encounter (HOSPITAL_COMMUNITY): Payer: Self-pay | Admitting: *Deleted

## 2017-11-23 ENCOUNTER — Inpatient Hospital Stay: Payer: Self-pay

## 2017-11-23 ENCOUNTER — Telehealth: Payer: Self-pay

## 2017-11-23 VITALS — BP 115/73 | HR 60 | Temp 97.8°F | Wt 336.0 lb

## 2017-11-23 DIAGNOSIS — I129 Hypertensive chronic kidney disease with stage 1 through stage 4 chronic kidney disease, or unspecified chronic kidney disease: Secondary | ICD-10-CM | POA: Diagnosis present

## 2017-11-23 DIAGNOSIS — E1149 Type 2 diabetes mellitus with other diabetic neurological complication: Secondary | ICD-10-CM | POA: Diagnosis present

## 2017-11-23 DIAGNOSIS — Y838 Other surgical procedures as the cause of abnormal reaction of the patient, or of later complication, without mention of misadventure at the time of the procedure: Secondary | ICD-10-CM | POA: Diagnosis present

## 2017-11-23 DIAGNOSIS — Z833 Family history of diabetes mellitus: Secondary | ICD-10-CM | POA: Diagnosis not present

## 2017-11-23 DIAGNOSIS — J449 Chronic obstructive pulmonary disease, unspecified: Secondary | ICD-10-CM | POA: Diagnosis present

## 2017-11-23 DIAGNOSIS — B9561 Methicillin susceptible Staphylococcus aureus infection as the cause of diseases classified elsewhere: Secondary | ICD-10-CM | POA: Diagnosis present

## 2017-11-23 DIAGNOSIS — Z9889 Other specified postprocedural states: Secondary | ICD-10-CM

## 2017-11-23 DIAGNOSIS — Z6841 Body Mass Index (BMI) 40.0 and over, adult: Secondary | ICD-10-CM | POA: Diagnosis not present

## 2017-11-23 DIAGNOSIS — L03119 Cellulitis of unspecified part of limb: Secondary | ICD-10-CM | POA: Diagnosis not present

## 2017-11-23 DIAGNOSIS — Z8673 Personal history of transient ischemic attack (TIA), and cerebral infarction without residual deficits: Secondary | ICD-10-CM | POA: Diagnosis not present

## 2017-11-23 DIAGNOSIS — I10 Essential (primary) hypertension: Secondary | ICD-10-CM | POA: Diagnosis not present

## 2017-11-23 DIAGNOSIS — L03116 Cellulitis of left lower limb: Secondary | ICD-10-CM | POA: Diagnosis present

## 2017-11-23 DIAGNOSIS — Y712 Prosthetic and other implants, materials and accessory cardiovascular devices associated with adverse incidents: Secondary | ICD-10-CM | POA: Diagnosis not present

## 2017-11-23 DIAGNOSIS — L02619 Cutaneous abscess of unspecified foot: Secondary | ICD-10-CM | POA: Diagnosis not present

## 2017-11-23 DIAGNOSIS — Z8619 Personal history of other infectious and parasitic diseases: Secondary | ICD-10-CM | POA: Diagnosis not present

## 2017-11-23 DIAGNOSIS — I4891 Unspecified atrial fibrillation: Secondary | ICD-10-CM | POA: Diagnosis present

## 2017-11-23 DIAGNOSIS — G4733 Obstructive sleep apnea (adult) (pediatric): Secondary | ICD-10-CM | POA: Diagnosis present

## 2017-11-23 DIAGNOSIS — Z452 Encounter for adjustment and management of vascular access device: Secondary | ICD-10-CM

## 2017-11-23 DIAGNOSIS — E1165 Type 2 diabetes mellitus with hyperglycemia: Secondary | ICD-10-CM | POA: Diagnosis present

## 2017-11-23 DIAGNOSIS — Z79899 Other long term (current) drug therapy: Secondary | ICD-10-CM

## 2017-11-23 DIAGNOSIS — M869 Osteomyelitis, unspecified: Secondary | ICD-10-CM | POA: Diagnosis present

## 2017-11-23 DIAGNOSIS — I4821 Permanent atrial fibrillation: Secondary | ICD-10-CM

## 2017-11-23 DIAGNOSIS — Z880 Allergy status to penicillin: Secondary | ICD-10-CM

## 2017-11-23 DIAGNOSIS — N179 Acute kidney failure, unspecified: Secondary | ICD-10-CM | POA: Diagnosis present

## 2017-11-23 DIAGNOSIS — Z882 Allergy status to sulfonamides status: Secondary | ICD-10-CM

## 2017-11-23 DIAGNOSIS — Z887 Allergy status to serum and vaccine status: Secondary | ICD-10-CM

## 2017-11-23 DIAGNOSIS — E1169 Type 2 diabetes mellitus with other specified complication: Secondary | ICD-10-CM | POA: Diagnosis present

## 2017-11-23 DIAGNOSIS — Z791 Long term (current) use of non-steroidal anti-inflammatories (NSAID): Secondary | ICD-10-CM

## 2017-11-23 DIAGNOSIS — E785 Hyperlipidemia, unspecified: Secondary | ICD-10-CM | POA: Diagnosis present

## 2017-11-23 DIAGNOSIS — E1122 Type 2 diabetes mellitus with diabetic chronic kidney disease: Secondary | ICD-10-CM | POA: Diagnosis present

## 2017-11-23 DIAGNOSIS — D631 Anemia in chronic kidney disease: Secondary | ICD-10-CM | POA: Diagnosis present

## 2017-11-23 DIAGNOSIS — Z9989 Dependence on other enabling machines and devices: Secondary | ICD-10-CM | POA: Diagnosis not present

## 2017-11-23 DIAGNOSIS — R531 Weakness: Secondary | ICD-10-CM | POA: Diagnosis present

## 2017-11-23 DIAGNOSIS — N183 Chronic kidney disease, stage 3 (moderate): Secondary | ICD-10-CM | POA: Diagnosis present

## 2017-11-23 DIAGNOSIS — L02612 Cutaneous abscess of left foot: Secondary | ICD-10-CM | POA: Diagnosis present

## 2017-11-23 DIAGNOSIS — T8140XA Infection following a procedure, unspecified, initial encounter: Secondary | ICD-10-CM | POA: Diagnosis present

## 2017-11-23 DIAGNOSIS — IMO0002 Reserved for concepts with insufficient information to code with codable children: Secondary | ICD-10-CM

## 2017-11-23 DIAGNOSIS — T82524A Displacement of infusion catheter, initial encounter: Secondary | ICD-10-CM | POA: Diagnosis not present

## 2017-11-23 DIAGNOSIS — L0390 Cellulitis, unspecified: Secondary | ICD-10-CM

## 2017-11-23 DIAGNOSIS — Z7901 Long term (current) use of anticoagulants: Secondary | ICD-10-CM

## 2017-11-23 DIAGNOSIS — I480 Paroxysmal atrial fibrillation: Secondary | ICD-10-CM | POA: Diagnosis not present

## 2017-11-23 DIAGNOSIS — Z95828 Presence of other vascular implants and grafts: Secondary | ICD-10-CM | POA: Diagnosis not present

## 2017-11-23 LAB — GLUCOSE, CAPILLARY
GLUCOSE-CAPILLARY: 134 mg/dL — AB (ref 70–99)
Glucose-Capillary: 128 mg/dL — ABNORMAL HIGH (ref 70–99)

## 2017-11-23 LAB — COMPREHENSIVE METABOLIC PANEL
ALT: 14 U/L (ref 0–44)
AST: 23 U/L (ref 15–41)
Albumin: 4.1 g/dL (ref 3.5–5.0)
Alkaline Phosphatase: 57 U/L (ref 38–126)
Anion gap: 11 (ref 5–15)
BUN: 36 mg/dL — ABNORMAL HIGH (ref 8–23)
CO2: 22 mmol/L (ref 22–32)
Calcium: 10 mg/dL (ref 8.9–10.3)
Chloride: 103 mmol/L (ref 98–111)
Creatinine, Ser: 1.68 mg/dL — ABNORMAL HIGH (ref 0.61–1.24)
GFR calc Af Amer: 47 mL/min — ABNORMAL LOW (ref >60)
GFR calc non Af Amer: 40 mL/min — ABNORMAL LOW (ref >60)
Glucose, Bld: 143 mg/dL — ABNORMAL HIGH (ref 70–99)
Potassium: 4 mmol/L (ref 3.5–5.1)
Sodium: 136 mmol/L (ref 135–145)
Total Bilirubin: 1 mg/dL (ref 0.3–1.2)
Total Protein: 7.5 g/dL (ref 6.5–8.1)

## 2017-11-23 LAB — CBC WITH DIFFERENTIAL/PLATELET
Abs Immature Granulocytes: 0.1 10*3/uL (ref 0.0–0.1)
Basophils Absolute: 0.1 10*3/uL (ref 0.0–0.1)
Basophils Relative: 1 %
Eosinophils Absolute: 0.1 10*3/uL (ref 0.0–0.7)
Eosinophils Relative: 1 %
HCT: 45.7 % (ref 39.0–52.0)
Hemoglobin: 14.8 g/dL (ref 13.0–17.0)
Immature Granulocytes: 1 %
Lymphocytes Relative: 22 %
Lymphs Abs: 1.8 10*3/uL (ref 0.7–4.0)
MCH: 29.4 pg (ref 26.0–34.0)
MCHC: 32.4 g/dL (ref 30.0–36.0)
MCV: 90.7 fL (ref 78.0–100.0)
Monocytes Absolute: 0.7 10*3/uL (ref 0.1–1.0)
Monocytes Relative: 9 %
Neutro Abs: 5.4 10*3/uL (ref 1.7–7.7)
Neutrophils Relative %: 66 %
Platelets: 239 10*3/uL (ref 150–400)
RBC: 5.04 MIL/uL (ref 4.22–5.81)
RDW: 15.1 % (ref 11.5–15.5)
WBC: 8.2 10*3/uL (ref 4.0–10.5)

## 2017-11-23 LAB — C-REACTIVE PROTEIN: CRP: <0.8 mg/dL (ref <1.0)

## 2017-11-23 MED ORDER — IBUPROFEN 400 MG PO TABS
400.0000 mg | ORAL_TABLET | Freq: Every day | ORAL | Status: DC
  Administered 2017-11-23 – 2017-11-26 (×4): 400 mg via ORAL
  Filled 2017-11-23 (×4): qty 1

## 2017-11-23 MED ORDER — POVIDONE-IODINE 7.5 % EX SOLN
1.0000 "application " | Freq: Once | CUTANEOUS | Status: DC
  Filled 2017-11-23: qty 118

## 2017-11-23 MED ORDER — INSULIN ASPART 100 UNIT/ML ~~LOC~~ SOLN
0.0000 [IU] | Freq: Three times a day (TID) | SUBCUTANEOUS | Status: DC
  Administered 2017-11-23: 3 [IU] via SUBCUTANEOUS
  Administered 2017-11-24: 7 [IU] via SUBCUTANEOUS
  Administered 2017-11-24 (×2): 3 [IU] via SUBCUTANEOUS
  Administered 2017-11-25 (×3): 4 [IU] via SUBCUTANEOUS
  Administered 2017-11-26 – 2017-11-27 (×3): 3 [IU] via SUBCUTANEOUS
  Administered 2017-11-27: 4 [IU] via SUBCUTANEOUS
  Administered 2017-11-27: 3 [IU] via SUBCUTANEOUS

## 2017-11-23 MED ORDER — TRAMADOL HCL 50 MG PO TABS: 50.0000 mg | ORAL_TABLET | Freq: Three times a day (TID) | ORAL | Status: DC | PRN

## 2017-11-23 MED ORDER — AMLODIPINE BESYLATE 5 MG PO TABS
5.0000 mg | ORAL_TABLET | Freq: Every day | ORAL | Status: DC
  Filled 2017-11-23: qty 1

## 2017-11-23 MED ORDER — INSULIN ASPART 100 UNIT/ML ~~LOC~~ SOLN: 0.0000 [IU] | Freq: Three times a day (TID) | SUBCUTANEOUS | Status: DC

## 2017-11-23 MED ORDER — INSULIN ASPART 100 UNIT/ML ~~LOC~~ SOLN: 0.0000 [IU] | Freq: Every day | SUBCUTANEOUS | Status: DC

## 2017-11-23 MED ORDER — VANCOMYCIN HCL 10 G IV SOLR
2000.0000 mg | Freq: Once | INTRAVENOUS | Status: AC
  Administered 2017-11-23: 2000 mg via INTRAVENOUS
  Filled 2017-11-23: qty 2000

## 2017-11-23 MED ORDER — METOPROLOL SUCCINATE ER 50 MG PO TB24
50.0000 mg | ORAL_TABLET | Freq: Every day | ORAL | Status: DC
  Filled 2017-11-23: qty 1

## 2017-11-23 MED ORDER — VITAMIN D 1000 UNITS PO TABS
4000.0000 [IU] | ORAL_TABLET | Freq: Every day | ORAL | Status: DC
  Administered 2017-11-23 – 2017-11-27 (×5): 4000 [IU] via ORAL
  Filled 2017-11-23 (×5): qty 4

## 2017-11-23 MED ORDER — VANCOMYCIN HCL 10 G IV SOLR
1500.0000 mg | Freq: Two times a day (BID) | INTRAVENOUS | Status: DC
  Administered 2017-11-24: 1500 mg via INTRAVENOUS
  Filled 2017-11-23 (×2): qty 1500

## 2017-11-23 MED ORDER — INSULIN ASPART 100 UNIT/ML ~~LOC~~ SOLN
0.0000 [IU] | Freq: Every day | SUBCUTANEOUS | Status: DC
  Administered 2017-11-24: 2 [IU] via SUBCUTANEOUS

## 2017-11-23 MED ORDER — BENAZEPRIL HCL 20 MG PO TABS
20.0000 mg | ORAL_TABLET | Freq: Every evening | ORAL | Status: DC
  Filled 2017-11-23: qty 1

## 2017-11-23 MED ORDER — FENOFIBRIC ACID 105 MG PO TABS: 1.0000 | ORAL_TABLET | Freq: Every day | ORAL | Status: DC

## 2017-11-23 MED ORDER — FENOFIBRATE 54 MG PO TABS
108.0000 mg | ORAL_TABLET | Freq: Every day | ORAL | Status: DC
  Administered 2017-11-23 – 2017-11-26 (×4): 108 mg via ORAL
  Filled 2017-11-23 (×5): qty 2

## 2017-11-23 MED ORDER — CHLORHEXIDINE GLUCONATE 4 % EX LIQD
60.0000 mL | Freq: Once | CUTANEOUS | Status: AC
  Administered 2017-11-24: 4 via TOPICAL

## 2017-11-23 MED ORDER — METOPROLOL TARTRATE 25 MG PO TABS
25.0000 mg | ORAL_TABLET | Freq: Two times a day (BID) | ORAL | Status: DC
  Administered 2017-11-24 – 2017-11-27 (×5): 25 mg via ORAL
  Filled 2017-11-23 (×5): qty 1

## 2017-11-23 MED ORDER — APIXABAN 5 MG PO TABS: 5.0000 mg | ORAL_TABLET | Freq: Two times a day (BID) | ORAL | Status: DC

## 2017-11-23 NOTE — Progress Notes (Signed)
12:18 PM  Discussed with Dr. Logan BoresEvans and he was able to see photo of his foot today in Epic and agrees he will need surgery sooner than planned. Unable to arrange for PICC placement after consulting with both [facility] and [facility] IR team prior to Tuesday of next week.  I called [name] to let him know that I was going to try to arrange direct admission to the hospital for PICC line placement and initiation of IV Vancomycin today through the [facility] service who managed him last time. Dr. Logan BoresEvans will see him tonight if he can be admitted soon.   Welcomed and

## 2017-11-23 NOTE — H&P (Signed)
History and Physical    William [name] DOB: [birth year] DOA: 11/23/2017  PCP: William OkMoreira, Roy, MD Consultants:  ID, podiatry Patient coming from: home- lives with  Chief Complaint: Foot wound  HPI: William Cameron is a 67 y.o. male with medical history significant for L foot cellulitis/abscess, uncontrolled DM2, CVA, OSA, HTN, morbid obesity who underwent L foot surgery in mid June by Dr. Logan Cameron (podiatry) who subsequently developed an abscess of the incision site which his son drained with a sterilized needle. Afterwards swelling and erythema improved and he took some leftover levaquin 750 mg BID. He presented to Dr. Logan Cameron' office on 8/28 and cultures were drawn which ultimately grew MRSA. Plan was to remove screw on 9/11 with debridement and his LVQ was refilled; however, pt felt foot wound was getting worse, with increased pain and swelling and erythema. Serous fluid now draining from the top of his foot. He has had no documented fevers, no night sweats but overall has felt worse and has had increased weakness. Cultures from 8/28 were negative. He was directly admitted with the plan of getting a PICC and IV vanc as well as more immediate surgical treatment.  Currently pt denies pain; only has pain when tries to stand or walk. He denies chest pain, dyspnea, fevers/chills/sweats. He has had some lightheadedness and general weakness/fatigue that has been progressive since June.  Review of Systems: As per HPI; otherwise review of systems reviewed and negative.    Past Medical History:  Diagnosis Date  . Bronchitis    chronic bronchitis  . COPD (chronic obstructive pulmonary disease) (HCC)   . Diabetes mellitus without complication (HCC)   . Elevated lactic acid level   . Hypertension   . PONV (postoperative nausea and vomiting)    post op nausea  . Severe obesity (BMI >= 40) (HCC) 09/03/2013   patien tlost 55 pounds, from 450 pounds, arthritism neuropathy, diabetes , high risk for  sleep apnea. [employer] driver.   . Sleep apnea    cpap at night    Past Surgical History:  Procedure Laterality Date  . CARDIOVERSION N/A 01/25/2016   Procedure: CARDIOVERSION;  Surgeon: Yates DecampJay Ganji, MD;  [facility];  Service: Cardiovascular;  Laterality: N/A;  . COLONOSCOPY  02/02/2012   Procedure: COLONOSCOPY;  Surgeon: Theda BelfastPatrick D Hung, MD;  Location: [facility] ENDOSCOPY;  Service: Endoscopy;  Laterality: N/A;  . HERNIA REPAIR    . HYDROCELE EXCISION / REPAIR    . JOINT REPLACEMENT     rt knee  . ROTATOR CUFF REPAIR  rt  . TOTAL HIP ARTHROPLASTY Right 07/14/2014   Procedure: RIGHT TOTAL HIP ARTHROPLASTY;  Surgeon: Durene RomansMatthew Olin, MD;  Location: [facility] ORS;  Service: Orthopedics;  Laterality: Right;    Social History   Socioeconomic History  . Marital status: Married    Spouse name: William Cameron  . Number of children: 1  . Years of education: 12+  . Highest education level: Not on file  Occupational History    Employer: [employer]  Social Needs  . Financial resource strain: Not on file  . Food insecurity:    Worry: Not on file    Inability: Not on file  . Transportation needs:    Medical: Not on file    Non-medical: Not on file  Tobacco Use  . Smoking status: Never Smoker  . Smokeless tobacco: Never Used  Substance and Sexual Activity  . Alcohol use: No  . Drug use: No  . Sexual activity: Never  Lifestyle  . Physical activity:    Days per week: Not on file    Minutes per session: Not on file  . Stress: Not on file  Relationships  . Social connections:    Talks on phone: Not on file    Gets together: Not on file    Attends religious service: Not on file    Active member of club or organization: Not on file    Attends meetings of clubs or organizations: Not on file    Relationship status: Not on file  . Intimate partner violence:    Fear of current or ex partner: Not on file    Emotionally abused: Not on file    Physically abused: Not on file    Forced sexual activity:  Not on file  Other Topics Concern  . Not on file  Social History Narrative   Patient is married William Cameron) and lives at home with his wife.   Patient has one adult child.   Patient is working full-time.   Patient has a high school and auto classes.   Patient is right-handed.   Patient drinks two cups of coffee every morning and very little tea.    Allergies  Allergen Reactions  . Penicillins Anaphylaxis    Has patient had a PCN reaction causing immediate rash, facial/tongue/throat swelling, SOB or lightheadedness with hypotension: Yes Has patient had a PCN reaction causing severe rash involving mucus membranes or skin necrosis: Yes Has patient had a PCN reaction that required hospitalization Yes Has patient had a PCN reaction occurring within the last 10 years: No If all of the above answers are "NO", then may proceed with Cephalosporin use.  . Tetanus Toxoids Anaphylaxis, Shortness Of Breath and Swelling  . Sulfa Antibiotics Nausea And Vomiting and Swelling    Family History  Problem Relation Age of Onset  . Diabetes type II Mother   . Prostate cancer Father 65    Prior to Admission medications   Medication Sig Start Date End Date Taking? Authorizing Provider  amLODipine (NORVASC) 5 MG tablet Take 5 mg by mouth daily.   Yes [provider]  apixaban (ELIQUIS) 5 MG TABS tablet Take 5 mg by mouth 2 (two) times daily.   Yes [provider]  benazepril (LOTENSIN) 40 MG tablet Take 40 mg by mouth daily after supper.    Yes [provider]  Cholecalciferol (VITAMIN D) 2000 units tablet Take 4,000 Units by mouth daily.    Yes [provider]  dapagliflozin propanediol (FARXIGA) 5 MG TABS tablet Take 5 mg by mouth 2 (two) times daily.   Yes [provider]  fenofibrate micronized (ANTARA) 130 MG capsule Take 130 mg by mouth daily after supper.  10/06/17  Yes [provider]  ibuprofen (ADVIL,MOTRIN) 200 MG tablet Take 400 mg by mouth  at bedtime.    Yes [provider]  metoprolol succinate (TOPROL-XL) 50 MG 24 hr tablet Take 50 mg by mouth daily after supper. Take with or immediately following a meal.    Yes [provider]  PRESCRIPTION MEDICATION Inhale into the lungs at bedtime. CPAP   Yes [provider]  traMADol (ULTRAM) 50 MG tablet Take one tablet every 8 hours prn foot pain. Patient taking differently: Take 50 mg by mouth every 8 (eight) hours as needed (foot pain).  09/10/17  Yes Felecia Shelling, DPM  levofloxacin (LEVAQUIN) 750 MG tablet Take 1 tablet (750 mg total) by mouth daily. Patient not taking:  Reported on 11/23/2017 11/21/17   Felecia Shelling, DPM    Physical Exam: Vitals:   11/23/17 1505  BP: 96/62  Pulse: 73  Resp: 18  Temp: 98.1 F (36.7 C)  TempSrc: Oral  SpO2: 97%  Weight: (!) 155.2 kg  Height: 6' (1.829 m)     . General: Obese male, in NAD. Appears calm and comfortable and is in NAD . Eyes:  PERRL, EOMI, normal lids, iris . ENT: mildly reduced hearing, lips & tongue, mmm; appropriate dentition . Neck:  no LAD, masses or thyromegaly; no carotid bruits . Cardiovascular: mild tachy, reg rhythm, no murmur. Marland Kitchen Respiratory:   CTA bilaterally with no wheezes/rales/rhonchi.  Normal respiratory effort. . Abdomen: obese, soft, NT, ND, NABS . Back:  grossly normal alignment, no CVAT . Skin:  Medial dorsal L foot with erythema and localized edema; area is warm to touch. Abscess of same area with minimal serious drainage.  . Musculoskeletal:  grossly normal tone BUE/BLE, good ROM, no bony abnormality or obvious joint deformity . Lower extremity:  No LE edema.  Limited foot exam with no ulcerations.  2+ distal pulses. Marland Kitchen Psychiatric:  grossly normal mood and affect, speech fluent and appropriate, AOx3 . Neurologic:  CN 2-12 grossly intact, moves all extremities in coordinated fashion, sensation intact    Radiological Exams on Admission: No results found.  EKG:  N/A   Labs on Admission: I have personally reviewed the available labs and imaging studies at the time of the admission.  Pertinent labs:  Gluc 143 BUN 36 Creat 1.68 (baseline appears to be 1.3 - 1.6) WBC 8.2 Hgb 14.8 Plt 239   Assessment/Plan Principal Problem:   Cellulitis and abscess of left foot Active Problems:   Severe obesity (BMI >= 40) (HCC)   OSA on CPAP   Essential hypertension   DM (diabetes mellitus), type 2, uncontrolled w/neurologic complication (HCC)   Cellulitis L foot, r/o OM L foot -s/p bunion and hammertoe surgery September 06 2017 -admit to med surg -saw pt on the floor with his podiatrist Dr. Logan Bores -BCx x 2 pending -vancomycin per ID recs -PICC ordered; per RN will be placed tomorrow -check CRP -monitor CBC, temp, vitals -NPO after MN for I and D, removal of screws, bone bx L foot tomorrow morning. Plan is also to place antibiotic beads -f/u blood cultures, operative cultures -appreciate podiatry and ID assistance -hold Eliquis until after surgery  DM2 -not on home insulin -hold home oral hypoglycemics -SSI -carb consistent diet; NPO after MN  Essential HTN -pt states his BP has been lower than usual for past 2 weeks; it is low here as well -hold amlodipine and benazepril for now -continue metoprolol given h/o AF; will change from long-acting to short acting in case BP drops  H/o AF, on chronic anticoagulation -cont BB (changes as above) -hold Eliquis for surgery  OSA -cont CPAP per home settings   DVT prophylaxis: Had Eliquis today; hold AC for surgery tomorrow; then resume Code Status:  Full - confirmed with patient/family Family Communication: wife at bedside  Disposition Plan:  Home once clinically improved Consults called: Podiatry, ID  Admission status: Admit - It is my clinical opinion that admission to INPATIENT is reasonable and necessary because of the expectation that this patient will require hospital care that crosses at least  2 midnights to treat this condition based on the medical complexity of the problems presented.  Given the aforementioned information, the predictability of an adverse outcome is felt to  be significant.     Elyse Hsu MD [facility]  If note is complete, please contact covering daytime or nighttime physician. [email or web address] Password [email or web address]  11/23/2017, 5:59 PM

## 2017-11-23 NOTE — Telephone Encounter (Signed)
Per Judeth CornfieldStephanie, called med surge for bed request Dr. Susie CassetteAbrol for admitting. Gregary SignsSean will call patient 11/23/17 12:27.

## 2017-11-23 NOTE — Progress Notes (Signed)
Pt new direct admit from [facility], alert and oriented with wife at the bedside, with left foot swelling had surgery last 09/06/17 and became infected ,  admitting nurse aware she said Dr. Morrison OldLambeth was informed.

## 2017-11-23 NOTE — Progress Notes (Addendum)
Patient: William Cameron  DOB: 14-Mar-1951 MRN: [id number] PCP: Ralene OkMoreira, Roy, MD  Referring Provider: [facility] cellulitis  Podiatrist: Logan BoresEvans, MD   Patient Active Problem List   Diagnosis Date Noted  . Cellulitis and abscess of left foot 10/09/2017    Priority: High  . DM (diabetes mellitus), type 2, uncontrolled w/neurologic complication (HCC) 10/05/2014    Priority: Medium  . Cellulitis 11/23/2017  . Unspecified atrial fibrillation (HCC) 11/23/2017  . Super obese 10/24/2017  . Medication management 10/23/2017  . Diabetic foot infection (HCC) 10/08/2017  . Stroke (HCC)   . Insomnia with sleep apnea 01/24/2016  . Atrial fibrillation, new onset (HCC) 11/12/2015  . Essential hypertension 10/05/2014  . Morbid obesity (HCC) 07/15/2014  . 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 OSA on CPAP 10/21/2013  . Obesity with sleep apnea 10/21/2013  . Severe obesity (BMI >= 40) (HCC) 09/03/2013     Subjective:  William Cameron is a 67 y.o. obese, diabetic male.   He is here for follow up on his left foot today. Saturday new abscess formation developed quickly. His son drained this for him at home with "sterilized" needle where bloody/purlent drainage was evacuated. He began taking left over levaquin (twice a day at first) and the foot looked better. He saw Dr. Logan BoresEvans 48h ago and plan for removal of screw on 9-11 with debridement. He was given more levaquin but William feels like his foot is getting worse now that he has been walking around here today. When he woke up his foot was normal color but now throbbing pain, swelling an dark. He initially had no drainage this morning but now serous fluid is leaking from the top of his foot. No night sweats but feels overall poorly and weaker with this process lasting now nearly 2 months.   Review of Systems  Constitutional: Negative for chills, fever, malaise/fatigue and weight loss.  HENT: Negative for sore throat.   Respiratory: Negative for cough and sputum  production.   Cardiovascular: Positive for leg swelling (Left lower foot ). Negative for chest pain.  Gastrointestinal: Negative for abdominal pain, diarrhea and vomiting.  Genitourinary: Negative for dysuria and flank pain.  Musculoskeletal: Negative for joint pain, myalgias and neck pain.  Skin: Negative for rash.  Neurological: Negative for dizziness, tingling and headaches.  Psychiatric/Behavioral: Negative for depression and substance abuse. The patient is not nervous/anxious and does not have insomnia.     Past Medical History:  Diagnosis Date  . Bronchitis    chronic bronchitis  . COPD (chronic obstructive pulmonary disease) (HCC)   . Diabetes mellitus without complication (HCC)   . Elevated lactic acid level   . Hypertension   . PONV (postoperative nausea and vomiting)    post op nausea  . 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   . Sleep apnea    cpap at night    No facility-administered medications prior to visit.    Outpatient Medications Prior to Visit  Medication Sig Dispense Refill  . amLODipine (NORVASC) 5 MG tablet Take 5 mg by mouth daily.    Marland Kitchen. apixaban (ELIQUIS) 5 MG TABS tablet Take 5 mg by mouth 2 (two) times daily.    . benazepril (LOTENSIN) 40 MG tablet Take 40 mg by mouth daily after supper.     . Cholecalciferol (VITAMIN D) 2000 units tablet Take  4,000 Units by mouth daily.     . fenofibrate micronized (ANTARA) 130 MG capsule Take 130 mg by mouth daily after supper.   3  . ibuprofen (ADVIL,MOTRIN) 200 MG tablet Take 400 mg by mouth at bedtime.     Marland Kitchen levofloxacin (LEVAQUIN) 750 MG tablet Take 1 tablet (750 mg total) by mouth daily. (Patient not taking: Reported on 11/23/2017) 10 tablet 0  . metoprolol succinate (TOPROL-XL) 50 MG 24 hr tablet Take 50 mg by mouth daily after supper. Take with or immediately following a meal.     . traMADol  (ULTRAM) 50 MG tablet Take one tablet every 8 hours prn foot pain. (Patient taking differently: Take 50 mg by mouth every 8 (eight) hours as needed (foot pain). ) 30 tablet 0  . FARXIGA 5 MG TABS Take 5 mg by mouth daily.     . Fenofibric Acid 105 MG TABS Take 1 tablet by mouth daily. Reported on 06/02/2015  5     Allergies  Allergen Reactions  . Penicillins Anaphylaxis    Has patient had a PCN reaction causing immediate rash, facial/tongue/throat swelling, SOB or lightheadedness with hypotension: Yes Has patient had a PCN reaction causing severe rash involving mucus membranes or skin necrosis: Yes Has patient had a PCN reaction that required hospitalization Yes Has patient had a PCN reaction occurring within the last 10 years: No If all of the above answers are "NO", then may proceed with Cephalosporin use.  . Tetanus Toxoids Anaphylaxis, Shortness Of Breath and Swelling  . Sulfa Antibiotics Nausea And Vomiting and Swelling    Social History   Tobacco Use  . Smoking status: Never Smoker  . Smokeless tobacco: Never Used  Substance Use Topics  . Alcohol use: No  . Drug use: No    Family History  Problem Relation Age of Onset  . Diabetes type II Mother   . Prostate cancer Father 74    Objective:   Vitals:   11/23/17 0924  BP: 115/73  Pulse: 60  Temp: 97.8 F (36.6 C)  Weight: (!) 336 lb (152.4 kg)   Body mass index is 45.57 kg/m.  Physical Exam  Constitutional: He is oriented to person, place, and time. He appears well-developed.  Obese, pleasant man seated in chair. Very concerned about his foot.   Eyes: Pupils are equal, round, and reactive to light. No scleral icterus.  Cardiovascular: Normal rate and regular rhythm.  Pulmonary/Chest: Effort normal. No respiratory distress.  Abdominal: Soft.  Musculoskeletal: He exhibits edema.  Pictures below.   Lymphadenopathy:    He has no cervical adenopathy.  Neurological: He is alert and oriented to person, place, and  time.  Skin: Skin is warm and dry. Capillary refill takes less than 2 seconds.   11/21/2017    11/26/2017        Lab Results: Lab Results  Component Value Date   WBC 7.4 11/26/2017   HGB 12.4 (L) 11/26/2017   HCT 39.9 11/26/2017   MCV 92.4 11/26/2017   PLT 165 11/26/2017    Lab Results  Component Value Date   CREATININE 1.41 (H) 11/26/2017   BUN 20 11/26/2017   NA 137 11/26/2017   K 4.3 11/26/2017   CL 107 11/26/2017   CO2 22 11/26/2017    Lab Results  Component Value Date   ALT 13 11/26/2017   AST 17 11/26/2017   ALKPHOS 48 11/26/2017   BILITOT 0.6 11/26/2017     Assessment & Plan:   Problem  List Items Addressed This Visit      Other   Cellulitis and abscess of left foot - Primary    Previously recovered MSSA (R-tetracycline) from wound. I would anticipate this to still be the case considering how acutely this flared again. I worry about induced resistance to Levaquin as his foot looks much worse compared to 48h prior. I will try to arrange for outpatient PICC line placement today to put him back on vancomycin but more likely he will need direct admission today as IR is not likely to have an availability. He is willing.   I feel strongly he needs surgery sooner at least for debridement of the abscess to prevent further damage/infection. He will need Vancomycin. I have placed a call to Dr. Logan Bores to discuss and will call William Cameron with the plan shortly. I have asked him to keep the abscess covered with gauze dressing (I gave him a few allevyn foam pads today for the drive home) and keep the foot elevated above the level of the heart for now.   Will call William Cameron @ [phone]        No follow-ups on file.  Rexene Alberts, MSN, NP-C [facility] Pager: [phone] Office: [phone]  11/26/17  12:06 PM

## 2017-11-23 NOTE — Progress Notes (Signed)
Pharmacy Antibiotic Note  Shirlean MylarMichael E Roberge is a 67 y.o. male admitted on 11/23/2017 with wound infection.  Pharmacy has been consulted for vanc dosing.  Casimiro NeedleMichael is a status post bunionectomy on 09/06/17. He noticed some abscess to this incision site about a week ago. He is admitted today for an I&D. His previous culture grew out MRSA that is resistant to clinda and doxy. Vanc is ordered to be started today.   Scr 1.35 (10/10/17), wt 155kg  Plan: Vanc 2g IV x1 then 1.5g IV q12 Bmet pending Trough PRN  Height: 6' (182.9 cm) Weight: (!) 342 lb 2.5 oz (155.2 kg) IBW/kg (Calculated) : 77.6  Temp (24hrs), Avg:98 F (36.7 C), Min:97.8 F (36.6 C), Max:98.1 F (36.7 C)  No results for input(s): WBC, CREATININE, LATICACIDVEN, VANCOTROUGH, VANCOPEAK, VANCORANDOM, GENTTROUGH, GENTPEAK, GENTRANDOM, TOBRATROUGH, TOBRAPEAK, TOBRARND, AMIKACINPEAK, AMIKACINTROU, AMIKACIN in the last 168 hours.  CrCl cannot be calculated (Patient's most recent lab result is older than the maximum 21 days allowed.).    Allergies  Allergen Reactions  . Penicillins Anaphylaxis    Has patient had a PCN reaction causing immediate rash, facial/tongue/throat swelling, SOB or lightheadedness with hypotension: Yes Has patient had a PCN reaction causing severe rash involving mucus membranes or skin necrosis: Yes Has patient had a PCN reaction that required hospitalization Yes Has patient had a PCN reaction occurring within the last 10 years: No If all of the above answers are "NO", then may proceed with Cephalosporin use.  . Tetanus Toxoids Anaphylaxis, Shortness Of Breath and Swelling  . Sulfa Antibiotics Nausea And Vomiting and Swelling    Antimicrobials this admission: 8/30 vanc >>   Dose adjustments this admission:   Microbiology results: 8/30 BCx:  UCx:  Sputum:  MRSA PCR:   Ulyses SouthwardMinh Yeray Tomas, PharmD, BCIDP, AAHIVP, CPP Infectious Disease Pharmacist Pager: [phone] 11/23/2017 3:34 PM

## 2017-11-23 NOTE — H&P (View-Only) (Signed)
HPI: William Cameron 67 year old male history significant for left foot cellulitis/abscess, DM2, CVA, HTN, morbid obesity [facility], Rexene AlbertsStephanie Dixon NP, for cellulitis and abscess of the left foot.  Patient underwent bunionectomy and hammertoe surgery left foot on 09/06/2017.  Patient subsequently developed a postoperative infection that had improved until approximately 1 week ago.  He noticed an abscess to the very proximal incision site of the left foot about the size of his thumbnail.  He states that his son ruptured the abscess with a sterilized he did needle.  There is a large amount of purulent hemorrhagic fluid and drainage.  Patient was last seen in our office on 11/21/2017.  New cultures were taken and re-prescribed oral Levaquin 750 mg daily.  He had a follow-up appointment with infectious disease today, 11/23/2017, where he was seen by Rexene AlbertsStephanie Dixon NP.  The patient's condition of his left foot has worsened over the past 2 days and direct admission was ordered.  Patient was seen this evening at bedside with the admitting hospitalist, Dr. Morrison OldLambeth MD.   Past Medical History:  Diagnosis Date  . Bronchitis    chronic bronchitis  . COPD (chronic obstructive pulmonary disease) (HCC)   . Diabetes mellitus without complication (HCC)   . Elevated lactic acid level   . Hypertension   . PONV (postoperative nausea and vomiting)    post op nausea  . 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   . Sleep apnea    cpap at night     CBC Latest Ref Rng & Units 11/23/2017 10/23/2017 10/09/2017  WBC 4.0 - 10.5 K/uL 8.2 5.5 8.7  Hemoglobin 13.0 - 17.0 g/dL 16.114.8 09.615.2 04.514.9  Hematocrit 39.0 - 52.0 % 45.7 45.5 46.4  Platelets 150 - 400 K/uL 239 132(L) 195   Physical Exam: General: The patient is alert and oriented x3 in  no acute distress. Dermatology: Significant amount of erythema and edema localized to the left foot.  Draining sinus abscess noted left foot dorsal medial.  No significant malodor noted. Neurovascular status intact.  PT DP pulses palpable bilateral.  Significant warmth noted to the left foot.  Capillary refill immediate to the digits of the left foot.  Epicritic and protective threshold intact bilateral lower extremities. Musculoskeletal Exam: Status post left forefoot surgery.  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 DOS: 09/06/2017  -Patient will benefit from emergent surgical care.  Surgery will consist of incision and drainage left foot.  Removal of orthopedic screws x2 left foot.  Bone biopsy left foot.  Placement of antibiotic beads left foot. -I discussed with the patient the surgical plan of care and the patient agrees.  Patient scheduled for surgery tomorrow, 11/24/2017, @ approximately 9:30AM -Order for IV vancomycin order already placed.  Continue antibiotic care as per infectious disease management.  Latest cultures on 11/21/2017 were negative for growth. -Podiatry will continue to follow while inpatient.      Felecia ShellingBrent M. Evans, DPM [facility]  Dr. Felecia ShellingBrent M. Evans, DPM    [address]  [place], [place] [address]                Office [phone]  Fax [phone]

## 2017-11-23 NOTE — Telephone Encounter (Signed)
[facility] and the hospital has restricted him from eating but he hasn't ate all day and he

## 2017-11-23 NOTE — Assessment & Plan Note (Addendum)
Previously recovered MSSA (R-tetracycline) from wound. I would anticipate this to still be the case considering how acutely this flared again. I worry about induced resistance to Levaquin as his foot looks much worse compared to 48h prior. I will try to arrange for outpatient PICC line placement today to put him back on vancomycin but more likely he will need direct admission today as IR is not likely to have an availability. He is willing.   I feel strongly he needs surgery sooner at least for debridement of the abscess to prevent further damage/infection. He will need Vancomycin. I have placed a call to Dr. Logan BoresEvans to discuss and will call William Cameron with the plan shortly. I have asked him to keep the abscess covered with gauze dressing (I gave him a few allevyn foam pads today for the drive home) and keep the foot elevated above the level of the heart for now.   Will call William Cameron @ [phone]

## 2017-11-23 NOTE — Patient Instructions (Signed)
Will call you as soon as I discuss with Dr. Logan BoresEvans.   I feel you need surgery sooner than planned. I would like to get a PICC line (special IV) placed to do IV antibiotics now but feel we may need to go ahead and get you admitted to the hospital to do that.   If he agrees this is best I will try to coordinate direct admission to [facility] today in hopes to avoid the Emergency Room for you.

## 2017-11-23 NOTE — Consult Note (Signed)
 HPI: William Cameron 67-year-old male history significant for left foot cellulitis/abscess, DM2, CVA, HTN, morbid obesity presents as [facility] Stephanie Dixon NP, for cellulitis and abscess of the left foot.  Patient underwent bunionectomy and hammertoe surgery left foot on 09/06/2017.  Patient subsequently developed a postoperative infection that had improved until approximately 1 week ago.  He noticed an abscess to the very proximal incision site of the left foot about the size of his thumbnail.  He states that his son ruptured the abscess with a sterilized he did needle.  There is a large amount of purulent hemorrhagic fluid and drainage.  Patient was last seen in our office on 11/21/2017.  New cultures were taken and re-prescribed oral Levaquin 750 mg daily.  He had a follow-up appointment with infectious disease today, 11/23/2017, where he was seen by Stephanie Dixon NP.  The patient's condition of his left foot has worsened over the past 2 days and direct admission was ordered.  Patient was seen this evening at bedside with the admitting hospitalist, Dr. Lambeth MD.   Past Medical History:  Diagnosis Date  . Bronchitis    chronic bronchitis  . COPD (chronic obstructive pulmonary disease) (HCC)   . Diabetes mellitus without complication (HCC)   . Elevated lactic acid level   . Hypertension   . PONV (postoperative nausea and vomiting)    post op nausea  . 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   . Sleep apnea    cpap at night     CBC Latest Ref Rng & Units 11/23/2017 10/23/2017 10/09/2017  WBC 4.0 - 10.5 K/uL 8.2 5.5 8.7  Hemoglobin 13.0 - 17.0 g/dL 14.8 15.2 14.9  Hematocrit 39.0 - 52.0 % 45.7 45.5 46.4  Platelets 150 - 400 K/uL 239 132(L) 195   Physical Exam: General: The patient is alert and oriented x3 in  no acute distress. Dermatology: Significant amount of erythema and edema localized to the left foot.  Draining sinus abscess noted left foot dorsal medial.  No significant malodor noted. Neurovascular status intact.  PT DP pulses palpable bilateral.  Significant warmth noted to the left foot.  Capillary refill immediate to the digits of the left foot.  Epicritic and protective threshold intact bilateral lower extremities. Musculoskeletal Exam: Status post left forefoot surgery.  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 DOS: 09/06/2017  -Patient will benefit from emergent surgical care.  Surgery will consist of incision and drainage left foot.  Removal of orthopedic screws x2 left foot.  Bone biopsy left foot.  Placement of antibiotic beads left foot. -I discussed with the patient the surgical plan of care and the patient agrees.  Patient scheduled for surgery tomorrow, 11/24/2017, @ approximately 9:30AM -Order for IV vancomycin order already placed.  Continue antibiotic care as per infectious disease management.  Latest cultures on 11/21/2017 were negative for growth. -Podiatry will continue to follow while inpatient.      Brent M. Evans, DPM [facility]  Dr. Brent M. Evans, DPM    [address]                                          [place], [place] [address]                Office [phone]  Fax [phone]

## 2017-11-23 NOTE — Progress Notes (Signed)
Pt states his wife is bringing home CPAP for use tonight while admitted. Advised pt to notify for RT if any further assistance is required.

## 2017-11-24 ENCOUNTER — Inpatient Hospital Stay (HOSPITAL_COMMUNITY): Payer: Medicare HMO | Admitting: Anesthesiology

## 2017-11-24 ENCOUNTER — Inpatient Hospital Stay (HOSPITAL_COMMUNITY): Payer: Medicare HMO

## 2017-11-24 ENCOUNTER — Encounter (HOSPITAL_COMMUNITY)
Admission: AD | Disposition: A | Discharge status: Home / Self Care | Payer: Self-pay | Source: Ambulatory Visit | Attending: Internal Medicine

## 2017-11-24 DIAGNOSIS — Z887 Allergy status to serum and vaccine status: Secondary | ICD-10-CM

## 2017-11-24 DIAGNOSIS — L03116 Cellulitis of left lower limb: Secondary | ICD-10-CM

## 2017-11-24 DIAGNOSIS — E1165 Type 2 diabetes mellitus with hyperglycemia: Secondary | ICD-10-CM

## 2017-11-24 DIAGNOSIS — Z8619 Personal history of other infectious and parasitic diseases: Secondary | ICD-10-CM

## 2017-11-24 DIAGNOSIS — Z882 Allergy status to sulfonamides status: Secondary | ICD-10-CM

## 2017-11-24 DIAGNOSIS — G4733 Obstructive sleep apnea (adult) (pediatric): Secondary | ICD-10-CM

## 2017-11-24 DIAGNOSIS — I480 Paroxysmal atrial fibrillation: Secondary | ICD-10-CM

## 2017-11-24 DIAGNOSIS — Z9989 Dependence on other enabling machines and devices: Secondary | ICD-10-CM

## 2017-11-24 DIAGNOSIS — L02612 Cutaneous abscess of left foot: Secondary | ICD-10-CM

## 2017-11-24 DIAGNOSIS — L03119 Cellulitis of unspecified part of limb: Secondary | ICD-10-CM

## 2017-11-24 DIAGNOSIS — E1149 Type 2 diabetes mellitus with other diabetic neurological complication: Secondary | ICD-10-CM

## 2017-11-24 DIAGNOSIS — I10 Essential (primary) hypertension: Secondary | ICD-10-CM

## 2017-11-24 DIAGNOSIS — L02619 Cutaneous abscess of unspecified foot: Secondary | ICD-10-CM

## 2017-11-24 DIAGNOSIS — Z880 Allergy status to penicillin: Secondary | ICD-10-CM

## 2017-11-24 HISTORY — PX: I & D EXTREMITY: SHX5045

## 2017-11-24 LAB — GLUCOSE, CAPILLARY
GLUCOSE-CAPILLARY: 141 mg/dL — AB (ref 70–99)
GLUCOSE-CAPILLARY: 226 mg/dL — AB (ref 70–99)
Glucose-Capillary: 150 mg/dL — ABNORMAL HIGH (ref 70–99)
Glucose-Capillary: 216 mg/dL — ABNORMAL HIGH (ref 70–99)
Glucose-Capillary: 237 mg/dL — ABNORMAL HIGH (ref 70–99)
Glucose-Capillary: 201 mg/dL — ABNORMAL HIGH (ref 70–99)

## 2017-11-24 LAB — CBC
HCT: 46.4 % (ref 39.0–52.0)
Hemoglobin: 14.6 g/dL (ref 13.0–17.0)
MCH: 28.9 pg (ref 26.0–34.0)
MCHC: 31.5 g/dL (ref 30.0–36.0)
MCV: 91.7 fL (ref 78.0–100.0)
Platelets: 219 10*3/uL (ref 150–400)
RBC: 5.06 MIL/uL (ref 4.22–5.81)
RDW: 14.8 % (ref 11.5–15.5)
WBC: 7.2 10*3/uL (ref 4.0–10.5)

## 2017-11-24 LAB — WOUND CULTURE
MICRO NUMBER:: 91030092
RESULT:: NO GROWTH
SPECIMEN QUALITY: ADEQUATE

## 2017-11-24 LAB — BASIC METABOLIC PANEL
Anion gap: 7 (ref 5–15)
BUN: 32 mg/dL — ABNORMAL HIGH (ref 8–23)
CO2: 25 mmol/L (ref 22–32)
Calcium: 9.8 mg/dL (ref 8.9–10.3)
Chloride: 103 mmol/L (ref 98–111)
Creatinine, Ser: 1.72 mg/dL — ABNORMAL HIGH (ref 0.61–1.24)
GFR calc Af Amer: 46 mL/min — ABNORMAL LOW (ref >60)
GFR calc non Af Amer: 39 mL/min — ABNORMAL LOW (ref >60)
Glucose, Bld: 131 mg/dL — ABNORMAL HIGH (ref 70–99)
Potassium: 4.3 mmol/L (ref 3.5–5.1)
Sodium: 135 mmol/L (ref 135–145)

## 2017-11-24 LAB — SURGICAL PCR SCREEN
MRSA, PCR: NEGATIVE
STAPHYLOCOCCUS AUREUS: NEGATIVE

## 2017-11-24 SURGERY — IRRIGATION AND DEBRIDEMENT EXTREMITY
Anesthesia: General | Site: Foot | Laterality: Left

## 2017-11-24 MED ORDER — PHENYLEPHRINE 40 MCG/ML (10ML) SYRINGE FOR IV PUSH (FOR BLOOD PRESSURE SUPPORT)
PREFILLED_SYRINGE | INTRAVENOUS | Status: AC
  Filled 2017-11-24: qty 10

## 2017-11-24 MED ORDER — PROPOFOL 10 MG/ML IV BOLUS
INTRAVENOUS | Status: DC | PRN
  Administered 2017-11-24: 200 mg via INTRAVENOUS

## 2017-11-24 MED ORDER — PROPOFOL 10 MG/ML IV BOLUS
INTRAVENOUS | Status: AC
  Filled 2017-11-24: qty 20

## 2017-11-24 MED ORDER — MIDAZOLAM HCL 2 MG/2ML IJ SOLN
INTRAMUSCULAR | Status: DC | PRN
  Administered 2017-11-24: 1 mg via INTRAVENOUS

## 2017-11-24 MED ORDER — SODIUM CHLORIDE 0.9% FLUSH: 3.0000 mL | INTRAVENOUS | Status: DC | PRN

## 2017-11-24 MED ORDER — BUPIVACAINE HCL (PF) 0.5 % IJ SOLN
INTRAMUSCULAR | Status: DC | PRN
  Administered 2017-11-24: 10 mL

## 2017-11-24 MED ORDER — TOBRAMYCIN SULFATE 80 MG/2ML IJ SOLN
INTRAMUSCULAR | Status: AC
  Filled 2017-11-24: qty 2

## 2017-11-24 MED ORDER — LIDOCAINE 2% (20 MG/ML) 5 ML SYRINGE
INTRAMUSCULAR | Status: AC
  Filled 2017-11-24: qty 5

## 2017-11-24 MED ORDER — LACTATED RINGERS IV SOLN
INTRAVENOUS | Status: DC | PRN
  Administered 2017-11-24: 10:00:00 via INTRAVENOUS

## 2017-11-24 MED ORDER — TOBRAMYCIN SULFATE 1.2 G IJ SOLR
INTRAMUSCULAR | Status: AC
  Filled 2017-11-24: qty 1.2

## 2017-11-24 MED ORDER — SODIUM CHLORIDE 0.9 % IV SOLN: 250.0000 mL | INTRAVENOUS | Status: DC | PRN

## 2017-11-24 MED ORDER — TRAZODONE HCL 50 MG PO TABS: 25.0000 mg | ORAL_TABLET | Freq: Every evening | ORAL | Status: DC | PRN

## 2017-11-24 MED ORDER — VANCOMYCIN HCL 500 MG IV SOLR
INTRAVENOUS | Status: DC | PRN
  Administered 2017-11-24: 500 mg

## 2017-11-24 MED ORDER — ONDANSETRON HCL 4 MG/2ML IJ SOLN: 4.0000 mg | Freq: Once | INTRAMUSCULAR | Status: DC | PRN

## 2017-11-24 MED ORDER — ACETAMINOPHEN 325 MG PO TABS: 650.0000 mg | ORAL_TABLET | ORAL | Status: DC | PRN

## 2017-11-24 MED ORDER — HYDROCODONE-ACETAMINOPHEN 5-325 MG PO TABS: 1.0000 | ORAL_TABLET | ORAL | Status: DC | PRN

## 2017-11-24 MED ORDER — SODIUM CHLORIDE 0.9 % IR SOLN
Status: DC | PRN
  Administered 2017-11-24: 3000 mL

## 2017-11-24 MED ORDER — DEXAMETHASONE SODIUM PHOSPHATE 10 MG/ML IJ SOLN
INTRAMUSCULAR | Status: AC
  Filled 2017-11-24: qty 1

## 2017-11-24 MED ORDER — DEXAMETHASONE SODIUM PHOSPHATE 10 MG/ML IJ SOLN
INTRAMUSCULAR | Status: DC | PRN
  Administered 2017-11-24: 5 mg via INTRAVENOUS

## 2017-11-24 MED ORDER — LIDOCAINE HCL 2 % IJ SOLN
INTRAMUSCULAR | Status: DC | PRN
  Administered 2017-11-24: 10 mL

## 2017-11-24 MED ORDER — SODIUM CHLORIDE 0.9% FLUSH
3.0000 mL | Freq: Two times a day (BID) | INTRAVENOUS | Status: DC
  Administered 2017-11-24 – 2017-11-27 (×5): 3 mL via INTRAVENOUS

## 2017-11-24 MED ORDER — VANCOMYCIN HCL 10 G IV SOLR
1750.0000 mg | INTRAVENOUS | Status: DC
  Administered 2017-11-25 – 2017-11-27 (×3): 1750 mg via INTRAVENOUS
  Filled 2017-11-24 (×4): qty 1750

## 2017-11-24 MED ORDER — FENTANYL CITRATE (PF) 250 MCG/5ML IJ SOLN
INTRAMUSCULAR | Status: DC | PRN
  Administered 2017-11-24 (×2): 25 ug via INTRAVENOUS
  Administered 2017-11-24: 50 ug via INTRAVENOUS
  Administered 2017-11-24: 25 ug via INTRAVENOUS
  Administered 2017-11-24: 50 ug via INTRAVENOUS
  Administered 2017-11-24: 25 ug via INTRAVENOUS

## 2017-11-24 MED ORDER — BUPIVACAINE HCL (PF) 0.5 % IJ SOLN
INTRAMUSCULAR | Status: AC
  Filled 2017-11-24: qty 30

## 2017-11-24 MED ORDER — TOBRAMYCIN SULFATE 1.2 G IJ SOLR
INTRAMUSCULAR | Status: DC | PRN
  Administered 2017-11-24: 1.2 g

## 2017-11-24 MED ORDER — EPHEDRINE 5 MG/ML INJ
INTRAVENOUS | Status: AC
  Filled 2017-11-24: qty 10

## 2017-11-24 MED ORDER — OXYCODONE-ACETAMINOPHEN 5-325 MG PO TABS: 1.0000 | ORAL_TABLET | ORAL | Status: DC | PRN

## 2017-11-24 MED ORDER — FENTANYL CITRATE (PF) 250 MCG/5ML IJ SOLN
INTRAMUSCULAR | Status: AC
  Filled 2017-11-24: qty 5

## 2017-11-24 MED ORDER — ONDANSETRON HCL 4 MG/2ML IJ SOLN
INTRAMUSCULAR | Status: AC
  Filled 2017-11-24: qty 2

## 2017-11-24 MED ORDER — LIDOCAINE HCL 2 % IJ SOLN
INTRAMUSCULAR | Status: AC
  Filled 2017-11-24: qty 20

## 2017-11-24 MED ORDER — SODIUM CHLORIDE 0.9 % IV SOLN
INTRAVENOUS | Status: DC
  Administered 2017-11-24 – 2017-11-25 (×2): via INTRAVENOUS

## 2017-11-24 MED ORDER — 0.9 % SODIUM CHLORIDE (POUR BTL) OPTIME
TOPICAL | Status: DC | PRN
  Administered 2017-11-24: 1000 mL

## 2017-11-24 MED ORDER — LIDOCAINE HCL (PF) 1 % IJ SOLN
INTRAMUSCULAR | Status: AC
  Filled 2017-11-24: qty 30

## 2017-11-24 MED ORDER — SODIUM CHLORIDE 0.9 % IV SOLN
INTRAVENOUS | Status: DC
  Administered 2017-11-24 (×2): via INTRAVENOUS

## 2017-11-24 MED ORDER — MIDAZOLAM HCL 2 MG/2ML IJ SOLN
INTRAMUSCULAR | Status: AC
  Filled 2017-11-24: qty 2

## 2017-11-24 MED ORDER — TOBRAMYCIN SULFATE 80 MG/2ML IJ SOLN
INTRAMUSCULAR | Status: DC | PRN
  Administered 2017-11-24: 80 mg via INTRAMUSCULAR

## 2017-11-24 MED ORDER — FENTANYL CITRATE (PF) 100 MCG/2ML IJ SOLN: 25.0000 ug | INTRAMUSCULAR | Status: DC | PRN

## 2017-11-24 MED ORDER — ONDANSETRON HCL 4 MG/2ML IJ SOLN
INTRAMUSCULAR | Status: DC | PRN
  Administered 2017-11-24: 4 mg via INTRAVENOUS

## 2017-11-24 MED ORDER — VANCOMYCIN HCL 500 MG IV SOLR
INTRAVENOUS | Status: AC
  Filled 2017-11-24: qty 500

## 2017-11-24 MED ORDER — LIDOCAINE 2% (20 MG/ML) 5 ML SYRINGE
INTRAMUSCULAR | Status: DC | PRN
  Administered 2017-11-24: 100 mg via INTRAVENOUS

## 2017-11-24 MED ORDER — ALBUTEROL SULFATE (2.5 MG/3ML) 0.083% IN NEBU: 2.5000 mg | INHALATION_SOLUTION | Freq: Four times a day (QID) | RESPIRATORY_TRACT | Status: DC | PRN

## 2017-11-24 MED ORDER — PROMETHAZINE HCL 25 MG PO TABS: 12.5000 mg | ORAL_TABLET | ORAL | Status: DC | PRN

## 2017-11-24 SURGICAL SUPPLY — 59 items
BANDAGE ACE 4X5 VEL STRL LF (GAUZE/BANDAGES/DRESSINGS) IMPLANT
BANDAGE ELASTIC 4 VELCRO ST LF (GAUZE/BANDAGES/DRESSINGS) ×2 IMPLANT
BLADE SAW SGTL 83.5X18.5 (BLADE) ×3 IMPLANT
BLADE SURG 15 STRL LF DISP TIS (BLADE) ×3 IMPLANT
BLADE SURG 15 STRL SS (BLADE) ×6
BNDG COHESIVE 6X5 TAN STRL LF (GAUZE/BANDAGES/DRESSINGS) IMPLANT
BNDG ESMARK 4X9 LF (GAUZE/BANDAGES/DRESSINGS) ×3 IMPLANT
BNDG GAUZE ELAST 4 BULKY (GAUZE/BANDAGES/DRESSINGS) ×4 IMPLANT
BOWL SMART MIX CTS (DISPOSABLE) IMPLANT
CHLORAPREP W/TINT 26ML (MISCELLANEOUS) ×3 IMPLANT
COVER SURGICAL LIGHT HANDLE (MISCELLANEOUS) ×3 IMPLANT
CUFF TOURNIQUET SINGLE 18IN (TOURNIQUET CUFF) ×3 IMPLANT
CUFF TOURNIQUET SINGLE 34IN LL (TOURNIQUET CUFF) ×3 IMPLANT
DRAPE C-ARM MINI 42X72 WSTRAPS (DRAPES) ×3 IMPLANT
DRAPE U-SHAPE 47X51 STRL (DRAPES) ×3 IMPLANT
DRESSING ADAPTIC 1/2  N-ADH (PACKING) ×2 IMPLANT
DRSG PAD ABDOMINAL 8X10 ST (GAUZE/BANDAGES/DRESSINGS) ×3 IMPLANT
ELECT CAUTERY BLADE 6.4 (BLADE) ×3 IMPLANT
ELECT REM PT RETURN 9FT ADLT (ELECTROSURGICAL) ×3
ELECTRODE REM PT RTRN 9FT ADLT (ELECTROSURGICAL) IMPLANT
GAUZE PACKING IODOFORM 1/4X15 (GAUZE/BANDAGES/DRESSINGS) ×2 IMPLANT
GAUZE SPONGE 4X4 12PLY STRL (GAUZE/BANDAGES/DRESSINGS) ×3 IMPLANT
GAUZE SPONGE 4X4 16PLY XRAY LF (GAUZE/BANDAGES/DRESSINGS) ×2 IMPLANT
GAUZE XEROFORM 1X8 LF (GAUZE/BANDAGES/DRESSINGS) ×3 IMPLANT
GLOVE BIO SURGEON STRL SZ8 (GLOVE) ×3 IMPLANT
GLOVE BIOGEL PI IND STRL 8 (GLOVE) ×1 IMPLANT
GLOVE BIOGEL PI INDICATOR 8 (GLOVE) ×2
GOWN STRL REUS W/ TWL LRG LVL3 (GOWN DISPOSABLE) ×2 IMPLANT
GOWN STRL REUS W/TWL LRG LVL3 (GOWN DISPOSABLE) ×4
HANDPIECE INTERPULSE COAX TIP (DISPOSABLE)
KIT BASIN OR (CUSTOM PROCEDURE TRAY) ×3 IMPLANT
KIT STIMULAN RAPID CURE 5CC (Orthopedic Implant) ×2 IMPLANT
KIT TURNOVER KIT B (KITS) ×3 IMPLANT
MANIFOLD NEPTUNE II (INSTRUMENTS) ×3 IMPLANT
NDL BIOPSY 14X6 SOFT TISS (NEEDLE) IMPLANT
NDL HYPO 25GX1X1/2 BEV (NEEDLE) ×1 IMPLANT
NEEDLE BIOPSY 14X6 SOFT TISS (NEEDLE) ×3 IMPLANT
NEEDLE HYPO 25GX1X1/2 BEV (NEEDLE) ×3 IMPLANT
NS IRRIG 1000ML POUR BTL (IV SOLUTION) ×3 IMPLANT
PACK ORTHO EXTREMITY (CUSTOM PROCEDURE TRAY) ×3 IMPLANT
PAD ARMBOARD 7.5X6 YLW CONV (MISCELLANEOUS) ×6 IMPLANT
PAD CAST 4YDX4 CTTN HI CHSV (CAST SUPPLIES) ×1 IMPLANT
PADDING CAST COTTON 4X4 STRL (CAST SUPPLIES) ×2
PADDING CAST COTTON 6X4 STRL (CAST SUPPLIES) ×3 IMPLANT
SCRUB BETADINE 4OZ XXX (MISCELLANEOUS) ×3 IMPLANT
SET HNDPC FAN SPRY TIP SCT (DISPOSABLE) IMPLANT
SOL PREP POV-IOD 4OZ 10% (MISCELLANEOUS) ×3 IMPLANT
STAPLER VISISTAT 35W (STAPLE) ×5 IMPLANT
STOCKINETTE IMPERVIOUS 9X36 MD (GAUZE/BANDAGES/DRESSINGS) ×3 IMPLANT
SUT PROLENE 3 0 PS 2 (SUTURE) ×3 IMPLANT
SWAB CULTURE LIQ STUART DBL (MISCELLANEOUS) ×3 IMPLANT
SWAB CULTURE LIQUID MINI MALE (MISCELLANEOUS) ×3 IMPLANT
SYR CONTROL 10ML LL (SYRINGE) ×3 IMPLANT
TOWEL OR 17X24 6PK STRL BLUE (TOWEL DISPOSABLE) ×3 IMPLANT
TOWEL OR 17X26 10 PK STRL BLUE (TOWEL DISPOSABLE) ×3 IMPLANT
TUBE CONNECTING 12'X1/4 (SUCTIONS) ×1
TUBE CONNECTING 12X1/4 (SUCTIONS) ×2 IMPLANT
TUBING CYSTO DISP (UROLOGICAL SUPPLIES) ×2 IMPLANT
YANKAUER SUCT BULB TIP NO VENT (SUCTIONS) ×3 IMPLANT

## 2017-11-24 NOTE — Progress Notes (Signed)
Spoke with Dr Orvan Falconerampbell re PICC insertion order with pending blood cultures.  States he will see pt today and post a note if ok to proceed or to wait until 48 hr results.

## 2017-11-24 NOTE — Anesthesia Procedure Notes (Signed)
Procedure Name: LMA Insertion Date/Time: 11/24/2017 10:37 AM Performed by: Dairl PonderJiang, Kember Boch, CRNA Pre-anesthesia Checklist: Patient identified, Emergency Drugs available, Suction available, Patient being monitored and Timeout performed Patient Re-evaluated:Patient Re-evaluated prior to induction Oxygen Delivery Method: Circle system utilized Preoxygenation: Pre-oxygenation with 100% oxygen Induction Type: IV induction LMA: LMA inserted LMA Size: 5.0 Number of attempts: 1 Placement Confirmation: positive ETCO2 and breath sounds checked- equal and bilateral Tube secured with: Tape Dental Injury: Teeth and Oropharynx as per pre-operative assessment

## 2017-11-24 NOTE — Progress Notes (Signed)
Spoke with Marylene LandAngela RN re PICC ok to be placed per ID.  Will plan on placing PICC 11/25/17

## 2017-11-24 NOTE — Progress Notes (Signed)
Patient ID: Shirlean MylarMichael E Abelson, male   DOB: 1950/12/03, 67 y.o.   MRN: [id number]         [facility]  Date of Admission:  11/23/2017           Day 2 vancomycin ASSESSMENT: His operative Gram stain shows no organisms and cultures are pending.  Previous cultures grew MSSA.  He has a history of severe penicillin allergy.  I will continue vancomycin for now.  PLAN: 1. Continue vancomycin pending final cultures 2. PICC placement  Principal Problem:   Cellulitis and abscess of left foot Active Problems:   Severe obesity (BMI >= 40) (HCC)   OSA on CPAP   Essential hypertension   DM (diabetes mellitus), type 2, uncontrolled w/neurologic complication (HCC)   Unspecified atrial fibrillation (HCC)   Scheduled Meds: . cholecalciferol  4,000 Units Oral Daily  . fenofibrate  108 mg Oral Daily  . ibuprofen  400 mg Oral QHS  . insulin aspart  0-20 Units Subcutaneous TID WC  . insulin aspart  0-5 Units Subcutaneous QHS  . metoprolol tartrate  25 mg Oral BID  . sodium chloride flush  3 mL Intravenous Q12H   Continuous Infusions: . sodium chloride 10 mL/hr at 11/24/17 1332  . sodium chloride    . sodium chloride    . [START ON 11/25/2017] vancomycin     PRN Meds:.sodium chloride, acetaminophen, albuterol, HYDROcodone-acetaminophen, oxyCODONE-acetaminophen, promethazine, sodium chloride flush, traMADol, traZODone   SUBJECTIVE: Mr. Charyl DancerGant developed recurrent/persistent left foot infection about 1 week after completing 4 weeks of therapy with linezolid.  He developed increased swelling over the dorsum of his foot 1 week ago with what sounds like a large pustule.  His son popped the pustule and a large amount of pus drained out over the next 48 hours.  He was readmitted yesterday and started on vancomycin.  He underwent incision and drainage this morning.  It seems like there was not much residual pus but there was necrotic tissue.  2 screws were removed from his first  metatarsal.  Review of Systems: Review of Systems  Constitutional: Negative for chills, diaphoresis and fever.  Gastrointestinal: Negative for abdominal pain, diarrhea, nausea and vomiting.  Musculoskeletal: Positive for joint pain.    Allergies  Allergen Reactions  . Penicillins Anaphylaxis    Has patient had a PCN reaction causing immediate rash, facial/tongue/throat swelling, SOB or lightheadedness with hypotension: Yes Has patient had a PCN reaction causing severe rash involving mucus membranes or skin necrosis: Yes Has patient had a PCN reaction that required hospitalization Yes Has patient had a PCN reaction occurring within the last 10 years: No If all of the above answers are "NO", then may proceed with Cephalosporin use.  . Tetanus Toxoids Anaphylaxis, Shortness Of Breath and Swelling  . Sulfa Antibiotics Nausea And Vomiting and Swelling    OBJECTIVE: Vitals:   11/24/17 1215 11/24/17 1230 11/24/17 1245 11/24/17 1314  BP: 100/73 106/73 122/80 115/77  Pulse: (!) 58 (!) 55 (!) 57 (!) 51  Resp:  16 16 14   Temp:   (!) 97.5 F (36.4 C) 97.6 F (36.4 C)  TempSrc:    Oral  SpO2: 100% 100% 98% 97%  Weight:      Height:       Body mass index is 46.4 kg/m.  Physical Exam  Constitutional: He is oriented to person, place, and time.  He is resting quietly in bed.  His wife is at the bedside.  Musculoskeletal:  His right foot is dressed.  Neurological: He is alert and oriented to person, place, and time.  Skin: No rash noted.  Psychiatric: He has a normal mood and affect.    Lab Results Lab Results  Component Value Date   WBC 7.2 11/24/2017   HGB 14.6 11/24/2017   HCT 46.4 11/24/2017   MCV 91.7 11/24/2017   PLT 219 11/24/2017    Lab Results  Component Value Date   CREATININE 1.72 (H) 11/24/2017   BUN 32 (H) 11/24/2017   NA 135 11/24/2017   K 4.3 11/24/2017   CL 103 11/24/2017   CO2 25 11/24/2017    Lab Results  Component Value Date   ALT 14 11/23/2017    AST 23 11/23/2017   ALKPHOS 57 11/23/2017   BILITOT 1.0 11/23/2017     Microbiology: Recent Results (from the past 240 hour(s))  WOUND CULTURE     Status: None (Preliminary result)   Collection Time: 11/21/17  1:49 PM  Result Value Ref Range Status   MICRO NUMBER: [id number]  Preliminary   SPECIMEN QUALITY: ADEQUATE  Preliminary   SOURCE: NOT GIVEN  Preliminary   STATUS: PRELIMINARY  Preliminary   GRAM STAIN:   Preliminary    Rare Polymorphonuclear leukocytes Rare epithelial cells No organisms seen   RESULT: No Growth  Preliminary  Culture, blood (routine x 2)     Status: None (Preliminary result)   Collection Time: 11/23/17  4:06 PM  Result Value Ref Range Status   Specimen Description BLOOD LEFT ARM  Final   Special Requests   Final    BOTTLES DRAWN AEROBIC AND ANAEROBIC Blood Culture adequate volume   Culture   Final    NO GROWTH < 24 HOURS Performed at [facility], [address] [place], [place] [address]    Report Status PENDING  Incomplete  Culture, blood (routine x 2)     Status: None (Preliminary result)   Collection Time: 11/23/17  4:09 PM  Result Value Ref Range Status   Specimen Description BLOOD RIGHT HAND  Final   Special Requests   Final    BOTTLES DRAWN AEROBIC AND ANAEROBIC Blood Culture adequate volume   Culture   Final    NO GROWTH < 24 HOURS Performed at [facility], [address] [place], [place] [address]    Report Status PENDING  Incomplete  Surgical pcr screen     Status: None   Collection Time: 11/24/17  5:12 AM  Result Value Ref Range Status   MRSA, PCR NEGATIVE NEGATIVE Final   Staphylococcus aureus NEGATIVE NEGATIVE Final    Comment: (NOTE) The Xpert SA Assay (FDA approved for NASAL specimens in patients 36 years of age and older), is one component of a comprehensive surveillance program. It is not intended to diagnose infection nor to guide or monitor treatment. Performed at [facility], [address],  [place], [place] [address]   Aerobic/Anaerobic Culture (surgical/deep wound)     Status: None (Preliminary result)   Collection Time: 11/24/17 11:13 AM  Result Value Ref Range Status   Specimen Description ABSCESS  Final   Special Requests NONE  Final   Gram Stain   Final    NO WBC SEEN NO ORGANISMS SEEN Performed at [facility], [address], [place], [place] [address]    Culture PENDING  Incomplete   Report Status PENDING  Incomplete    Cliffton Asters, MD [facility]  [facility] [phone] pager   [phone] cell 11/24/2017, 3:35 PM

## 2017-11-24 NOTE — Transfer of Care (Signed)
Immediate Anesthesia Transfer of Care Note  Patient: William Cameron  Procedure(s) Performed: IRRIGATION AND DEBRIDEMENT LEFT FOOT, REMOVAL OF TWO SCREWS, INSERTION OF ANTIBIOTIC BEADS (Left Foot)  Patient Location: PACU  Anesthesia Type:General  Level of Consciousness: awake  Airway & Oxygen Therapy: Patient Spontanous Breathing and Patient connected to nasal cannula oxygen  Post-op Assessment: Report given to RN and Post -op Vital signs reviewed and stable  Post vital signs: Reviewed and stable  Last Vitals:  Vitals Value Taken Time  BP    Temp    Pulse    Resp    SpO2      Last Pain:  Vitals:   11/24/17 0526  TempSrc: Oral  PainSc:          Complications: No apparent anesthesia complications

## 2017-11-24 NOTE — Interval H&P Note (Signed)
History and Physical Interval Note:  11/24/2017 12:09 PM  William Cameron  has presented today for surgery, with the diagnosis of Left foot abscess  The various methods of treatment have been discussed with the patient and family. After consideration of risks, benefits and other options for treatment, the patient has consented to  Procedure(s): IRRIGATION AND DEBRIDEMENT LEFT FOOT, REMOVAL OF TWO SCREWS, INSERTION OF ANTIBIOTIC BEADS (Left) as a surgical intervention .  The patient's history has been reviewed, patient examined, no change in status, stable for surgery.  I have reviewed the patient's chart and labs.  Questions were answered to the patient's satisfaction.     Felecia ShellingBrent M Evans

## 2017-11-24 NOTE — Anesthesia Preprocedure Evaluation (Signed)
Anesthesia Evaluation  Patient identified by MRN, date of birth, ID band Patient awake    Reviewed: Allergy & Precautions, NPO status , Patient's Chart, lab work & pertinent test results, reviewed documented beta blocker date and time   History of Anesthesia Complications (+) PONV and history of anesthetic complications  Airway Mallampati: II  TM Distance: >3 FB Neck ROM: Full    Dental  (+) Dental Advisory Given, Missing, Caps,    Pulmonary sleep apnea and Continuous Positive Airway Pressure Ventilation , COPD,    Pulmonary exam normal breath sounds clear to auscultation       Cardiovascular hypertension, Pt. on medications and Pt. on home beta blockers + dysrhythmias Atrial Fibrillation  Rhythm:Irregular Rate:Abnormal     Neuro/Psych CVA, No Residual Symptoms negative psych ROS   GI/Hepatic negative GI ROS, Neg liver ROS,   Endo/Other  diabetes, Type 2, Oral Hypoglycemic AgentsMorbid obesity  Renal/GU Renal InsufficiencyRenal disease     Musculoskeletal negative musculoskeletal ROS (+)   Abdominal   Peds  Hematology  (+) Blood dyscrasia (Eliquis), ,   Anesthesia Other Findings Day of surgery medications reviewed with the patient.  Reproductive/Obstetrics                             Anesthesia Physical Anesthesia Plan  ASA: IV  Anesthesia Plan: General   Post-op Pain Management:    Induction: Intravenous  PONV Risk Score and Plan: 3 and Ondansetron and Treatment may vary due to age or medical condition  Airway Management Planned: LMA  Additional Equipment:   Intra-op Plan:   Post-operative Plan: Extubation in OR  Informed Consent: I have reviewed the patients History and Physical, chart, labs and discussed the procedure including the risks, benefits and alternatives for the proposed anesthesia with the patient or authorized representative who has indicated his/her  understanding and acceptance.   Dental advisory given  Plan Discussed with: CRNA  Anesthesia Plan Comments:         Anesthesia Quick Evaluation

## 2017-11-24 NOTE — Anesthesia Postprocedure Evaluation (Signed)
Anesthesia Post Note  Patient: Shirlean MylarMichael E Whittingham  Procedure(s) Performed: IRRIGATION AND DEBRIDEMENT LEFT FOOT, REMOVAL OF TWO SCREWS, INSERTION OF ANTIBIOTIC BEADS (Left Foot)     Patient location during evaluation: PACU Anesthesia Type: General Level of consciousness: awake and alert, awake and oriented Pain management: pain level controlled Vital Signs Assessment: post-procedure vital signs reviewed and stable Respiratory status: spontaneous breathing, nonlabored ventilation and respiratory function stable Cardiovascular status: blood pressure returned to baseline and stable Postop Assessment: no apparent nausea or vomiting Anesthetic complications: no    Last Vitals:  Vitals:   11/24/17 1314 11/24/17 1804  BP: 115/77 138/79  Pulse: (!) 51 66  Resp: 14 18  Temp: 36.4 C 36.8 C  SpO2: 97% 98%    Last Pain:  Vitals:   11/24/17 1804  TempSrc: Oral  PainSc:                  Cecile HearingStephen Edward Turk

## 2017-11-24 NOTE — Progress Notes (Signed)
Pharmacy Antibiotic Note  William MylarMichael E Cameron is a 67 y.o. male admitted on 11/23/2017 with wound infection.  Pharmacy has been consulted for vanc dosing.  William NeedleMichael is a status post bunionectomy on 09/06/17. He noticed some abscess to this incision site about a week ago. He is admitted today for an I&D. His previous culture grew out MRSA that is resistant to clinda and doxy. Vanc is ordered to be started today.   SCr up at 1.72 with normalized CrCl ~42 mL/min. UOP not recorded but 4 occurrences. WBC is within normal limits. Afebrile.   Plan: Reduce Vancomycin to 1.75g IV every 24 hours  Monitor renal function, culture results, and clinical status.  Trough PRN  Height: 6' (182.9 cm) Weight: (!) 342 lb 2.5 oz (155.2 kg) IBW/kg (Calculated) : 77.6  Temp (24hrs), Avg:97.6 F (36.4 C), Min:97 F (36.1 C), Max:98.1 F (36.7 C)  Recent Labs  Lab 11/23/17 1606 11/24/17 0443  WBC 8.2 7.2  CREATININE 1.68* 1.72*    Estimated Creatinine Clearance: 64 mL/min (A) (by C-G formula based on SCr of 1.72 mg/dL (H)).    Allergies  Allergen Reactions  . Penicillins Anaphylaxis    Has patient had a PCN reaction causing immediate rash, facial/tongue/throat swelling, SOB or lightheadedness with hypotension: Yes Has patient had a PCN reaction causing severe rash involving mucus membranes or skin necrosis: Yes Has patient had a PCN reaction that required hospitalization Yes Has patient had a PCN reaction occurring within the last 10 years: No If all of the above answers are "NO", then may proceed with Cephalosporin use.  . Tetanus Toxoids Anaphylaxis, Shortness Of Breath and Swelling  . Sulfa Antibiotics Nausea And Vomiting and Swelling    Antimicrobials this admission: 8/30 vanc >>   Dose adjustments this admission: 8/31 Vanc adjusted from 1.5g IV q12 to 1.75g IV q24 due to worsening renal function  Microbiology results: 8/30 BCx:  UCx:  Abscess: MRSA PCR: negative  Link SnufferJessica Jamear Carbonneau, PharmD,  BCPS, BCCCP Clinical Pharmacist Clinical phone 11/24/2017 until 3:30PM - #[phone] Please refer to [facility] for [facility] numbers 11/24/2017 1:39 PM

## 2017-11-24 NOTE — Op Note (Signed)
OPERATIVE REPORT Patient name: William Cameron MRN: [id number] DOB: 1950-04-15  DOS:  11/24/2017  Preop Dx: Cellulitis with abscess left foot.  Symptomatic orthopedic screws x2 left foot.  Possible osteomyelitis left foot. Postop Dx: same  Procedure:  1.  Incision and drainage left foot 2.  Removal of orthopedic screws x2 left foot 3.  Bone biopsy left foot 4.  Placement of antibiotic beads left foot  Surgeon: Felecia ShellingBrent M. Denham Mose DPM  Anesthesia: General anesthesia  Hemostasis: Ankle tourniquet inflated to a pressure of 250mmHg without Esmarch exsanguination   EBL: 10 mL Materials: Stimulan absorbable antibiotic beads mixed with a combination of 500mg  Vancomycin and 600mg  Tobramycin both in powder form  Injectables: 10cc 0.5% marcaine plain  Pathology: Deep wound cultures sent for aerobic, anearobic, and gram stain. Core bone biopsy 1st metatarsal left foot for gross and microscopic exam  Condition: The patient tolerated the procedure and anesthesia well. No complications noted or reported  Justification for procedure: The patient is a 67 y.o. male h/o DM2, morbid obesity, HTN who presents today for surgical correction of abscess with cellulitis left foot. All conservative modalities of been unsuccessful in providing any sort of satisfactory alleviation of symptoms with the patient. The patient was told benefits as well as possible side effects of the surgery. The patient consented for surgical correction. The patient consent form was reviewed. All patient questions were answered. No guarantees were expressed or implied. The patient and the surgeon boson the patient consent form with the witness present and placed in the patient's chart.   Procedure in Detail: The patient was brought to the operating room, placed in the operating table in the supine position at which time an aseptic scrub and drape were performed about the patient's respective lower extremity after anesthesia was induced  as described above. Attention was then directed to the surgical area where procedure number one commenced.  Procedure #1: Incision and drainage left foot A 4 cm linear longitudinal skin incision was planned and made overlying the dorsal medial aspect of the left foot.  Incision carried down to the level of bone with care taken to cut clamp ligate to retract well small neurovascular structures traversing the incision site.  Any purulent drainage, which was minimal, was expressed from the incision site.  All devitalized necrotic tissue was also sharply dissected down to healthy bleeding viable tissue.  Please note there is no appreciable areas of deep abscess.  Minimal purulent drainage.  Within the incision site and wound exploration consisted of more necrotic nonviable tissue rather than purulent drainage.  Deep wound cultures were taken at this time and sent to pathology for culture and sensitivity.  The incision site was copiously irrigated with normal saline in preparation for the following procedure.  Procedure #2: Removal of orthopedic screws x2 left foot Within the incision site previously mentioned procedure #1 the 2 orthopedic screws utilized for prior surgery were visualized within the incision site.  Screws were removed in toto and placed in sterile specimen container.  Intraoperative x-ray fluoroscopy was utilized to visualize the complete removal of the screws.  Copious irrigation was again utilized in preparation for bone biopsy.  Procedure #3: Bone biopsy first metatarsal left foot Within the incision site previously mentioned procedure #1 a core needle biopsy approximately 2 mm in diameter was resected from the distal head of the first metatarsal.  There is also fragment of bone which was freely floating within the incision site which was also resected.  The  core bone biopsy as well as the freely floating fragment of bone cortex was placed in sterile specimen container and sent to pathology  for gross microscopic exam.  Procedure #4: Placement of antibiotic beads left first metatarsal Stimulan biocomposite absorbable antibiotic beads were mixed with combination of 500 mg vancomycin and 600 mg tobramycin powders.  Beads were then packed within the cancellus bone void of the first metatarsal.  Antibiotic beads were prepped in the standard fashion.  After beads were placed within the cancellus void of the first metatarsal the incision site was primarily closed using stainless steel skin staples.  Quarter inch iodoform packing was placed within the central portion of the incision site.  Dry sterile compressive dressings were then applied to all previously mentioned incision sites about the patient's lower extremity. The tourniquet which was used for hemostasis was deflated. All normal neurovascular responses including pink color and warmth returned all the digits of patient's lower extremity.  The patient was then transferred from the operating room to the recovery room having tolerated the procedure and anesthesia well. All vital signs are stable. After a brief stay in the recovery room the patient was readmitted to inpatient room with adequate prescriptions for analgesia.  Postsurgical plan is to leave the dressings clean dry and intact for the next 48 hours while cultures are pending.  Patient will likely need PICC line placement with long-term IV antibiotics.  Planning follow-up visit on Monday afternoon, 11/26/2017, for dressing change prior to discharge.  Until then keep dressings clean dry and intact.  Reinforce as needed.  Felecia Shelling, DPM [facility]  Dr. Felecia Shelling, DPM    [address]                                        [place], [place] [address]                Office [phone]  Fax [phone]

## 2017-11-24 NOTE — Progress Notes (Signed)
Patient arrived to 6n10 A&Ox4, VSS, LFA IV intact and infusing.  Noted to have compression wrap on left foot and ankle.  Patient actively moving toes.  Complains of no pain at this time.  Wife at bedside.  Will continue to monitor.

## 2017-11-24 NOTE — Brief Op Note (Signed)
11/24/2017  12:06 PM  PATIENT:  William Cameron  67 y.o. male  PRE-OPERATIVE DIAGNOSIS:  Left foot abscess  POST-OPERATIVE DIAGNOSIS:  Left foot abscess  PROCEDURE:  Procedure(s): IRRIGATION AND DEBRIDEMENT LEFT FOOT, REMOVAL OF TWO SCREWS, INSERTION OF ANTIBIOTIC BEADS (Left)  SURGEON:  Surgeon(s) and Role:    Felecia Shelling* Evans, Brent M, DPM - Primary  PHYSICIAN ASSISTANT:   ASSISTANTS: none   ANESTHESIA:   general  EBL:  minimal   BLOOD ADMINISTERED:none  DRAINS: none   LOCAL MEDICATIONS USED:  MARCAINE     SPECIMEN:  Core Needle Biopsy  DISPOSITION OF SPECIMEN:  PATHOLOGY  COUNTS:  YES  TOURNIQUET:   Total Tourniquet Time Documented: Thigh (Left) - 45 minutes Total: Thigh (Left) - 45 minutes   DICTATION: .Reubin Milanragon Dictation  PLAN OF CARE: Inpatient  PATIENT DISPOSITION:  PACU - hemodynamically stable.   Delay start of Pharmacological VTE agent (>24hrs) due to surgical blood loss or risk of bleeding: no

## 2017-11-24 NOTE — Progress Notes (Addendum)
PROGRESS NOTE    William Cameron  ZOX:[id number] DOB: [birth year] DOA: 11/23/2017 PCP: Ralene Ok, MD  Brief Narrative:  The patient is a 67 y.o. male with medical history significant for L foot cellulitis/abscess, uncontrolled DM2, CVA, OSA, HTN, morbid obesity who underwent L foot surgery in mid June by Dr. Logan Bores (podiatry) who subsequently developed an abscess of the incision site which his son drained with a sterilized needle. Afterwards swelling and erythema improved and he took some leftover levaquin 750 mg BID. He presented to Dr. Logan Bores' office on 8/28 and cultures were drawn which ultimately grew MRSA. Plan was to remove screw on 9/11 with debridement and his LVQ was refilled; however, pt felt foot wound was getting worse, with increased pain and swelling and erythema. There aws serous fluid now draining from the top of his foot. He has had no documented fevers, no night sweats but overall has felt worse and has had increased weakness. Cultures from 8/28 were negative. He was directly admitted with the plan of getting a PICC and IV vanc as well as more immediate surgical treatment and patient was taken to surgery today by Dr. Logan Bores. Dr. Orvan Falconer of ID to evaluate the patient per notes to ok immediate PICC insertion vs. Waiting 48 hours.   Assessment & Plan:   Principal Problem:   Cellulitis and abscess of left foot Active Problems:   Severe obesity (BMI >= 40) (HCC)   OSA on CPAP   Essential hypertension   DM (diabetes mellitus), type 2, uncontrolled w/neurologic complication (HCC)   Unspecified atrial fibrillation (HCC)  Cellulitis L foot, r/o OM L foot -s/p bunion and hammertoe surgery September 06 2017 -Admitted to med surg -BCx x 2 pending -vancomycin per ID recs -PICC ordered but Dr. Orvan Falconer of ID to evaluate the patient today and will notify if ok to proceed with PICC today or wati 48 hours -Checked CRP and was <0.8 -Continue monitor CBC, temp, vitals -Was NPO after MN for I  and D, removal of screws, bone bx L foot tomorrow morning. Plan is also to place antibiotic beads; Patient is POD 1 -F/u blood cultures, operative cultures -Appreciate podiatry and ID assistance -Hold Eliquis until after surgery and until ok to resume per Podiatry  -Patient is Afebrile and has no Leukocytosis  -Follow up on Podiatry and ID Recc's  DM2 -Not on home insulin -hold home oral hypoglycemics -Resistant [employer] SSI AC/HS -Last HbA1c was 7.6 -carb consistent diet after Surgery  Essential HTN -pt states his BP has been lower than usual for past 2 weeks; it is on the lower side here -Hold amlodipine and benazepril for now -Continue metoprolol given h/o AF; will change from long-acting to short acting in case BP drops  H/o AF, on chronic anticoagulation -cont BB (changes as above) -hold Eliquis for surgery  OSA -cont CPAP per home settings  AKI on CKD Stage 3 -Baseline CR ranging from 1.3-1.5 -Today Cr was 1.72 -Start Gentle Fluid Hydration with NS at a rate of 75 mL/hr x 20 hours -Avoid Nephrotoxic Medications if possible (Will be on Vancomycin per ID notes) and will hold Benazepril -Continue to Monitor and Trend CMP  COPD/Chronic Bronchitis -Currently not in Exacerbation -Added Albuterol 2.5 mg Neb q6hprn Wheezing and SOB  HLD -C/w Fenofibrate 108 mg po Daily   Severe Morbid Obesity -Estimated body mass index is 46.4 kg/m as calculated from the following:   Height as of this encounter: 6' (1.829 m).   Weight as of  this encounter: 155.2 kg.  -Weight Loss Counseling Given   DVT prophylaxis: SCDs, Anticoagulated with Eliquis and resume when ok with Podiatry  Code Status: FULL CODE Family Communication: Discussed with wife at bedside Disposition Plan: Anticipated D/C Home when medically stable and cleared by ID and Podiatry  Consultants:   Podiatry    Procedures:  -Done by Dr. Logan BoresEvans 11/24/17 1. Incision and drainage left foot 2.  Removal of  orthopedic screws x2 left foot 3.  Bone biopsy left foot 4.  Placement of antibiotic beads left foot  Antimicrobials:  Anti-infectives (From admission, onward)   Start     Dose/Rate Route Frequency Ordered Stop   11/24/17 0600  vancomycin (VANCOCIN) 1,500 mg in sodium chloride 0.9 % 500 mL IVPB     1,500 mg 250 mL/hr over 120 Minutes Intravenous Every 12 hours 11/23/17 1539     11/23/17 1545  vancomycin (VANCOCIN) 2,000 mg in sodium chloride 0.9 % 500 mL IVPB     2,000 mg 250 mL/hr over 120 Minutes Intravenous  Once 11/23/17 1539 11/23/17 2244     Subjective: Seen and Examined at bedside and states he is very frustrated with the course of events that have been since June.  No lightheadedness or dizziness.  No chest pain or shortness breath.  States that he has been more fatigued and states that his activities of daily living have been severely hindered due to his foot.  States foot is painful when he ambulates on it and has had more swelling and warmth.  States that he feels it is gotten worse.  No other concerns or complaints at this time.  Wife is very frustrated and wanting to talk with Podiatry.  Objective: Vitals:   11/23/17 1505 11/23/17 2106 11/24/17 0526  BP: 96/62 96/63 105/72  Pulse: 73 (!) 102 60  Resp: 18  16  Temp: 98.1 F (36.7 C) 97.7 F (36.5 C) (!) 97.4 F (36.3 C)  TempSrc: Oral Oral Oral  SpO2: 97% 99% 100%  Weight: (!) 155.2 kg    Height: 6' (1.829 m)      Intake/Output Summary (Last 24 hours) at 11/24/2017 0802 Last data filed at 11/23/2017 1900 Gross per 24 hour  Intake 240 ml  Output -  Net 240 ml   Filed Weights   11/23/17 1505  Weight: (!) 155.2 kg   Examination: Physical Exam:  Constitutional: WN/WD morbidly obese Cacuasian male inNAD and appears calm but uncomfortable Eyes: Lids and conjunctivae normal, sclerae anicteric  ENMT: External Ears, Nose appear normal. Grossly normal hearing. Mucous membranes are moist.  Neck: Appears normal,  supple, no cervical masses, normal ROM, no appreciable thyromegaly, no JVD Respiratory: Diminished to auscultation bilaterally, no wheezing, rales, rhonchi or crackles. Normal respiratory effort and patient is not tachypenic. No accessory muscle use.  Cardiovascular: RRR, no murmurs / rubs / gallops. S1 and S2 auscultated. Has some Left foot pedal extremity edema.  Abdomen: Soft, Non-tender, Distended significantly due to body habitus. No masses palpated. No appreciable hepatosplenomegaly. Bowel sounds positive x4.  GU: Deferred. Musculoskeletal: No clubbing / cyanosis of digits/nails. Left foot swollen  Skin: Left foot significantly swollen and warm and painful to palpate. No induration; Warm and dry.  Neurologic: CN 2-12 grossly intact with no focal deficits. Romberg sign and cerebellar reflexes not assessed.  Psychiatric: Normal judgment and insight. Alert and oriented x 3. Agitated mood and appropriate affect.   Data Reviewed: I have personally reviewed following labs and imaging studies  CBC: Recent  Labs  Lab 11/23/17 1606 11/24/17 0443  WBC 8.2 7.2  NEUTROABS 5.4  --   HGB 14.8 14.6  HCT 45.7 46.4  MCV 90.7 91.7  PLT 239 219   Basic Metabolic Panel: Recent Labs  Lab 11/23/17 1606 11/24/17 0443  NA 136 135  K 4.0 4.3  CL 103 103  CO2 22 25  GLUCOSE 143* 131*  BUN 36* 32*  CREATININE 1.68* 1.72*  CALCIUM 10.0 9.8   GFR: Estimated Creatinine Clearance: 64 mL/min (A) (by C-G formula based on SCr of 1.72 mg/dL (H)). Liver Function Tests: Recent Labs  Lab 11/23/17 1606  AST 23  ALT 14  ALKPHOS 57  BILITOT 1.0  PROT 7.5  ALBUMIN 4.1   No results for input(s): LIPASE, AMYLASE in the last 168 hours. No results for input(s): AMMONIA in the last 168 hours. Coagulation Profile: No results for input(s): INR, PROTIME in the last 168 hours. Cardiac Enzymes: No results for input(s): CKTOTAL, CKMB, CKMBINDEX, TROPONINI in the last 168 hours. BNP (last 3 results) No  results for input(s): PROBNP in the last 8760 hours. HbA1C: No results for input(s): HGBA1C in the last 72 hours. CBG: Recent Labs  Lab 11/23/17 1600 11/23/17 2102  GLUCAP 134* 128*   Lipid Profile: No results for input(s): CHOL, HDL, LDLCALC, TRIG, CHOLHDL, LDLDIRECT in the last 72 hours. Thyroid Function Tests: No results for input(s): TSH, T4TOTAL, FREET4, T3FREE, THYROIDAB in the last 72 hours. Anemia Panel: No results for input(s): VITAMINB12, FOLATE, FERRITIN, TIBC, IRON, RETICCTPCT in the last 72 hours. Sepsis Labs: No results for input(s): PROCALCITON, LATICACIDVEN in the last 168 hours.  Recent Results (from the past 240 hour(s))  WOUND CULTURE     Status: None (Preliminary result)   Collection Time: 11/21/17  1:49 PM  Result Value Ref Range Status   MICRO NUMBER: [id number]  Preliminary   SPECIMEN QUALITY: ADEQUATE  Preliminary   SOURCE: NOT GIVEN  Preliminary   STATUS: PRELIMINARY  Preliminary   GRAM STAIN:   Preliminary    Rare Polymorphonuclear leukocytes Rare epithelial cells No organisms seen   RESULT: No Growth  Preliminary  Surgical pcr screen     Status: None   Collection Time: 11/24/17  5:12 AM  Result Value Ref Range Status   MRSA, PCR NEGATIVE NEGATIVE Final   Staphylococcus aureus NEGATIVE NEGATIVE Final    Comment: (NOTE) The Xpert SA Assay (FDA approved for NASAL specimens in patients 57 years of age and older), is one component of a comprehensive surveillance program. It is not intended to diagnose infection nor to guide or monitor treatment. Performed at [facility], [address] [place], [place] [address]     Radiology Studies: [place] Ekg Site Rite  Result Date: 11/23/2017 If [facility] image not attached, placement could not be confirmed due to current cardiac rhythm.  Scheduled Meds: . cholecalciferol  4,000 Units Oral Daily  . fenofibrate  108 mg Oral Daily  . ibuprofen  400 mg Oral QHS  . insulin aspart  0-20 Units Subcutaneous  TID WC  . insulin aspart  0-5 Units Subcutaneous QHS  . metoprolol tartrate  25 mg Oral BID  . povidone-iodine  1 application Topical Once   Continuous Infusions: . vancomycin 1,500 mg (11/24/17 0506)    LOS: 1 day    Merlene Laughter, DO [facility] PAGER is on AMION  If 7PM-7AM, please contact night-coverage [email or web address] Password [email or web address] 11/24/2017, 8:02 AM

## 2017-11-24 NOTE — Progress Notes (Signed)
Patient transported to short stay.  Report given to ParnellRobbie, Charity fundraiserN.

## 2017-11-25 ENCOUNTER — Inpatient Hospital Stay: Payer: Self-pay

## 2017-11-25 ENCOUNTER — Inpatient Hospital Stay (HOSPITAL_COMMUNITY): Payer: Medicare HMO

## 2017-11-25 LAB — COMPREHENSIVE METABOLIC PANEL
ALBUMIN: 3.4 g/dL — AB (ref 3.5–5.0)
ALK PHOS: 51 U/L (ref 38–126)
ALT: 14 U/L (ref 0–44)
ANION GAP: 7 (ref 5–15)
AST: 17 U/L (ref 15–41)
BILIRUBIN TOTAL: 0.8 mg/dL (ref 0.3–1.2)
BUN: 26 mg/dL — ABNORMAL HIGH (ref 8–23)
CALCIUM: 8.9 mg/dL (ref 8.9–10.3)
CO2: 22 mmol/L (ref 22–32)
Chloride: 106 mmol/L (ref 98–111)
Creatinine, Ser: 1.54 mg/dL — ABNORMAL HIGH (ref 0.61–1.24)
GFR, EST AFRICAN AMERICAN: 52 mL/min — AB (ref >60)
GFR, EST NON AFRICAN AMERICAN: 45 mL/min — AB (ref >60)
Glucose, Bld: 176 mg/dL — ABNORMAL HIGH (ref 70–99)
POTASSIUM: 4.6 mmol/L (ref 3.5–5.1)
Sodium: 135 mmol/L (ref 135–145)
TOTAL PROTEIN: 6.4 g/dL — AB (ref 6.5–8.1)

## 2017-11-25 LAB — CBC WITH DIFFERENTIAL/PLATELET
Abs Immature Granulocytes: 0.1 10*3/uL (ref 0.0–0.1)
BASOS ABS: 0 10*3/uL (ref 0.0–0.1)
Basophils Relative: 0 %
EOS ABS: 0 10*3/uL (ref 0.0–0.7)
EOS PCT: 0 %
HEMATOCRIT: 39.5 % (ref 39.0–52.0)
HEMOGLOBIN: 12.4 g/dL — AB (ref 13.0–17.0)
Immature Granulocytes: 1 %
LYMPHS ABS: 1.1 10*3/uL (ref 0.7–4.0)
LYMPHS PCT: 12 %
MCH: 28.7 pg (ref 26.0–34.0)
MCHC: 31.4 g/dL (ref 30.0–36.0)
MCV: 91.4 fL (ref 78.0–100.0)
Monocytes Absolute: 0.7 10*3/uL (ref 0.1–1.0)
Monocytes Relative: 8 %
Neutro Abs: 7.5 10*3/uL (ref 1.7–7.7)
Neutrophils Relative %: 79 %
Platelets: 199 10*3/uL (ref 150–400)
RBC: 4.32 MIL/uL (ref 4.22–5.81)
RDW: 14.6 % (ref 11.5–15.5)
WBC: 9.5 10*3/uL (ref 4.0–10.5)

## 2017-11-25 LAB — GLUCOSE, CAPILLARY
GLUCOSE-CAPILLARY: 162 mg/dL — AB (ref 70–99)
GLUCOSE-CAPILLARY: 183 mg/dL — AB (ref 70–99)
Glucose-Capillary: 178 mg/dL — ABNORMAL HIGH (ref 70–99)

## 2017-11-25 LAB — PHOSPHORUS: Phosphorus: 3.1 mg/dL (ref 2.5–4.6)

## 2017-11-25 LAB — MAGNESIUM: MAGNESIUM: 2.1 mg/dL (ref 1.7–2.4)

## 2017-11-25 MED ORDER — APIXABAN 5 MG PO TABS
5.0000 mg | ORAL_TABLET | Freq: Two times a day (BID) | ORAL | Status: DC
  Administered 2017-11-25 – 2017-11-27 (×4): 5 mg via ORAL
  Filled 2017-11-25 (×4): qty 1

## 2017-11-25 MED ORDER — SODIUM CHLORIDE 0.9% FLUSH: 10.0000 mL | INTRAVENOUS | Status: DC | PRN

## 2017-11-25 MED ORDER — SODIUM CHLORIDE 0.9 % IV SOLN
INTRAVENOUS | Status: AC
  Administered 2017-11-25 – 2017-11-26 (×2): via INTRAVENOUS

## 2017-11-25 NOTE — Progress Notes (Signed)
Spoke with Marylene Land RN re PICC placement to be placed today.  States not scheduled for d/c home and current PIV working well.

## 2017-11-25 NOTE — Progress Notes (Signed)
ANTICOAGULATION CONSULT NOTE  Pharmacy Consult for apixaban Indication: atrial fibrillation  Allergies  Allergen Reactions  . Penicillins Anaphylaxis    Has patient had a PCN reaction causing immediate rash, facial/tongue/throat swelling, SOB or lightheadedness with hypotension: Yes Has patient had a PCN reaction causing severe rash involving mucus membranes or skin necrosis: Yes Has patient had a PCN reaction that required hospitalization Yes Has patient had a PCN reaction occurring within the last 10 years: No If all of the above answers are "NO", then may proceed with Cephalosporin use.  . Tetanus Toxoids Anaphylaxis, Shortness Of Breath and Swelling  . Sulfa Antibiotics Nausea And Vomiting and Swelling    Patient Measurements: Height: 6' (182.9 cm) Weight: (!) 342 lb 2.5 oz (155.2 kg) IBW/kg (Calculated) : 77.6  Vital Signs: Temp: 98 F (36.7 C) (09/01 1125) Temp Source: Oral (09/01 1125) BP: 120/78 (09/01 1138) Pulse Rate: 48 (09/01 1138)  Labs: Recent Labs    11/23/17 1606 11/24/17 0443 11/25/17 0552  HGB 14.8 14.6 12.4*  HCT 45.7 46.4 39.5  PLT 239 219 199  CREATININE 1.68* 1.72* 1.54*    Estimated Creatinine Clearance: 71.5 mL/min (A) (by C-G formula based on SCr of 1.54 mg/dL (H)).   Medical History: Past Medical History:  Diagnosis Date  . Bronchitis    chronic bronchitis  . COPD (chronic obstructive pulmonary disease) (HCC)   . Diabetes mellitus without complication (HCC)   . Elevated lactic acid level   . Hypertension   . PONV (postoperative nausea and vomiting)    post op nausea  . 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   . Sleep apnea    cpap at night    Assessment: 67 yom to resume apixaban for history of afib. Hgb slightly low and platelets are WNL. No bleeding noted.   Goal of Therapy:  Stroke prevention Monitor  platelets by anticoagulation protocol: Yes   Plan:  Apixaban 5mg  PO BID F/u renal fxn, S&S of bleeding  Christan Defranco, Drake Leach 11/25/2017,4:03 PM

## 2017-11-25 NOTE — Progress Notes (Signed)
Peripherally Inserted Central Catheter/Midline Placement  The IV Nurse has discussed with the patient and/or persons authorized to consent for the patient, the purpose of this procedure and the potential benefits and risks involved with this procedure.  The benefits include less needle sticks, lab draws from the catheter, and the patient may be discharged home with the catheter. Risks include, but not limited to, infection, bleeding, blood clot (thrombus formation), and puncture of an artery; nerve damage and irregular heartbeat and possibility to perform a PICC exchange if needed/ordered by physician.  Alternatives to this procedure were also discussed.  Bard Power PICC patient education guide, fact sheet on infection prevention and patient information card has been provided to patient /or left at bedside.    PICC/Midline Placement Documentation  PICC Single Lumen 11/25/17 PICC Right Cephalic 41 cm 0 cm (Active)  Indication for Insertion or Continuance of Line Home intravenous therapies (PICC only) 11/25/2017  2:15 PM  Exposed Catheter (cm) 0 cm 11/25/2017  2:15 PM  Site Assessment Clean;Intact;Dry 11/25/2017  2:15 PM  Line Status Flushed;Saline locked;Blood return noted 11/25/2017  2:15 PM  Dressing Type Transparent 11/25/2017  2:15 PM  Dressing Status Clean;Dry;Intact;Antimicrobial disc in place 11/25/2017  2:15 PM  Line Care Connections checked and tightened 11/25/2017  2:15 PM  Line Adjustment (NICU/IV Team Only) No 11/25/2017  2:15 PM  Dressing Intervention New dressing 11/25/2017  2:15 PM  Dressing Change Due 12/02/17 11/25/2017  2:15 PM       Elliot Dally 11/25/2017, 2:15 PM

## 2017-11-25 NOTE — Progress Notes (Addendum)
Spoke with Dr Marland Mcalpine re PICC placement.  New orders obtained.  RN notified of malposition.

## 2017-11-25 NOTE — Progress Notes (Signed)
Patient ID: William Cameron, male   DOB: 05-Oct-1950, 67 y.o.   MRN: [id number]         [facility]  Date of Admission:  11/23/2017           Day 3 vancomycin ASSESSMENT: He is feeling much better today.  Pain in his left foot has decreased substantially and he feels like the swelling of his toes is decreasing.  Operative cultures are negative at 24 hours.  I will continue vancomycin for now.  PLAN: 1. Continue vancomycin pending final cultures  Principal Problem:   Cellulitis and abscess of left foot Active Problems:   Severe obesity (BMI >= 40) (HCC)   OSA on CPAP   Essential hypertension   DM (diabetes mellitus), type 2, uncontrolled w/neurologic complication (HCC)   Unspecified atrial fibrillation (HCC)   Scheduled Meds: . cholecalciferol  4,000 Units Oral Daily  . fenofibrate  108 mg Oral Daily  . ibuprofen  400 mg Oral QHS  . insulin aspart  0-20 Units Subcutaneous TID WC  . insulin aspart  0-5 Units Subcutaneous QHS  . metoprolol tartrate  25 mg Oral BID  . sodium chloride flush  3 mL Intravenous Q12H   Continuous Infusions: . sodium chloride 10 mL/hr at 11/24/17 1332  . sodium chloride    . sodium chloride 75 mL/hr at 11/25/17 0920  . vancomycin 1,750 mg (11/25/17 0506)   PRN Meds:.sodium chloride, acetaminophen, albuterol, HYDROcodone-acetaminophen, oxyCODONE-acetaminophen, promethazine, sodium chloride flush, traMADol, traZODone   SUBJECTIVE: He is having much less pain in his left foot and says that his toes look better than I have been months.  They are less swollen.  Review of Systems: Review of Systems  Constitutional: Negative for chills, diaphoresis and fever.  Gastrointestinal: Negative for abdominal pain, diarrhea, nausea and vomiting.  Musculoskeletal: Positive for joint pain.    Allergies  Allergen Reactions  . Penicillins Anaphylaxis    Has patient had a PCN reaction causing immediate rash, facial/tongue/throat  swelling, SOB or lightheadedness with hypotension: Yes Has patient had a PCN reaction causing severe rash involving mucus membranes or skin necrosis: Yes Has patient had a PCN reaction that required hospitalization Yes Has patient had a PCN reaction occurring within the last 10 years: No If all of the above answers are "NO", then may proceed with Cephalosporin use.  . Tetanus Toxoids Anaphylaxis, Shortness Of Breath and Swelling  . Sulfa Antibiotics Nausea And Vomiting and Swelling    OBJECTIVE: Vitals:   11/25/17 1125 11/25/17 1128 11/25/17 1135 11/25/17 1138  BP: (!) 164/144 (!) 184/159 (!) 162/68 120/78  Pulse: (!) 48 (!) 45 (!) 50 (!) 48  Resp: 17     Temp: 98 F (36.7 C)     TempSrc: Oral     SpO2: 98%  98%   Weight:      Height:       Body mass index is 46.4 kg/m.  Physical Exam  Constitutional: He is oriented to person, place, and time.  He is talkative and in good spirits.  His wife is at the bedside.  Musculoskeletal:  His right foot is dressed.  His toes are less swollen today.  Neurological: He is alert and oriented to person, place, and time.  Skin: No rash noted.  Psychiatric: He has a normal mood and affect.    Lab Results Lab Results  Component Value Date   WBC 9.5 11/25/2017   HGB 12.4 (L) 11/25/2017   HCT 39.5  11/25/2017   MCV 91.4 11/25/2017   PLT 199 11/25/2017    Lab Results  Component Value Date   CREATININE 1.54 (H) 11/25/2017   BUN 26 (H) 11/25/2017   NA 135 11/25/2017   K 4.6 11/25/2017   CL 106 11/25/2017   CO2 22 11/25/2017    Lab Results  Component Value Date   ALT 14 11/25/2017   AST 17 11/25/2017   ALKPHOS 51 11/25/2017   BILITOT 0.8 11/25/2017     Microbiology: Recent Results (from the past 240 hour(s))  WOUND CULTURE     Status: None (Preliminary result)   Collection Time: 11/21/17  1:49 PM  Result Value Ref Range Status   MICRO NUMBER: [id number]  Preliminary   SPECIMEN QUALITY: ADEQUATE  Preliminary   SOURCE: NOT GIVEN   Preliminary   STATUS: PRELIMINARY  Preliminary   GRAM STAIN:   Preliminary    Rare Polymorphonuclear leukocytes Rare epithelial cells No organisms seen   RESULT: No Growth  Preliminary  Culture, blood (routine x 2)     Status: None (Preliminary result)   Collection Time: 11/23/17  4:06 PM  Result Value Ref Range Status   Specimen Description BLOOD LEFT ARM  Final   Special Requests   Final    BOTTLES DRAWN AEROBIC AND ANAEROBIC Blood Culture adequate volume   Culture   Final    NO GROWTH 2 DAYS Performed at [facility], [address], [place], [place] [address]    Report Status PENDING  Incomplete  Culture, blood (routine x 2)     Status: None (Preliminary result)   Collection Time: 11/23/17  4:09 PM  Result Value Ref Range Status   Specimen Description BLOOD RIGHT HAND  Final   Special Requests   Final    BOTTLES DRAWN AEROBIC AND ANAEROBIC Blood Culture adequate volume   Culture   Final    NO GROWTH 2 DAYS Performed at [facility], [address] [place], [place] [address]    Report Status PENDING  Incomplete  Surgical pcr screen     Status: None   Collection Time: 11/24/17  5:12 AM  Result Value Ref Range Status   MRSA, PCR NEGATIVE NEGATIVE Final   Staphylococcus aureus NEGATIVE NEGATIVE Final    Comment: (NOTE) The Xpert SA Assay (FDA approved for NASAL specimens in patients 29 years of age and older), is one component of a comprehensive surveillance program. It is not intended to diagnose infection nor to guide or monitor treatment. Performed at [facility], [address], [place], [place] [address]   Aerobic/Anaerobic Culture (surgical/deep wound)     Status: None (Preliminary result)   Collection Time: 11/24/17 11:13 AM  Result Value Ref Range Status   Specimen Description ABSCESS  Final   Special Requests NONE  Final   Gram Stain NO WBC SEEN NO ORGANISMS SEEN   Final   Culture   Final    NO GROWTH < 24 HOURS Performed at [facility], [address] [place], [place] [address]    Report Status PENDING  Incomplete    Cliffton Asters, MD [facility] [phone] pager   [phone] cell 11/25/2017, 2:13 PM

## 2017-11-25 NOTE — Progress Notes (Signed)
Attempted PICC placement in Left arm, unable to get PICC to enter distal SVC despite all attempts made.  CXR confirms malposition.  Dr Marland Mcalpine notified and states will notify IR in the am for repositioning. RN notified to leave LA PICC in place but not to use until seen by IR.  PIV currently in place.

## 2017-11-25 NOTE — Progress Notes (Signed)
PROGRESS NOTE    William Cameron  ZOX:[id number] DOB: 22-Aug-1950 DOA: 11/23/2017 PCP: Ralene Ok, MD  Brief Narrative:  The patient is a 67 y.o. male with medical history significant for L foot cellulitis/abscess, uncontrolled DM2, CVA, OSA, HTN, morbid obesity who underwent L foot surgery in mid June by Dr. Logan Bores (podiatry) who subsequently developed an abscess of the incision site which his son drained with a sterilized needle. Afterwards swelling and erythema improved and he took some leftover levaquin 750 mg BID. He presented to Dr. Logan Bores' office on 8/28 and cultures were drawn which ultimately grew MRSA. Plan was to remove screw on 9/11 with debridement and his LVQ was refilled; however, pt felt foot wound was getting worse, with increased pain and swelling and erythema. There aws serous fluid now draining from the top of his foot. He has had no documented fevers, no night sweats but overall has felt worse and has had increased weakness. Cultures from 8/28 were negative. He was directly admitted with the plan of getting a PICC and IV vanc as well as more immediate surgical treatment and patient was taken to surgery 11/24/17 by Dr. Logan Bores. Dr. Orvan Falconer of ID evaluated and recommended PICC Placement and continuing IV Vancomycin pending Final Cx's. Anticipate D/C Home tomorrow with PICC and long term Vancomcyin.   Assessment & Plan:   Principal Problem:   Cellulitis and abscess of left foot Active Problems:   Severe obesity (BMI >= 40) (HCC)   OSA on CPAP   Essential hypertension   DM (diabetes mellitus), type 2, uncontrolled w/neurologic complication (HCC)   Unspecified atrial fibrillation (HCC)  Cellulitis L foot, r/o OM L foot s/p orthopedic screws x2 in the left foot elbow although bone biopsy and antibiotic bead placement -s/p bunion and hammertoe surgery September 06 2017 -Admitted to med surg -BCx x 2 showed NGTD at 2 Days -Wound Cx showed No WBC's seen and No Organisms with No Growth  <24 hours  -Vancomycin per ID recs and will continue  -PICC Line to be placed but had to be re-done because was in the wrong place  -Checked CRP and was <0.8 -Continue monitor CBC, temp, vitals -S/p I and D, removal of screws, bone bx L foot tomorrow morning. Plan is also to place antibiotic beads; Patient is POD 1 (POD 0 yesterday) -F/u blood cultures, operative cultures -Appreciate podiatry and ID assistance -Hold Eliquis until after surgery and until ok to resume per Podiatry; Discused with Dr. Logan Bores and ok to resume today -Patient is Afebrile and has no Leukocytosis  -Follow up on Podiatry and ID Recc's  DM2 -Not on Home Insulin -hold home oral hypoglycemics -Resistant Novolog SSI AC/HS -CBG's ranging from 162-237 -Last HbA1c was 7.6 -carb consistent diet after Surgery  Essential HTN -pt states his BP has been lower than usual for past 2 weeks; it is on the lower side here -Hold amlodipine and benazepril for now -Continue metoprolol given h/o AF; will change from long-acting to short acting in case BP drops  H/o AF, on chronic anticoagulation -cont BB (changes as above) -Held Eliquis for surgery but ok to resume   OSA -Cont CPAP per home settings  AKI on CKD Stage 3 -Baseline CR ranging from 1.3-1.5 -Yesterday Cr was 1.72 -Start Gentle Fluid Hydration with NS at a rate of 75 mL/hr x 20 hours and repeated today and BUN/Cr improved to 26/1.54 -Avoid Nephrotoxic Medications if possible (Will be on Vancomycin per ID notes) and will hold Benazepril -Continue  to Monitor and Trend CMP  COPD/Chronic Bronchitis -Currently not in Exacerbation -Added Albuterol 2.5 mg Neb q6hprn Wheezing and SOB  HLD -C/w Fenofibrate 108 mg po Daily   Severe Morbid Obesity -Estimated body mass index is 46.4 kg/m as calculated from the following:   Height as of this encounter: 6' (1.829 m).   Weight as of this encounter: 155.2 kg.  -Weight Loss Counseling Given   Normocytic  Anemia -Hb/Hct went from 14.6/46.4 -> 12.4/39.5 and suspect a slight dilutional component  -Likely in the setting of Chronic Kidney Disease -Continue to Monitor for S/Sx of Bleeding -Repeat CBC in AM   DVT prophylaxis: SCDs, Anticoagulated with Eliquis and resume when ok with Podiatry  Code Status: FULL CODE Family Communication: Discussed with wife at bedside Disposition Plan: Anticipated D/C Home in the next 24 hours if medically stable and cleared by ID and Podiatry  Consultants:   Podiatry    Procedures:  -Done by Dr. Logan Bores 11/24/17 1. Incision and drainage left foot 2.  Removal of orthopedic screws x2 left foot 3.  Bone biopsy left foot 4.  Placement of antibiotic beads left foot  Antimicrobials:  Anti-infectives (From admission, onward)   Start     Dose/Rate Route Frequency Ordered Stop   11/25/17 0500  vancomycin (VANCOCIN) 1,750 mg in sodium chloride 0.9 % 500 mL IVPB     1,750 mg 250 mL/hr over 120 Minutes Intravenous Every 24 hours 11/24/17 1342     11/24/17 1134  tobramycin (NEBCIN) injection  Status:  Discontinued       As needed 11/24/17 1135 11/24/17 1205   11/24/17 1127  vancomycin (VANCOCIN) powder  Status:  Discontinued       As needed 11/24/17 1128 11/24/17 1205   11/24/17 1110  tobramycin (NEBCIN) powder  Status:  Discontinued       As needed 11/24/17 1112 11/24/17 1205   11/24/17 0600  vancomycin (VANCOCIN) 1,500 mg in sodium chloride 0.9 % 500 mL IVPB  Status:  Discontinued     1,500 mg 250 mL/hr over 120 Minutes Intravenous Every 12 hours 11/23/17 1539 11/24/17 1342   11/23/17 1545  vancomycin (VANCOCIN) 2,000 mg in sodium chloride 0.9 % 500 mL IVPB     2,000 mg 250 mL/hr over 120 Minutes Intravenous  Once 11/23/17 1539 11/23/17 2244     Subjective: Seen and Examined at bedside and states her foot was feeling better and states he feels like it is less swollen.  No chest pain, shortness breath, lightheadedness or dizziness.  Denies any foot pain today  and states he only gets pain when he puts pressure on it.  No other concerns or complaints at this time and ready for PICC line placement.  Objective: Vitals:   11/25/17 1125 11/25/17 1128 11/25/17 1135 11/25/17 1138  BP: (!) 164/144 (!) 184/159 (!) 162/68 120/78  Pulse: (!) 48 (!) 45 (!) 50 (!) 48  Resp: 17     Temp: 98 F (36.7 C)     TempSrc: Oral     SpO2: 98%  98%   Weight:      Height:        Intake/Output Summary (Last 24 hours) at 11/25/2017 1553 Last data filed at 11/25/2017 0948 Gross per 24 hour  Intake 1408.58 ml  Output -  Net 1408.58 ml   Filed Weights   11/23/17 1505  Weight: (!) 155.2 kg   Examination: Physical Exam:  Constitutional: Well-nourished, well-developed morbidly obese Caucasian male is currently in no  acute distress appears calm and comfortable Eyes: Lids and conjunctive are normal.  Sclera anicteric ENMT: External ears and nose appear normal.  Grossly normal hearing.  Mucous members are moist Neck: Appears supple no JVD external ears and nose appear normal. Respiratory: Diminished to auscultation bilaterally with no appreciable wheezing, rales, rhonchi.  Patient was not tachypneic using accessory muscles to breathe Cardiovascular: Regular rate and rhythm.  No appreciable murmurs, rubs, gallops.  Left foot extremity is wrapped in his bandage due to recent surgical intervention Abdomen: Soft, nontender, distended secondary body habitus.  Bowel sounds present x4 GU: Deferred Musculoskeletal: No contractures or cyanosis.  No joint deformities Skin: Skin is warm and dry.  Left leg and foot is wrapped.  No induration noted Neurologic: Cranial nerves II through XII grossly intact no appreciable focal deficits Psychiatric: Normal mood and affect.  Intact judgment insight.  Patient is awake, alert, and oriented x3  Data Reviewed: I have personally reviewed following labs and imaging studies  CBC: Recent Labs  Lab 11/23/17 1606 11/24/17 0443  11/25/17 0552  WBC 8.2 7.2 9.5  NEUTROABS 5.4  --  7.5  HGB 14.8 14.6 12.4*  HCT 45.7 46.4 39.5  MCV 90.7 91.7 91.4  PLT 239 219 199   Basic Metabolic Panel: Recent Labs  Lab 11/23/17 1606 11/24/17 0443 11/25/17 0552  NA 136 135 135  K 4.0 4.3 4.6  CL 103 103 106  CO2 22 25 22   GLUCOSE 143* 131* 176*  BUN 36* 32* 26*  CREATININE 1.68* 1.72* 1.54*  CALCIUM 10.0 9.8 8.9  MG  --   --  2.1  PHOS  --   --  3.1   GFR: Estimated Creatinine Clearance: 71.5 mL/min (A) (by C-G formula based on SCr of 1.54 mg/dL (H)). Liver Function Tests: Recent Labs  Lab 11/23/17 1606 11/25/17 0552  AST 23 17  ALT 14 14  ALKPHOS 57 51  BILITOT 1.0 0.8  PROT 7.5 6.4*  ALBUMIN 4.1 3.4*   No results for input(s): LIPASE, AMYLASE in the last 168 hours. No results for input(s): AMMONIA in the last 168 hours. Coagulation Profile: No results for input(s): INR, PROTIME in the last 168 hours. Cardiac Enzymes: No results for input(s): CKTOTAL, CKMB, CKMBINDEX, TROPONINI in the last 168 hours. BNP (last 3 results) No results for input(s): PROBNP in the last 8760 hours. HbA1C: No results for input(s): HGBA1C in the last 72 hours. CBG: Recent Labs  Lab 11/24/17 1703 11/24/17 1706 11/24/17 1808 11/24/17 2111 11/25/17 0815  GLUCAP 237* 216* 201* 226* 162*   Lipid Profile: No results for input(s): CHOL, HDL, LDLCALC, TRIG, CHOLHDL, LDLDIRECT in the last 72 hours. Thyroid Function Tests: No results for input(s): TSH, T4TOTAL, FREET4, T3FREE, THYROIDAB in the last 72 hours. Anemia Panel: No results for input(s): VITAMINB12, FOLATE, FERRITIN, TIBC, IRON, RETICCTPCT in the last 72 hours. Sepsis Labs: No results for input(s): PROCALCITON, LATICACIDVEN in the last 168 hours.  Recent Results (from the past 240 hour(s))  WOUND CULTURE     Status: None (Preliminary result)   Collection Time: 11/21/17  1:49 PM  Result Value Ref Range Status   MICRO NUMBER: [id number]  Preliminary   SPECIMEN  QUALITY: ADEQUATE  Preliminary   SOURCE: NOT GIVEN  Preliminary   STATUS: PRELIMINARY  Preliminary   GRAM STAIN:   Preliminary    Rare Polymorphonuclear leukocytes Rare epithelial cells No organisms seen   RESULT: No Growth  Preliminary  Culture, blood (routine x 2)  Status: None (Preliminary result)   Collection Time: 11/23/17  4:06 PM  Result Value Ref Range Status   Specimen Description BLOOD LEFT ARM  Final   Special Requests   Final    BOTTLES DRAWN AEROBIC AND ANAEROBIC Blood Culture adequate volume   Culture   Final    NO GROWTH 2 DAYS Performed at [facility], [address], [place], [place] [address]    Report Status PENDING  Incomplete  Culture, blood (routine x 2)     Status: None (Preliminary result)   Collection Time: 11/23/17  4:09 PM  Result Value Ref Range Status   Specimen Description BLOOD RIGHT HAND  Final   Special Requests   Final    BOTTLES DRAWN AEROBIC AND ANAEROBIC Blood Culture adequate volume   Culture   Final    NO GROWTH 2 DAYS Performed at [facility], [address], [place], [place] [address]    Report Status PENDING  Incomplete  Surgical pcr screen     Status: None   Collection Time: 11/24/17  5:12 AM  Result Value Ref Range Status   MRSA, PCR NEGATIVE NEGATIVE Final   Staphylococcus aureus NEGATIVE NEGATIVE Final    Comment: (NOTE) The Xpert SA Assay (FDA approved for NASAL specimens in patients 32 years of age and older), is one component of a comprehensive surveillance program. It is not intended to diagnose infection nor to guide or monitor treatment. Performed at [facility], [address] [place], [place] [address]   Aerobic/Anaerobic Culture (surgical/deep wound)     Status: None (Preliminary result)   Collection Time: 11/24/17 11:13 AM  Result Value Ref Range Status   Specimen Description ABSCESS  Final   Special Requests NONE  Final   Gram Stain NO WBC SEEN NO ORGANISMS SEEN   Final   Culture   Final     NO GROWTH < 24 HOURS Performed at [facility], [address], [place], [place] [address]    Report Status PENDING  Incomplete    Radiology Studies: Dg Chest Port 1 View  Result Date: 11/25/2017 CLINICAL DATA:  PICC line placement EXAM: PORTABLE CHEST 1 VIEW COMPARISON:  None. FINDINGS: PICC line from a RIGHT upper arm access enters the RIGHT subclavian vein region but loops back into the RIGHT axilla Lungs are clear.  No pulmonary edema or pneumothorax. IMPRESSION: Malpositioned PICC line. PICC line enters the RIGHT subclavian region and loops back into the RIGHT axilla. These results will be called to the ordering clinician or representative by the Radiologist Assistant, and communication documented in the PACS or zVision Dashboard. Electronically Signed   By: Genevive Bi M.D.   On: 11/25/2017 15:02   Dg Foot Complete Left  Result Date: 11/24/2017 CLINICAL DATA:  Postop EXAM: LEFT FOOT - COMPLETE 3+ VIEW COMPARISON:  11/12/2017 FINDINGS: Two screws in the first metatarsal have been removed. Antibiotic beads are present. There is bone destruction and apparent osteomyelitis of the first metatarsal. Osteotomy of the second, third, and fourth proximal phalanges for prior hammertoe repair. IMPRESSION: Removal of hardware in the first metatarsal with antibiotic bead placement. Bony defect related to infection. Electronically Signed   By: Marlan Palau M.D.   On: 11/24/2017 15:45   [place] Ekg Site Rite  Result Date: 11/25/2017 If Site Rite image not attached, placement could not be confirmed due to current cardiac rhythm.  [place] Ekg Site Rite  Result Date: 11/23/2017 If [facility]  image not attached, placement could not be confirmed due to current cardiac rhythm.  Scheduled Meds: . cholecalciferol  4,000 Units Oral Daily  . fenofibrate  108 mg Oral Daily  . ibuprofen  400 mg Oral QHS  . insulin aspart  0-20 Units Subcutaneous TID WC  . insulin aspart  0-5 Units Subcutaneous QHS  .  metoprolol tartrate  25 mg Oral BID  . sodium chloride flush  3 mL Intravenous Q12H   Continuous Infusions: . sodium chloride 10 mL/hr at 11/24/17 1332  . sodium chloride    . sodium chloride 75 mL/hr at 11/25/17 0920  . vancomycin 1,750 mg (11/25/17 0506)    LOS: 2 days    Merlene Laughter, DO [facility] PAGER is on AMION  If 7PM-7AM, please contact night-coverage [email or web address] Password [email or web address] 11/25/2017, 3:53 PM

## 2017-11-25 NOTE — Progress Notes (Signed)
Peripherally Inserted Central Catheter/Midline Placement  The IV Nurse has discussed with the patient and/or persons authorized to consent for the patient, the purpose of this procedure and the potential benefits and risks involved with this procedure.  The benefits include less needle sticks, lab draws from the catheter, and the patient may be discharged home with the catheter. Risks include, but not limited to, infection, bleeding, blood clot (thrombus formation), and puncture of an artery; nerve damage and irregular heartbeat and possibility to perform a PICC exchange if needed/ordered by physician.  Alternatives to this procedure were also discussed.  Bard Power PICC patient education guide, fact sheet on infection prevention and patient information card has been provided to patient /or left at bedside. Patient and wife notified of options to exchange RA PICC, attempt LA PICC or refer to IR for PICC placement.Patient and wife agreeable to Left arm attempt due to malposition of Right Arm PICC. Options explained to pt and wife.  Patient states ok to proceed.  PICC/Midline Placement Documentation  PICC Single Lumen 11/25/17 PICC Left Basilic 48 cm 0 cm (Active)  Indication for Insertion or Continuance of Line Home intravenous therapies (PICC only) 11/25/2017  6:00 PM  Exposed Catheter (cm) 0 cm 11/25/2017  6:00 PM  Site Assessment Clean;Dry;Intact 11/25/2017  6:00 PM  Line Status Flushed;Blood return noted;Saline locked 11/25/2017  6:00 PM  Dressing Type Transparent 11/25/2017  6:00 PM  Dressing Status Clean;Dry;Intact 11/25/2017  6:00 PM  Line Care Connections checked and tightened 11/25/2017  6:00 PM  Dressing Change Due 12/02/17 11/25/2017  6:00 PM       Elliot Dally 11/25/2017, 6:45 PM

## 2017-11-25 NOTE — Progress Notes (Signed)
Patients home CPAP at bedside. Patient stated he didn't need assistance. Let him know if he changed his mind to let RN know to call RT.

## 2017-11-26 LAB — CBC WITH DIFFERENTIAL/PLATELET
Abs Immature Granulocytes: 0.1 10*3/uL (ref 0.0–0.1)
Basophils Absolute: 0.1 10*3/uL (ref 0.0–0.1)
Basophils Relative: 1 %
EOS ABS: 0.2 10*3/uL (ref 0.0–0.7)
EOS PCT: 2 %
HEMATOCRIT: 39.9 % (ref 39.0–52.0)
Hemoglobin: 12.4 g/dL — ABNORMAL LOW (ref 13.0–17.0)
Immature Granulocytes: 1 %
LYMPHS ABS: 2.6 10*3/uL (ref 0.7–4.0)
Lymphocytes Relative: 35 %
MCH: 28.7 pg (ref 26.0–34.0)
MCHC: 31.1 g/dL (ref 30.0–36.0)
MCV: 92.4 fL (ref 78.0–100.0)
MONO ABS: 0.6 10*3/uL (ref 0.1–1.0)
Monocytes Relative: 9 %
Neutro Abs: 3.9 10*3/uL (ref 1.7–7.7)
Neutrophils Relative %: 52 %
Platelets: 165 10*3/uL (ref 150–400)
RBC: 4.32 MIL/uL (ref 4.22–5.81)
RDW: 15.2 % (ref 11.5–15.5)
WBC: 7.4 10*3/uL (ref 4.0–10.5)

## 2017-11-26 LAB — GLUCOSE, CAPILLARY
GLUCOSE-CAPILLARY: 141 mg/dL — AB (ref 70–99)
GLUCOSE-CAPILLARY: 163 mg/dL — AB (ref 70–99)
Glucose-Capillary: 137 mg/dL — ABNORMAL HIGH (ref 70–99)
Glucose-Capillary: 117 mg/dL — ABNORMAL HIGH (ref 70–99)

## 2017-11-26 LAB — COMPREHENSIVE METABOLIC PANEL
ALBUMIN: 3.2 g/dL — AB (ref 3.5–5.0)
ALT: 13 U/L (ref 0–44)
ANION GAP: 8 (ref 5–15)
AST: 17 U/L (ref 15–41)
Alkaline Phosphatase: 48 U/L (ref 38–126)
BILIRUBIN TOTAL: 0.6 mg/dL (ref 0.3–1.2)
BUN: 20 mg/dL (ref 8–23)
CALCIUM: 9 mg/dL (ref 8.9–10.3)
CO2: 22 mmol/L (ref 22–32)
Chloride: 107 mmol/L (ref 98–111)
Creatinine, Ser: 1.41 mg/dL — ABNORMAL HIGH (ref 0.61–1.24)
GFR, EST AFRICAN AMERICAN: 58 mL/min — AB (ref >60)
GFR, EST NON AFRICAN AMERICAN: 50 mL/min — AB (ref >60)
Glucose, Bld: 137 mg/dL — ABNORMAL HIGH (ref 70–99)
POTASSIUM: 4.3 mmol/L (ref 3.5–5.1)
Sodium: 137 mmol/L (ref 135–145)
TOTAL PROTEIN: 6.3 g/dL — AB (ref 6.5–8.1)

## 2017-11-26 LAB — MAGNESIUM: Magnesium: 1.8 mg/dL (ref 1.7–2.4)

## 2017-11-26 LAB — PHOSPHORUS: PHOSPHORUS: 2.5 mg/dL (ref 2.5–4.6)

## 2017-11-26 NOTE — Progress Notes (Signed)
Patient not needing assistance with home CPAP at this time.

## 2017-11-26 NOTE — Progress Notes (Signed)
[facility]  Date of Admission:  11/23/2017   Total days of antibiotics 4        Day 4 vancomycin          ASSESSMENT: William Cameron is very happy about how good his foot looks but frustrated with PICC line problems. From my perspective from seeing him last on Friday prior to admission there has been some dramatic improvement following surgery and removal of hardware. William Cameron is scheduled to have this placed in IR tomorrow morning. Op cultures are negative at 48 hours following taking levaquin prior to admission. Bone biopsy pathology is not back so duration for now to be determined. Will consider adding once daily ertapenem if William Cameron remains culture negative, although William Cameron has had substantial improvement on vancomycin alone already.  Hopeful for discharge tomorrow following PICC placement.   PLAN: 1. PICC placement as scheduled in IR.  2. Continue vancomycin   Principal Problem:   Cellulitis and abscess of left foot Active Problems:   DM (diabetes mellitus), type 2, uncontrolled w/neurologic complication (HCC)   Severe obesity (BMI >= 40) (HCC)   OSA on CPAP   Essential hypertension   Unspecified atrial fibrillation (HCC)   . apixaban  5 mg Oral BID  . cholecalciferol  4,000 Units Oral Daily  . fenofibrate  108 mg Oral Daily  . ibuprofen  400 mg Oral QHS  . insulin aspart  0-20 Units Subcutaneous TID WC  . insulin aspart  0-5 Units Subcutaneous QHS  . metoprolol tartrate  25 mg Oral BID  . sodium chloride flush  3 mL Intravenous Q12H    SUBJECTIVE: William Cameron is feeling better today and walked in the hallway without pain earlier. The swelling is so much improved now that the bandages are falling off his foot. Has had 2 PICC line attempts and both times it resulted in malpositioned tip. William Cameron is scheduled to have this placed for him in IR tomorrow. Good appetite but hates the food up here. Wife bringing in food from home/restaurants.   Review of Systems    Constitutional: Negative for chills and fever.  HENT: Negative for tinnitus.   Eyes: Negative for blurred vision and photophobia.  Respiratory: Negative for cough and sputum production.   Cardiovascular: Negative for chest pain.  Gastrointestinal: Negative for diarrhea, nausea and vomiting.  Genitourinary: Negative for dysuria.  Skin: Negative for rash.  Neurological: Negative for headaches.    Allergies  Allergen Reactions  . Penicillins Anaphylaxis    Has patient had a PCN reaction causing immediate rash, facial/tongue/throat swelling, SOB or lightheadedness with hypotension: Yes Has patient had a PCN reaction causing severe rash involving mucus membranes or skin necrosis: Yes Has patient had a PCN reaction that required hospitalization Yes Has patient had a PCN reaction occurring within the last 10 years: No If all of the above answers are "NO", then may proceed with Cephalosporin use.  . Tetanus Toxoids Anaphylaxis, Shortness Of Breath and Swelling  . Sulfa Antibiotics Nausea And Vomiting and Swelling    OBJECTIVE: Vitals:   11/25/17 1926 11/26/17 0010 11/26/17 0519 11/26/17 0939  BP: (!) 154/64 (!) 128/59 (!) 146/73 (!) 144/64  Pulse: (!) 44 91 (!) 42 (!) 44  Resp: 18 18 18 17   Temp: 98.8 F (37.1 C) 97.8 F (36.6 C) 97.7 F (36.5 C) 98.4 F (36.9 C)  TempSrc: Oral Oral  Oral  SpO2: 99% 96% 98% 97%  Weight:  Height:       Body mass index is 46.4 kg/m.  Physical Exam  Constitutional: William Cameron is oriented to person, place, and time.  Resting flat in bed after just having had PICC line removed.   HENT:  Mouth/Throat: Oropharynx is clear and moist. No oral lesions. Normal dentition. No dental caries.  Eyes: Pupils are equal, round, and reactive to light. No scleral icterus.  Cardiovascular: Normal rate, regular rhythm and normal heart sounds.  Pulmonary/Chest: Effort normal and breath sounds normal.  Abdominal: Soft. William Cameron exhibits no distension. There is no  tenderness.  Musculoskeletal:  Left foot normal color and temperature. Swelling since the last time I saw it on Friday is dramatically improved. Some old/dried bloody drainage on the dressing. Very loose.   Lymphadenopathy:    William Cameron has no cervical adenopathy.  Neurological: William Cameron is alert and oriented to person, place, and time.  Skin: Skin is warm and dry. No rash noted.  Psychiatric: William Cameron has a normal mood and affect. Judgment normal.  Nursing note and vitals reviewed.   Lab Results Lab Results  Component Value Date   WBC 7.4 11/26/2017   HGB 12.4 (L) 11/26/2017   HCT 39.9 11/26/2017   MCV 92.4 11/26/2017   PLT 165 11/26/2017    Lab Results  Component Value Date   CREATININE 1.41 (H) 11/26/2017   BUN 20 11/26/2017   NA 137 11/26/2017   K 4.3 11/26/2017   CL 107 11/26/2017   CO2 22 11/26/2017    Lab Results  Component Value Date   ALT 13 11/26/2017   AST 17 11/26/2017   ALKPHOS 48 11/26/2017   BILITOT 0.6 11/26/2017     Microbiology: Recent Results (from the past 240 hour(s))  WOUND CULTURE     Status: None (Preliminary result)   Collection Time: 11/21/17  1:49 PM  Result Value Ref Range Status   MICRO NUMBER: [id number]  Preliminary   SPECIMEN QUALITY: ADEQUATE  Preliminary   SOURCE: NOT GIVEN  Preliminary   STATUS: PRELIMINARY  Preliminary   GRAM STAIN:   Preliminary    Rare Polymorphonuclear leukocytes Rare epithelial cells No organisms seen   RESULT: No Growth  Preliminary  Culture, blood (routine x 2)     Status: None (Preliminary result)   Collection Time: 11/23/17  4:06 PM  Result Value Ref Range Status   Specimen Description BLOOD LEFT ARM  Final   Special Requests   Final    BOTTLES DRAWN AEROBIC AND ANAEROBIC Blood Culture adequate volume   Culture   Final    NO GROWTH 3 DAYS Performed at [facility], [address] [place], [place] [address]    Report Status PENDING  Incomplete  Culture, blood (routine x 2)     Status: None (Preliminary result)    Collection Time: 11/23/17  4:09 PM  Result Value Ref Range Status   Specimen Description BLOOD RIGHT HAND  Final   Special Requests   Final    BOTTLES DRAWN AEROBIC AND ANAEROBIC Blood Culture adequate volume   Culture   Final    NO GROWTH 3 DAYS Performed at [facility], [address] [place], [place] [address]    Report Status PENDING  Incomplete  Surgical pcr screen     Status: None   Collection Time: 11/24/17  5:12 AM  Result Value Ref Range Status   MRSA, PCR NEGATIVE NEGATIVE Final   Staphylococcus aureus NEGATIVE NEGATIVE Final    Comment: (NOTE) The  Xpert SA Assay (FDA approved for NASAL specimens in patients 58 years of age and older), is one component of a comprehensive surveillance program. It is not intended to diagnose infection nor to guide or monitor treatment. Performed at [facility], [address] [place], [place] [address]   Aerobic/Anaerobic Culture (surgical/deep wound)     Status: None (Preliminary result)   Collection Time: 11/24/17 11:13 AM  Result Value Ref Range Status   Specimen Description ABSCESS  Final   Special Requests NONE  Final   Gram Stain NO WBC SEEN NO ORGANISMS SEEN   Final   Culture   Final    NO GROWTH 2 DAYS Performed at [facility], [address], [place], [place] [address]    Report Status PENDING  Incomplete    Rexene Alberts, MSN, NP-C [facility] Cell: [phone] Pager: [phone]  11/26/2017  12:18 PM

## 2017-11-26 NOTE — Consult Note (Signed)
Dr. Marland Mcalpine called IV Team to inform us that IR will not be coming in today to place a PICC for this pt today, would like a PIV placed for IV Vancomycin, advised this pt may not let the IV Team place an IV, he verbalizes understanding.

## 2017-11-26 NOTE — Progress Notes (Signed)
Spoke with pt's nurse, Marylene Land. IR will see patient on 9/3. Pt's next vancomycin dose is due 9/3 at 0500. Will need PIV before that dose.

## 2017-11-26 NOTE — Care Management Important Message (Signed)
Important Message  Patient Details  Name: William Cameron MRN: [id number] Date of Birth: 11/21/1950   Medicare Important Message Given:  Yes    Alayziah Tangeman Stefan Church 11/26/2017, 3:54 PM

## 2017-11-26 NOTE — Progress Notes (Signed)
PROGRESS NOTE    William Cameron  OBS:[id number] DOB: 1950-06-08 DOA: 11/23/2017 PCP: Ralene Ok, MD  Brief Narrative:  The patient is a 67 y.o. male with medical history significant for L foot cellulitis/abscess, uncontrolled DM2, CVA, OSA, HTN, morbid obesity who underwent L foot surgery in mid June by Dr. Logan Bores (podiatry) who subsequently developed an abscess of the incision site which his son drained with a sterilized needle. Afterwards swelling and erythema improved and he took some leftover levaquin 750 mg BID. He presented to Dr. Logan Bores' office on 8/28 and cultures were drawn which ultimately grew MRSA. Plan was to remove screw on 9/11 with debridement and his LVQ was refilled; however, pt felt foot wound was getting worse, with increased pain and swelling and erythema. There aws serous fluid now draining from the top of his foot. He has had no documented fevers, no night sweats but overall has felt worse and has had increased weakness. Cultures from 8/28 were negative. He was directly admitted with the plan of getting a PICC and IV vanc as well as more immediate surgical treatment and patient was taken to surgery 11/24/17 by Dr. Logan Bores. Dr. Orvan Falconer of ID evaluated and recommended PICC Placement and continuing IV Vancomycin pending Final Cx's.  PICC line had issues and was malpositioned on both attempts and unfortunately are unavailable to exchange today and because PICC line was hurting had to be removed.  Patient to go for IR placed PICC line in a.m. after my discussion with interventional radiologist Dr. Richardson Chiquito as he states that only emergent cases will be done on the holiday weekend.  ID following and recommending continuing IV vancomycin with possible addition of IV ertapenem but will defer to them.  Assessment & Plan:   Principal Problem:   Cellulitis and abscess of left foot Active Problems:   Severe obesity (BMI >= 40) (HCC)   OSA on CPAP   Essential hypertension   DM (diabetes  mellitus), type 2, uncontrolled w/neurologic complication (HCC)   Unspecified atrial fibrillation (HCC)  Cellulitis L foot, r/o OM L foot s/p orthopedic screws x2 in the left foot elbow although bone biopsy and antibiotic bead placement -s/p bunion and hammertoe surgery September 06 2017 -Admitted to med surg -BCx x 2 showed NGTD at 3 Days -Wound Cx showed No WBC's seen and No Organisms with No Growth at 2 days -Vancomycin per ID recs and will continue  -PICC Line to be placed but had to be re-done because was in the wrong place and attempted x2 by PICC Team. Will need to have it re-done by IR as PICC was removed today because of pain and malpositioning  -Checked CRP and was <0.8 -Continue monitor CBC, temp, vitals -S/p I and D, removal of screws, bone bx L foot tomorrow morning. Plan is also to place antibiotic beads; Patient is POD 2  -F/u blood cultures, operative cultures -Appreciate podiatry and ID assistance -Resumed Eliquis -Patient is Afebrile and has no Leukocytosis  -Follow up on Podiatry and ID Recc's  DM2 -Not on Home Insulin -hold home oral hypoglycemics -Resistant Novolog SSI AC/HS -CBG's ranging from 137-183 -Last HbA1c was 7.6 -carb consistent diet after Surgery  Essential HTN -Pt states his BP has been lower than usual for past 2 weeks; it is on the lower side here and last reading was at goal at 123/72 -Hold amlodipine and benazepril for now -Continue metoprolol given h/o AF; will change from long-acting to short acting in case BP drops  H/o  AF, on chronic anticoagulation -cont BB (changes as above) -Held Eliquis for surgery but ok to resume   OSA -Cont CPAP per home settings  AKI on CKD Stage 3 -Baseline CR ranging from 1.3-1.5 -Cr was 1.72 during admission -Start Gentle Fluid Hydration with NS at a rate of 75 mL/hr x 20 hours and repeated yesterday and BUN/Cr improved to 20/1.41 -IVF Hydration now stiopped due to lack of IV Access and will encourage po  intake -Avoid Nephrotoxic Medications if possible (Will be on Vancomycin per ID notes) and will hold Benazepril -Continue to Monitor and Trend CMP  COPD/Chronic Bronchitis -Currently not in Exacerbation -Added Albuterol 2.5 mg Neb q6hprn Wheezing and SOB  HLD -C/w Fenofibrate 108 mg po Daily   Severe Morbid Obesity -Estimated body mass index is 46.4 kg/m as calculated from the following:   Height as of this encounter: 6' (1.829 m).   Weight as of this encounter: 155.2 kg.  -Weight Loss Counseling Given   Normocytic Anemia -Hb/Hct went from 14.6/46.4 -> 12.4/39.9 and suspect a slight dilutional component  -Likely in the setting of Chronic Kidney Disease -Continue to Monitor for S/Sx of Bleeding -Repeat CBC in AM   DVT prophylaxis: SCDs, Anticoagulated with Eliquis and resume when ok with Podiatry  Code Status: FULL CODE Family Communication: Discussed with wife at bedside Disposition Plan: Anticipated D/C Home in the next 24 hours if medically stable and cleared by ID and Podiatry and PICC line is Placed  Consultants:   Podiatry   Infectious Diseases  IR    Procedures:  -Done by Dr. Logan Bores 11/24/17 1. Incision and drainage left foot 2.  Removal of orthopedic screws x2 left foot 3.  Bone biopsy left foot 4.  Placement of antibiotic beads left foot  Antimicrobials:  Anti-infectives (From admission, onward)   Start     Dose/Rate Route Frequency Ordered Stop   11/25/17 0500  vancomycin (VANCOCIN) 1,750 mg in sodium chloride 0.9 % 500 mL IVPB     1,750 mg 250 mL/hr over 120 Minutes Intravenous Every 24 hours 11/24/17 1342     11/24/17 1134  tobramycin (NEBCIN) injection  Status:  Discontinued       As needed 11/24/17 1135 11/24/17 1205   11/24/17 1127  vancomycin (VANCOCIN) powder  Status:  Discontinued       As needed 11/24/17 1128 11/24/17 1205   11/24/17 1110  tobramycin (NEBCIN) powder  Status:  Discontinued       As needed 11/24/17 1112 11/24/17 1205   11/24/17  0600  vancomycin (VANCOCIN) 1,500 mg in sodium chloride 0.9 % 500 mL IVPB  Status:  Discontinued     1,500 mg 250 mL/hr over 120 Minutes Intravenous Every 12 hours 11/23/17 1539 11/24/17 1342   11/23/17 1545  vancomycin (VANCOCIN) 2,000 mg in sodium chloride 0.9 % 500 mL IVPB     2,000 mg 250 mL/hr over 120 Minutes Intravenous  Once 11/23/17 1539 11/23/17 2244     Subjective: Seen and Examined at bedside and states his foot feels great however was very annoyed about what happened with his PICC line placement.  Wife is very upset with PICC nurse and was hoping to get it done today per IR.  No chest pain, shortness breath, nausea, vomiting.  Was ambulating and up and walking.  No other concerns or complaints at this time  Objective: Vitals:   11/26/17 0010 11/26/17 0519 11/26/17 0939 11/26/17 1344  BP: (!) 128/59 (!) 146/73 (!) 144/64 123/72  Pulse:  91 (!) 42 (!) 44 (!) 49  Resp: 18 18 17 20   Temp: 97.8 F (36.6 C) 97.7 F (36.5 C) 98.4 F (36.9 C) (!) 97.5 F (36.4 C)  TempSrc: Oral  Oral Oral  SpO2: 96% 98% 97% 97%  Weight:      Height:        Intake/Output Summary (Last 24 hours) at 11/26/2017 1449 Last data filed at 11/26/2017 1330 Gross per 24 hour  Intake 10052.56 ml  Output -  Net 10052.56 ml   Filed Weights   11/23/17 1505  Weight: (!) 155.2 kg   Examination: Physical Exam:  Constitutional: Well-nourished, well-developed morbidly obese Caucasian male is currently no acute distress appears calm comfortable and ambulating the halls well Eyes: Sclera anicteric.  Lids and conjunctive are normal ENMT: External ears and nose appear normal.  Grossly normal hearing.  Mucous members appear moist Neck: Neck is supple with no appreciable JVD Respiratory: Diminished to auscultation bilaterally no appreciable wheezing, rales, rhonchi.  Patient was not tachypneic using accessory muscle breathe Cardiovascular: Regular rate and rhythm.  No appreciable murmurs, rubs, gallops.  Left  foot extremity is wrapped in bandage Abdomen: Soft, nontender, distended secondary body habitus.  Bowel sounds present in 4 quadrants GU: Deferred Musculoskeletal: No contractures cyanosis.  No joint deformities noted Skin: Skin is warm and dry.  Left foot is wrapped.  Neurologic: Cranial nerves II through XII grossly intact no appreciable focal deficits Psychiatric: Normal mood and affect.  Intact judgment insight.  Patient is awake, alert and oriented x3   Data Reviewed: I have personally reviewed following labs and imaging studies  CBC: Recent Labs  Lab 11/23/17 1606 11/24/17 0443 11/25/17 0552 11/26/17 0751  WBC 8.2 7.2 9.5 7.4  NEUTROABS 5.4  --  7.5 3.9  HGB 14.8 14.6 12.4* 12.4*  HCT 45.7 46.4 39.5 39.9  MCV 90.7 91.7 91.4 92.4  PLT 239 219 199 165   Basic Metabolic Panel: Recent Labs  Lab 11/23/17 1606 11/24/17 0443 11/25/17 0552 11/26/17 0751  NA 136 135 135 137  K 4.0 4.3 4.6 4.3  CL 103 103 106 107  CO2 22 25 22 22   GLUCOSE 143* 131* 176* 137*  BUN 36* 32* 26* 20  CREATININE 1.68* 1.72* 1.54* 1.41*  CALCIUM 10.0 9.8 8.9 9.0  MG  --   --  2.1 1.8  PHOS  --   --  3.1 2.5   GFR: Estimated Creatinine Clearance: 78.1 mL/min (A) (by C-G formula based on SCr of 1.41 mg/dL (H)). Liver Function Tests: Recent Labs  Lab 11/23/17 1606 11/25/17 0552 11/26/17 0751  AST 23 17 17   ALT 14 14 13   ALKPHOS 57 51 48  BILITOT 1.0 0.8 0.6  PROT 7.5 6.4* 6.3*  ALBUMIN 4.1 3.4* 3.2*   No results for input(s): LIPASE, AMYLASE in the last 168 hours. No results for input(s): AMMONIA in the last 168 hours. Coagulation Profile: No results for input(s): INR, PROTIME in the last 168 hours. Cardiac Enzymes: No results for input(s): CKTOTAL, CKMB, CKMBINDEX, TROPONINI in the last 168 hours. BNP (last 3 results) No results for input(s): PROBNP in the last 8760 hours. HbA1C: No results for input(s): HGBA1C in the last 72 hours. CBG: Recent Labs  Lab 11/25/17 0815  11/25/17 1653 11/25/17 2121 11/26/17 0758 11/26/17 1222  GLUCAP 162* 183* 178* 137* 141*   Lipid Profile: No results for input(s): CHOL, HDL, LDLCALC, TRIG, CHOLHDL, LDLDIRECT in the last 72 hours. Thyroid Function Tests: No results  for input(s): TSH, T4TOTAL, FREET4, T3FREE, THYROIDAB in the last 72 hours. Anemia Panel: No results for input(s): VITAMINB12, FOLATE, FERRITIN, TIBC, IRON, RETICCTPCT in the last 72 hours. Sepsis Labs: No results for input(s): PROCALCITON, LATICACIDVEN in the last 168 hours.  Recent Results (from the past 240 hour(s))  WOUND CULTURE     Status: None (Preliminary result)   Collection Time: 11/21/17  1:49 PM  Result Value Ref Range Status   MICRO NUMBER: [id number]  Preliminary   SPECIMEN QUALITY: ADEQUATE  Preliminary   SOURCE: NOT GIVEN  Preliminary   STATUS: PRELIMINARY  Preliminary   GRAM STAIN:   Preliminary    Rare Polymorphonuclear leukocytes Rare epithelial cells No organisms seen   RESULT: No Growth  Preliminary  Culture, blood (routine x 2)     Status: None (Preliminary result)   Collection Time: 11/23/17  4:06 PM  Result Value Ref Range Status   Specimen Description BLOOD LEFT ARM  Final   Special Requests   Final    BOTTLES DRAWN AEROBIC AND ANAEROBIC Blood Culture adequate volume   Culture   Final    NO GROWTH 3 DAYS Performed at [facility], [address], [place], [place] [address]    Report Status PENDING  Incomplete  Culture, blood (routine x 2)     Status: None (Preliminary result)   Collection Time: 11/23/17  4:09 PM  Result Value Ref Range Status   Specimen Description BLOOD RIGHT HAND  Final   Special Requests   Final    BOTTLES DRAWN AEROBIC AND ANAEROBIC Blood Culture adequate volume   Culture   Final    NO GROWTH 3 DAYS Performed at [facility], [address], [place], [place] [address]    Report Status PENDING  Incomplete  Surgical pcr screen     Status: None   Collection Time: 11/24/17  5:12 AM    Result Value Ref Range Status   MRSA, PCR NEGATIVE NEGATIVE Final   Staphylococcus aureus NEGATIVE NEGATIVE Final    Comment: (NOTE) The Xpert SA Assay (FDA approved for NASAL specimens in patients 29 years of age and older), is one component of a comprehensive surveillance program. It is not intended to diagnose infection nor to guide or monitor treatment. Performed at [facility], [address] [place], [place] [address]   Aerobic/Anaerobic Culture (surgical/deep wound)     Status: None (Preliminary result)   Collection Time: 11/24/17 11:13 AM  Result Value Ref Range Status   Specimen Description ABSCESS  Final   Special Requests NONE  Final   Gram Stain NO WBC SEEN NO ORGANISMS SEEN   Final   Culture   Final    NO GROWTH 2 DAYS NO ANAEROBES ISOLATED; CULTURE IN PROGRESS FOR 5 DAYS Performed at [facility], [address] [place], [place] [address]    Report Status PENDING  Incomplete    Radiology Studies: Dg Chest Port 1 View  Result Date: 11/25/2017 CLINICAL DATA:  67 year old male status post PICC placement. EXAM: PORTABLE CHEST 1 VIEW COMPARISON:  Chest x-ray 11/25/2017. FINDINGS: Previously noted right-sided PICC is not identified on today's examination, and has presumably been withdrawn. Lung volumes are normal. No consolidative airspace disease. No pleural effusions. No pneumothorax. No pulmonary nodule or mass noted. Pulmonary vasculature and the cardiomediastinal silhouette are within normal limits. IMPRESSION: 1. No radiographic evidence of acute cardiopulmonary disease. 2. Withdrawal of previously noted right-sided PICC. Electronically Signed   By:  Trudie Reed M.D.   On: 11/25/2017 18:39   Dg Chest Port 1 View  Result Date: 11/25/2017 CLINICAL DATA:  PICC line placement EXAM: PORTABLE CHEST 1 VIEW COMPARISON:  None. FINDINGS: PICC line from a RIGHT upper arm access enters the RIGHT subclavian vein region but loops back into the RIGHT axilla Lungs are  clear.  No pulmonary edema or pneumothorax. IMPRESSION: Malpositioned PICC line. PICC line enters the RIGHT subclavian region and loops back into the RIGHT axilla. These results will be called to the ordering clinician or representative by the Radiologist Assistant, and communication documented in the PACS or zVision Dashboard. Electronically Signed   By: Genevive Bi M.D.   On: 11/25/2017 15:02   [place] Ekg Site Rite  Result Date: 11/25/2017 If Site Rite image not attached, placement could not be confirmed due to current cardiac rhythm.  Scheduled Meds: . apixaban  5 mg Oral BID  . cholecalciferol  4,000 Units Oral Daily  . fenofibrate  108 mg Oral Daily  . ibuprofen  400 mg Oral QHS  . insulin aspart  0-20 Units Subcutaneous TID WC  . insulin aspart  0-5 Units Subcutaneous QHS  . metoprolol tartrate  25 mg Oral BID  . sodium chloride flush  3 mL Intravenous Q12H   Continuous Infusions: . sodium chloride 10 mL/hr at 11/24/17 1332  . sodium chloride    . vancomycin 1,750 mg (11/26/17 0514)    LOS: 3 days    Merlene Laughter, DO [facility] PAGER is on AMION  If 7PM-7AM, please contact night-coverage [email or web address] Password [facility] 11/26/2017, 2:49 PM

## 2017-11-27 ENCOUNTER — Inpatient Hospital Stay (HOSPITAL_COMMUNITY): Payer: Medicare HMO

## 2017-11-27 ENCOUNTER — Encounter (HOSPITAL_COMMUNITY): Payer: Self-pay | Admitting: Podiatry

## 2017-11-27 DIAGNOSIS — M869 Osteomyelitis, unspecified: Secondary | ICD-10-CM

## 2017-11-27 DIAGNOSIS — Z95828 Presence of other vascular implants and grafts: Secondary | ICD-10-CM

## 2017-11-27 DIAGNOSIS — B9561 Methicillin susceptible Staphylococcus aureus infection as the cause of diseases classified elsewhere: Secondary | ICD-10-CM

## 2017-11-27 LAB — CBC WITH DIFFERENTIAL/PLATELET
ABS IMMATURE GRANULOCYTES: 0.1 10*3/uL (ref 0.0–0.1)
BASOS PCT: 1 %
Basophils Absolute: 0.1 10*3/uL (ref 0.0–0.1)
EOS PCT: 2 %
Eosinophils Absolute: 0.2 10*3/uL (ref 0.0–0.7)
HCT: 43.6 % (ref 39.0–52.0)
Hemoglobin: 13.5 g/dL (ref 13.0–17.0)
Immature Granulocytes: 1 %
Lymphocytes Relative: 23 %
Lymphs Abs: 2.4 10*3/uL (ref 0.7–4.0)
MCH: 28.7 pg (ref 26.0–34.0)
MCHC: 31 g/dL (ref 30.0–36.0)
MCV: 92.8 fL (ref 78.0–100.0)
MONO ABS: 0.9 10*3/uL (ref 0.1–1.0)
Monocytes Relative: 9 %
NEUTROS ABS: 6.5 10*3/uL (ref 1.7–7.7)
Neutrophils Relative %: 64 %
Platelets: 169 10*3/uL (ref 150–400)
RBC: 4.7 MIL/uL (ref 4.22–5.81)
RDW: 14.9 % (ref 11.5–15.5)
WBC: 10.1 10*3/uL (ref 4.0–10.5)

## 2017-11-27 LAB — GLUCOSE, CAPILLARY
Glucose-Capillary: 173 mg/dL — ABNORMAL HIGH (ref 70–99)
Glucose-Capillary: 129 mg/dL — ABNORMAL HIGH (ref 70–99)
Glucose-Capillary: 162 mg/dL — ABNORMAL HIGH (ref 70–99)
Glucose-Capillary: 132 mg/dL — ABNORMAL HIGH (ref 70–99)

## 2017-11-27 LAB — COMPREHENSIVE METABOLIC PANEL
ALK PHOS: 56 U/L (ref 38–126)
ALT: 12 U/L (ref 0–44)
AST: 18 U/L (ref 15–41)
Albumin: 3.6 g/dL (ref 3.5–5.0)
Anion gap: 8 (ref 5–15)
BUN: 19 mg/dL (ref 8–23)
CALCIUM: 9.5 mg/dL (ref 8.9–10.3)
CO2: 26 mmol/L (ref 22–32)
CREATININE: 1.44 mg/dL — AB (ref 0.61–1.24)
Chloride: 104 mmol/L (ref 98–111)
GFR calc non Af Amer: 49 mL/min — ABNORMAL LOW (ref >60)
GFR, EST AFRICAN AMERICAN: 57 mL/min — AB (ref >60)
Glucose, Bld: 142 mg/dL — ABNORMAL HIGH (ref 70–99)
Potassium: 4.1 mmol/L (ref 3.5–5.1)
Sodium: 138 mmol/L (ref 135–145)
TOTAL PROTEIN: 6.6 g/dL (ref 6.5–8.1)
Total Bilirubin: 0.9 mg/dL (ref 0.3–1.2)

## 2017-11-27 LAB — PHOSPHORUS: PHOSPHORUS: 3 mg/dL (ref 2.5–4.6)

## 2017-11-27 LAB — MAGNESIUM: Magnesium: 1.8 mg/dL (ref 1.7–2.4)

## 2017-11-27 MED ORDER — LIDOCAINE HCL 1 % IJ SOLN
INTRAMUSCULAR | Status: AC
  Filled 2017-11-27: qty 20

## 2017-11-27 MED ORDER — HEPARIN SOD (PORK) LOCK FLUSH 100 UNIT/ML IV SOLN
INTRAVENOUS | Status: AC
  Filled 2017-11-27: qty 5

## 2017-11-27 MED ORDER — VANCOMYCIN IV (FOR PTA / DISCHARGE USE ONLY): 1750.0000 mg | INTRAVENOUS | 0 refills | Status: AC

## 2017-11-27 MED ORDER — FENOFIBRATE 160 MG PO TABS
160.0000 mg | ORAL_TABLET | Freq: Every day | ORAL | Status: DC
  Administered 2017-11-27: 160 mg via ORAL
  Filled 2017-11-27: qty 1

## 2017-11-27 MED ORDER — LIDOCAINE HCL (PF) 1 % IJ SOLN
INTRAMUSCULAR | Status: AC | PRN
  Administered 2017-11-27 (×2): 10 mL

## 2017-11-27 NOTE — Plan of Care (Signed)
  Problem: Health Behavior/Discharge Planning: Goal: Ability to manage health-related needs will improve Outcome: Progressing   Problem: Activity: Goal: Risk for activity intolerance will decrease Outcome: Progressing   

## 2017-11-27 NOTE — Progress Notes (Signed)
Subjective:  Complaining with all the trouble that is happening with the PICC line  Antibiotics:  Anti-infectives (From admission, onward)   Start     Dose/Rate Route Frequency Ordered Stop   11/25/17 0500  vancomycin (VANCOCIN) 1,750 mg in sodium chloride 0.9 % 500 mL IVPB     1,750 mg 250 mL/hr over 120 Minutes Intravenous Every 24 hours 11/24/17 1342     11/24/17 1134  tobramycin (NEBCIN) injection  Status:  Discontinued       As needed 11/24/17 1135 11/24/17 1205   11/24/17 1127  vancomycin (VANCOCIN) powder  Status:  Discontinued       As needed 11/24/17 1128 11/24/17 1205   11/24/17 1110  tobramycin (NEBCIN) powder  Status:  Discontinued       As needed 11/24/17 1112 11/24/17 1205   11/24/17 0600  vancomycin (VANCOCIN) 1,500 mg in sodium chloride 0.9 % 500 mL IVPB  Status:  Discontinued     1,500 mg 250 mL/hr over 120 Minutes Intravenous Every 12 hours 11/23/17 1539 11/24/17 1342   11/23/17 1545  vancomycin (VANCOCIN) 2,000 mg in sodium chloride 0.9 % 500 mL IVPB     2,000 mg 250 mL/hr over 120 Minutes Intravenous  Once 11/23/17 1539 11/23/17 2244      Medications: Scheduled Meds: . apixaban  5 mg Oral BID  . cholecalciferol  4,000 Units Oral Daily  . fenofibrate  160 mg Oral Daily  . ibuprofen  400 mg Oral QHS  . insulin aspart  0-20 Units Subcutaneous TID WC  . insulin aspart  0-5 Units Subcutaneous QHS  . metoprolol tartrate  25 mg Oral BID  . sodium chloride flush  3 mL Intravenous Q12H   Continuous Infusions: . sodium chloride 10 mL/hr at 11/24/17 1332  . sodium chloride    . vancomycin 1,750 mg (11/27/17 0441)   PRN Meds:.sodium chloride, acetaminophen, albuterol, HYDROcodone-acetaminophen, oxyCODONE-acetaminophen, promethazine, sodium chloride flush, sodium chloride flush, traMADol, traZODone    Objective: Weight change:   Intake/Output Summary (Last 24 hours) at 11/27/2017 1209 Last data filed at 11/27/2017 0600 Gross per 24 hour  Intake 980  ml  Output -  Net 980 ml   Blood pressure 116/68, pulse (!) 107, temperature 97.7 F (36.5 C), temperature source Oral, resp. rate 16, height 6' (1.829 m), weight (!) 155.2 kg, SpO2 98 %. Temp:  [97.5 F (36.4 C)-98.4 F (36.9 C)] 97.7 F (36.5 C) (09/03 0525) Pulse Rate:  [49-107] 107 (09/03 0525) Resp:  [16-20] 16 (09/03 0525) BP: (116-133)/(68-72) 116/68 (09/03 0525) SpO2:  [97 %-98 %] 98 % (09/03 0525)  Physical Exam: General: Alert and awake, oriented x3, not in any acute distress. HEENT: anicteric sclera, EOMI CVS regular rate, normal  Chest: , no wheezing, no respiratory distress Abdomen: soft non-distended,  Extremities: no edema or deformity noted bilaterally Skin: no rashes, foot dressed Neuro: nonfocal  CBC:    BMET Recent Labs    11/26/17 0751 11/27/17 0436  NA 137 138  K 4.3 4.1  CL 107 104  CO2 22 26  GLUCOSE 137* 142*  BUN 20 19  CREATININE 1.41* 1.44*  CALCIUM 9.0 9.5     Liver Panel  Recent Labs    11/26/17 0751 11/27/17 0436  PROT 6.3* 6.6  ALBUMIN 3.2* 3.6  AST 17 18  ALT 13 12  ALKPHOS 48 56  BILITOT 0.6 0.9       Sedimentation Rate No results for input(s): ESRSEDRATE  in the last 72 hours. C-Reactive Protein No results for input(s): CRP in the last 72 hours.  Micro Results: Recent Results (from the past 720 hour(s))  WOUND CULTURE     Status: None   Collection Time: 11/21/17  1:49 PM  Result Value Ref Range Status   MICRO NUMBER: [id number]  Final   SPECIMEN QUALITY: ADEQUATE  Final   SOURCE: NOT GIVEN  Final   STATUS: FINAL  Final   GRAM STAIN:   Final    Rare Polymorphonuclear leukocytes Rare epithelial cells No organisms seen   RESULT: No Growth  Final  Culture, blood (routine x 2)     Status: None (Preliminary result)   Collection Time: 11/23/17  4:06 PM  Result Value Ref Range Status   Specimen Description BLOOD LEFT ARM  Final   Special Requests   Final    BOTTLES DRAWN AEROBIC AND ANAEROBIC Blood Culture  adequate volume   Culture   Final    NO GROWTH 3 DAYS Performed at [facility], [address] [place], [place] [address]    Report Status PENDING  Incomplete  Culture, blood (routine x 2)     Status: None (Preliminary result)   Collection Time: 11/23/17  4:09 PM  Result Value Ref Range Status   Specimen Description BLOOD RIGHT HAND  Final   Special Requests   Final    BOTTLES DRAWN AEROBIC AND ANAEROBIC Blood Culture adequate volume   Culture   Final    NO GROWTH 3 DAYS Performed at [facility], [address] [place], [place] [address]    Report Status PENDING  Incomplete  Surgical pcr screen     Status: None   Collection Time: 11/24/17  5:12 AM  Result Value Ref Range Status   MRSA, PCR NEGATIVE NEGATIVE Final   Staphylococcus aureus NEGATIVE NEGATIVE Final    Comment: (NOTE) The Xpert SA Assay (FDA approved for NASAL specimens in patients 76 years of age and older), is one component of a comprehensive surveillance program. It is not intended to diagnose infection nor to guide or monitor treatment. Performed at [facility], [address] [place], [place] [address]   Aerobic/Anaerobic Culture (surgical/deep wound)     Status: None (Preliminary result)   Collection Time: 11/24/17 11:13 AM  Result Value Ref Range Status   Specimen Description ABSCESS  Final   Special Requests NONE  Final   Gram Stain NO WBC SEEN NO ORGANISMS SEEN   Final   Culture   Final    NO GROWTH 3 DAYS NO ANAEROBES ISOLATED; CULTURE IN PROGRESS FOR 5 DAYS Performed at [facility], [address], [place], [place] [address]    Report Status PENDING  Incomplete    Studies/Results: Dg Chest Port 1 View  Result Date: 11/25/2017 CLINICAL DATA:  67 year old male status post PICC placement. EXAM: PORTABLE CHEST 1 VIEW COMPARISON:  Chest x-ray 11/25/2017. FINDINGS: Previously noted right-sided PICC is not identified on today's examination, and has presumably been withdrawn.  Lung volumes are normal. No consolidative airspace disease. No pleural effusions. No pneumothorax. No pulmonary nodule or mass noted. Pulmonary vasculature and the cardiomediastinal silhouette are within normal limits. IMPRESSION: 1. No radiographic evidence of acute cardiopulmonary disease. 2. Withdrawal of previously noted right-sided PICC. Electronically Signed   By: Trudie Reed M.D.   On: 11/25/2017 18:39   Dg Chest Port 1 View  Result Date: 11/25/2017 CLINICAL DATA:  PICC line placement  EXAM: PORTABLE CHEST 1 VIEW COMPARISON:  None. FINDINGS: PICC line from a RIGHT upper arm access enters the RIGHT subclavian vein region but loops back into the RIGHT axilla Lungs are clear.  No pulmonary edema or pneumothorax. IMPRESSION: Malpositioned PICC line. PICC line enters the RIGHT subclavian region and loops back into the RIGHT axilla. These results will be called to the ordering clinician or representative by the Radiologist Assistant, and communication documented in the PACS or zVision Dashboard. Electronically Signed   By: Genevive Bi M.D.   On: 11/25/2017 15:02   [place] Ekg Site Rite  Result Date: 11/25/2017 If Site Rite image not attached, placement could not be confirmed due to current cardiac rhythm.     Assessment/Plan:  INTERVAL HISTORY: Her operative cultures remain negative pathology still pending  Principal Problem:   Cellulitis and abscess of left foot Active Problems:   Severe obesity (BMI >= 40) (HCC)   OSA on CPAP   Essential hypertension   DM (diabetes mellitus), type 2, uncontrolled w/neurologic complication (HCC)   Unspecified atrial fibrillation (HCC)    William Cameron is a 67 y.o. male with MSSA osteomyelitis associated with hardware status post treatment with Zyvox with failure and need for repeat surgery and removal of hardware with no organisms growing on culture.  Note the patient has a severe penicillin allergy making [place] apprehensive of has writing challenges  with any other beta lactams.  He has responded well to removal of his hardware and to IV vancomycin.  I would err on the side of being aggressive and treat him with 6 weeks of IV vancomycin.  I will put in O PAT order and arrange for hospital follow-up in our clinic.  I will follow-up cultures in the interim.   LOS: 4 days   Acey Lav 11/27/2017, 12:09 PM

## 2017-11-27 NOTE — Progress Notes (Signed)
Patient was given discharge instructions and verbalized understanding. Patient has a right upper arm PICC, clean, dry and intact for antibiotic use. Patient and spouse received education. Patient left unit in stable condition.

## 2017-11-27 NOTE — Progress Notes (Addendum)
   Patient Status: [facility] - In-pt  Assessment and Plan: Patient in need of venous access.   Peripherally inserted central catheter placement  ______________________________________________________________________   History of Present Illness: William Cameron is a 67 y.o. male   Cellulitis post left foot surgery 09/06/2017 Removal of hardware and placement of antibiotic beads 11/24/17 Need for long term antibiotics PICC team attempted x 2--- unsuccessful placement - Malposition  Cr 1.4 On Eliquis Discussed Cr level with MD-- would like to move ahead with PICC in IR Had been ordered as peripheral central catheter originally by ID and pt is on Eliquis    Allergies and medications reviewed.   Review of Systems: A 12 point ROS discussed and pertinent positives are indicated in the HPI above.  All other systems are negative.    Vital Signs: BP 116/68 (BP Location: Left Arm)   Pulse (!) 107   Temp 97.7 F (36.5 C) (Oral)   Resp 16   Ht 6' (1.829 m)   Wt (!) 342 lb 2.5 oz (155.2 kg)   SpO2 98%   BMI 46.40 kg/m   Physical Exam  Constitutional: He is oriented to person, place, and time.  Musculoskeletal:  Left foot healing wound  Neurological: He is alert and oriented to person, place, and time.  Skin: Skin is warm.  Psychiatric: He has a normal mood and affect. His behavior is normal. Judgment and thought content normal.     Imaging reviewed.   Labs:  COAGS: No results for input(s): INR, APTT in the last 8760 hours.  BMP: Recent Labs    11/24/17 0443 11/25/17 0552 11/26/17 0751 11/27/17 0436  NA 135 135 137 138  K 4.3 4.6 4.3 4.1  CL 103 106 107 104  CO2 25 22 22 26   GLUCOSE 131* 176* 137* 142*  BUN 32* 26* 20 19  CALCIUM 9.8 8.9 9.0 9.5  CREATININE 1.72* 1.54* 1.41* 1.44*  GFRNONAA 39* 45* 50* 49*  GFRAA 46* 52* 58* 57*    Pt is aware of Peripherally inserted central catheter placement to be performed in IR today. Understand procedure benefits and  risks including but not limited to: Infection; bleeding; vessel damage; need for possible IJ placement Agreeable to proceed' Consent signed and in chart   Electronically Signed: Deniz Hannan A, PA-C 11/27/2017, 8:13 AM   I spent a total of 15 minutes in face to face in clinical consultation, greater than 50% of which was counseling/coordinating care for venous access.Patient ID: William Cameron, male   DOB: 1950-10-24, 67 y.o.   MRN: [id number]

## 2017-11-27 NOTE — Discharge Instructions (Signed)

## 2017-11-27 NOTE — Discharge Summary (Signed)
Physician Discharge Summary  William Cameron HFW:[id number] DOB: [birth year] DOA: 11/23/2017  PCP: Jilda Panda, MD  Admit date: 11/23/2017 Discharge date: 11/27/2017  Admitted From: Home Disposition: Home  Recommendations for Outpatient Follow-up:  1. Follow up with PCP in 1-2 weeks 2. Follow up with Podiatry within 1 week of Discharge 3. Follow up with Infectious Diseases within 6 weeks and continue IV Vancomycin for 6 weeks 4. Please obtain CMP/CBC, Mag, Phos in one week 5. Bi-Weekly Labs on IV Abx of BMP, CBC with Diff, ESR, CRP and Vanc Trough and Fax Labs to [phone] 6. Please follow up on the following pending results:  Home Health: Yes Equipment/Devices: PICC Line   Discharge Condition: Stable CODE STATUS: FULL CODE  Diet recommendation: Heart Healthy Carb Modified Diet  Brief/Interim Summary: The patient is a 67 y.o.malewith medical history significant forL foot cellulitis/abscess, uncontrolled DM2, CVA, OSA, HTN, morbid obesity who underwent L foot surgery in mid June by Dr. Amalia Hailey (podiatry) who subsequently developed an abscess of the incision site which his son drained with a sterilized needle. Afterwards swelling and erythema improved and he took some leftover levaquin 750 mg BID. He presented to Dr. Amalia Hailey' office on 8/28 and cultures were drawn which ultimately grew MRSA. Plan was to remove screw on 9/11 with debridement and his LVQ was refilled; however, pt felt foot wound was getting worse, with increased pain and swelling and erythema. There aws serous fluid now draining from the top of his foot. He has had no documented fevers, no night sweats but overall has felt worse and has had increased weakness.Cultures from 8/28 were negative.He was directly admitted with the plan of getting a PICC and IV vanc as well as more immediate surgical treatment and patient was taken to surgery 11/24/17 by Dr. Amalia Hailey. Dr. Megan Salon of ID evaluated and recommended PICC Placement and  continuing IV Vancomycin pending Final Cx's.  PICC line had issues and was malpositioned on both attempts and unfortunately are unavailable to exchange yesterday and because PICC line was hurting had to be removed.  Patient underwent IR placed PICC line this AM and OPAT orders placed of IV Vancomycin for at least 6 weeks. It was discussed with the patient about Tunneled Catheter as opposed to PICC line and Patient refused Tunneled Catheter opting for PICC knowing risk of lack of access in the case of ever needing Dialysis. He was evaluated by Podiatry who recommended follow up within 1 week.  He was deemed medically stable to be discharged home and will need to keep his incision clean dry and intact until follow-up for 1 week.  He will need to follow-up with primary care physician as well as infectious diseases and podiatry in the outpatient setting.  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 Re-done by IR  This AM and deemed medically stable to be D/C'd Home  -Checked CRP and was <0.8 -Continue monitor CBC, temp, vitals -S/p I and D, removal of screws, bone bx L foot tomorrow morning. Plan is also to place antibiotic beads; Patient is POD 3   -F/u blood cultures, operative cultures -Appreciate podiatry and ID assistance -Resumed Eliquis -Patient is Afebrile and has no Leukocytosis  -Follow up on Podiatry and ID Recc's and follow up with Podiatry with 1 week and ID within 6 weeks   DM2 -Not on Home Insulin -Held home oral hypoglycemics but will resume at D/C  -Resistant Novolog SSI AC/HS while hospitalized  -CBG's ranging from 129-163 -Last HbA1c was 7.6 -carb consistent diet after Surgery  Essential HTN -Pt states his BP has been lower than usual for past 2 weeks; it is on the lower side here and last reading was at goal at 103/54 -Held amlodipine and benazepril for now but resume at D/C after evaluation by PCP  -Continue metoprolol given h/o AF; will change from long-acting to short acting in case BP drops  H/o AF, on chronic anticoagulation -cont BB (changes as above) -Held Eliquis for surgery but ok to resume   OSA -Cont CPAP per home settings  AKI on CKD Stage 3 -Baseline CR ranging from 1.3-1.5 -Cr was 1.72 during admission -Start Gentle Fluid Hydration with NS at a rate of 75 mL/hr x 20 hours and repeated yesterday and BUN/Cr improved to 19/1.44 -IVF Hydration now stiopped due to lack of IV Access and will encourage po intake at D/C  -Avoid Nephrotoxic Medications if possible (Will be on Vancomycin per ID notes) and will hold Benazepril -Continue to Monitor and Trend CMP as an outpatient   COPD/Chronic Bronchitis -Currently not in Exacerbation -Added Albuterol 2.5 mg Neb q6hprn Wheezing and SOB  HLD -C/w Fenofibrate 108 mg po Daily   Severe Morbid Obesity -Estimated body mass index is 46.4 kg/m as calculated from the following:   Height as of this encounter: 6' (1.829 m).   Weight as of this encounter: 155.2 kg.  -Weight Loss Counseling Given   Normocytic Anemia -Hb/Hct went from 14.6/46.4 -> 13.5/43.6 and suspect a slight dilutional component  -Likely in the setting of Chronic Kidney  Disease -Continue to Monitor for S/Sx of Bleeding -Repeat CBC as an outpatient   Discharge Instructions  Discharge Instructions    Call MD for:  difficulty breathing, headache or visual disturbances   Complete by:  As directed    Call MD for:  extreme fatigue   Complete by:  As directed    Call MD for:  hives   Complete by:  As directed    Call MD for:  persistant dizziness or light-headedness   Complete by:  As directed    Call MD for:  persistant nausea and vomiting   Complete by:  As directed    Call MD for:  redness, tenderness, or signs of infection (pain, swelling, redness, odor or green/yellow discharge around incision site)   Complete by:  As directed    Call MD for:  severe uncontrolled pain   Complete by:  As directed    Call MD for:  temperature >100.4   Complete by:  As directed    Diet - low sodium heart healthy   Complete by:  As directed    Diet Carb  Modified   Complete by:  As directed    Discharge instructions   Complete by:  As directed    You were cared for by a hospitalist during your hospital stay. If you have any questions about your discharge medications or the care you received while you were in the hospital after you are discharged, you can call the unit and ask to speak with the hospitalist on call if the hospitalist that took care of you is not available. Once you are discharged, your primary care physician will handle any further medical issues. Please note that NO REFILLS for any discharge medications will be authorized once you are discharged, as it is imperative that you return to your primary care physician (or establish a relationship with a primary care physician if you do not have one) for your aftercare needs so that they can reassess your need for medications and monitor your lab values.  Follow up with PCP, Podiatry, and Infectious Diseases as an outpatient. Take all medications as prescribed. If symptoms change or worsen please return to the ED  for evaluation   Home infusion instructions [facility] May follow [place] Dosing Protocol; May administer Cathflo as needed to maintain patency of vascular access device.; Flushing of vascular access device: per [facility] Protocol: 0.9% NaCl pre/post medica...   Complete by:  As directed    Instructions:  May follow [place] Dosing Protocol   Instructions:  May administer Cathflo as needed to maintain patency of vascular access device.   Instructions:  Flushing of vascular access device: per [facility] Protocol: 0.9% NaCl pre/post medication administration and prn patency; Heparin 100 u/ml, 49m for implanted ports and Heparin 10u/ml, 559mfor all other central venous catheters.   Instructions:  May follow [facility] Anaphylaxis Protocol for First Dose Administration in the home: 0.9% NaCl at 25-50 ml/hr to maintain IV access for protocol meds. Epinephrine 0.3 ml IV/IM PRN and Benadryl 25-50 IV/IM PRN s/s of anaphylaxis.   Instructions:  [facility] Coordinator (RN) to assist per patient IV care needs in the home PRN.   Increase activity slowly   Complete by:  As directed      Allergies as of 11/27/2017      Reactions   Penicillins Anaphylaxis   Has patient had a PCN reaction causing immediate rash, facial/tongue/throat swelling, SOB or lightheadedness with hypotension: Yes Has patient had a PCN reaction causing severe rash involving mucus membranes or skin necrosis: Yes Has patient had a PCN reaction that required hospitalization Yes Has patient had a PCN reaction occurring within the last 10 years: No If all of the above answers are "NO", then may proceed with Cephalosporin use.   Tetanus Toxoids Anaphylaxis, Shortness Of Breath, Swelling   Sulfa Antibiotics Nausea And Vomiting, Swelling      Medication List    STOP taking these medications   levofloxacin 750 MG tablet Commonly known as:  LEVAQUIN     TAKE these medications   amLODipine 5 MG tablet Commonly known as:   NORVASC Take 5 mg by mouth daily.   benazepril 40 MG tablet Commonly known as:  LOTENSIN Take 40 mg by mouth daily after supper.   ELIQUIS 5 MG Tabs tablet Generic drug:  apixaban Take 5 mg by mouth 2 (two) times daily.   FARXIGA 5 MG Tabs tablet Generic drug:  dapagliflozin propanediol Take 5 mg by mouth 2 (two) times daily.   fenofibrate micronized 130 MG capsule Commonly known as:  ANTARA Take 130 mg by mouth daily after supper.   ibuprofen 200 MG tablet Commonly known as:  ADVIL,MOTRIN Take 400 mg by mouth at bedtime.   metoprolol succinate 50 MG 24 hr tablet Commonly known as:  TOPROL-XL Take 50 mg by mouth daily after supper. Take with or immediately following a meal.   PRESCRIPTION MEDICATION Inhale into the lungs at bedtime. CPAP   traMADol 50 MG tablet Commonly known as:  ULTRAM Take one tablet every 8 hours prn foot pain. What changed:    how much to take  how to take this  when to take this  reasons to take this  additional instructions   vancomycin  IVPB Inject 1,750 mg into the vein daily. Indication:  Hardware associated osteomyelitis of left foot  Last Day of Therapy:  01/07/18 Labs - Sunday/Monday:  CBC/D, BMP, and vancomycin trough. Labs - Thursday:  BMP and vancomycin trough Labs - Every other week:  ESR and CRP   Vitamin D 2000 units tablet Take 4,000 Units by mouth daily.            Home Infusion Instuctions  (From admission, onward)         Start     Ordered   11/27/17 0000  Home infusion instructions [facility] May follow [facility] Dosing Protocol; May administer Cathflo as needed to maintain patency of vascular access device.; Flushing of vascular access device: per [facility] Protocol: 0.9% NaCl pre/post medica...    Question Answer Comment  Instructions May follow [facility] Dosing Protocol   Instructions May administer Cathflo as needed to maintain patency of vascular access device.   Instructions Flushing of  vascular access device: per [facility] Protocol: 0.9% NaCl pre/post medication administration and prn patency; Heparin 100 u/ml, 5ml for implanted ports and Heparin 10u/ml, 5ml for all other central venous catheters.   Instructions May follow [facility] Anaphylaxis Protocol for First Dose Administration in the home: 0.9% NaCl at 25-50 ml/hr to maintain IV access for protocol meds. Epinephrine 0.3 ml IV/IM PRN and Benadryl 25-50 IV/IM PRN s/s of anaphylaxis.   Instructions [facility] Infusion Coordinator (RN) to assist per patient IV care needs in the home PRN.      09 /03/19 1646         Follow-up Information    [facility]-Home Follow up.   Specialty:  [place] Why:  following for home IV abx Contact information: [address] [place] [address] [phone]        Jilda Panda, MD. Call.   Specialty:  Internal Medicine Why:  Follow up within 1 week  Contact information: [address] [address] [phone]        Edrick Kins, DPM. Call.   Specialty:  Podiatry Why:  Follow up within 1 week  Contact information: [place] [address] [phone]        Livingston Callas, NP. Call.   Specialty:  Infectious Diseases Why:  Follow up with ID within 1 week Contact information: [place] [address] [phone]          Allergies  Allergen Reactions  . Penicillins Anaphylaxis    Has patient had a PCN reaction causing immediate rash, facial/tongue/throat swelling, SOB or lightheadedness with hypotension: Yes Has patient had a PCN reaction causing severe rash involving mucus membranes or skin necrosis: Yes Has patient had a PCN reaction that required hospitalization Yes  Has patient had a PCN reaction occurring within the last 10 years: No If all of the above answers are "NO", then may proceed with Cephalosporin use.  . Tetanus Toxoids Anaphylaxis, Shortness Of Breath and  Swelling  . Sulfa Antibiotics Nausea And Vomiting and Swelling   Consultations:  Infectious Diseases  Podiatry  Interventional Radiology  Procedures/Studies: Dg Chest Port 1 View  Result Date: 11/25/2017 CLINICAL DATA:  67 year old male status post PICC placement. EXAM: PORTABLE CHEST 1 VIEW COMPARISON:  Chest x-ray 11/25/2017. FINDINGS: Previously noted right-sided PICC is not identified on today's examination, and has presumably been withdrawn. Lung volumes are normal. No consolidative airspace disease. No pleural effusions. No pneumothorax. No pulmonary nodule or mass noted. Pulmonary vasculature and the cardiomediastinal silhouette are within normal limits. IMPRESSION: 1. No radiographic evidence of acute cardiopulmonary disease. 2. Withdrawal of previously noted right-sided PICC. Electronically Signed   By: Vinnie Langton M.D.   On: 11/25/2017 18:39   Dg Chest Port 1 View  Result Date: 11/25/2017 CLINICAL DATA:  PICC line placement EXAM: PORTABLE CHEST 1 VIEW COMPARISON:  None. FINDINGS: PICC line from a RIGHT upper arm access enters the RIGHT subclavian vein region but loops back into the RIGHT axilla Lungs are clear.  No pulmonary edema or pneumothorax. IMPRESSION: Malpositioned PICC line. PICC line enters the RIGHT subclavian region and loops back into the RIGHT axilla. These results will be called to the ordering clinician or representative by the Radiologist Assistant, and communication documented in the PACS or zVision Dashboard. Electronically Signed   By: Suzy Bouchard M.D.   On: 11/25/2017 15:02   Dg Foot Complete Left  Result Date: 11/24/2017 CLINICAL DATA:  Postop EXAM: LEFT FOOT - COMPLETE 3+ VIEW COMPARISON:  11/12/2017 FINDINGS: Two screws in the first metatarsal have been removed. Antibiotic beads are present. There is bone destruction and apparent osteomyelitis of the first metatarsal. Osteotomy of the second, third, and fourth proximal phalanges for prior hammertoe  repair. IMPRESSION: Removal of hardware in the first metatarsal with antibiotic bead placement. Bony defect related to infection. Electronically Signed   By: Franchot Gallo M.D.   On: 11/24/2017 15:45   Dg Foot Complete Left  Result Date: 11/12/2017 Please see detailed radiograph report in office note.  [place] Ekg Site Rite  Result Date: 11/25/2017 If Site Rite image not attached, placement could not be confirmed due to current cardiac rhythm.  [place] Ekg Site Rite  Result Date: 11/23/2017 If Site Rite image not attached, placement could not be confirmed due to current cardiac rhythm.    Subjective: Seen and examined at bedside and is doing well and no other complaints. No chest pain, shortness breath, nausea, vomiting.  States his foot is improved.  No other surgical residents time is ready to go home  Discharge Exam: Vitals:   11/27/17 0525 11/27/17 1342  BP: 116/68 103/64  Pulse: (!) 107 (!) 46  Resp: 16 16  Temp: 97.7 F (36.5 C) 98.6 F (37 C)  SpO2: 98% 100%   Vitals:   11/26/17 1344 11/26/17 2059 11/27/17 0525 11/27/17 1342  BP: 123/72 133/71 116/68 103/64  Pulse: (!) 49 (!) 52 (!) 107 (!) 46  Resp: 20 16 16 16   Temp: (!) 97.5 F (36.4 C) 98.4 F (36.9 C) 97.7 F (36.5 C) 98.6 F (37 C)  TempSrc: Oral Oral Oral Oral  SpO2: 97% 97% 98% 100%  Weight:      Height:       General: Pt is alert,  awake, not in acute distress Cardiovascular: RRR, S1/S2 +, no rubs, no gallops Respiratory: Diminished bilaterally, no wheezing, no rhonchi Abdominal: Soft, NT, Distended 2/2 body habitus, bowel sounds + Extremities: Left Foot wrapped, no cyanosis  The results of significant diagnostics from this hospitalization (including imaging, microbiology, ancillary and laboratory) are listed below for reference.    Microbiology: Recent Results (from the past 240 hour(s))  WOUND CULTURE     Status: None   Collection Time: 11/21/17  1:49 PM  Result Value Ref Range Status   MICRO NUMBER:  [id number]  Final   SPECIMEN QUALITY: ADEQUATE  Final   SOURCE: NOT GIVEN  Final   STATUS: FINAL  Final   GRAM STAIN:   Final    Rare Polymorphonuclear leukocytes Rare epithelial cells No organisms seen   RESULT: No Growth  Final  Culture, blood (routine x 2)     Status: None (Preliminary result)   Collection Time: 11/23/17  4:06 PM  Result Value Ref Range Status   Specimen Description BLOOD LEFT ARM  Final   Special Requests   Final    BOTTLES DRAWN AEROBIC AND ANAEROBIC Blood Culture adequate volume   Culture   Final    NO GROWTH 4 DAYS Performed at [facility], [address] [place], [place] [address]    Report Status PENDING  Incomplete  Culture, blood (routine x 2)     Status: None (Preliminary result)   Collection Time: 11/23/17  4:09 PM  Result Value Ref Range Status   Specimen Description BLOOD RIGHT HAND  Final   Special Requests   Final    BOTTLES DRAWN AEROBIC AND ANAEROBIC Blood Culture adequate volume   Culture   Final    NO GROWTH 4 DAYS Performed at [facility], [place] [address] [place], [place] [address]    Report Status PENDING  Incomplete  Surgical pcr screen     Status: None   Collection Time: 11/24/17  5:12 AM  Result Value Ref Range Status   MRSA, PCR NEGATIVE NEGATIVE Final   Staphylococcus aureus NEGATIVE NEGATIVE Final    Comment: (NOTE) The Xpert SA Assay (FDA approved for NASAL specimens in patients 61 years of age and older), is one component of a comprehensive surveillance program. It is not intended to diagnose infection nor to guide or monitor treatment. Performed at [facility], [place] [address], [place], [place] [address]   Aerobic/Anaerobic Culture (surgical/deep wound)     Status: None (Preliminary result)   Collection Time: 11/24/17 11:13 AM  Result Value Ref Range Status   Specimen Description ABSCESS  Final   Special Requests NONE  Final   Gram Stain NO WBC SEEN NO ORGANISMS SEEN   Final   Culture   Final    NO  GROWTH 3 DAYS NO ANAEROBES ISOLATED; CULTURE IN PROGRESS FOR 5 DAYS Performed at [facility], [address], [place],  [address]    Report Status PENDING  Incomplete    Labs: BNP (last 3 results) No results for input(s): BNP in the last 8760 hours. Basic Metabolic Panel: Recent Labs  Lab 11/23/17 1606 11/24/17 0443 11/25/17 0552 11/26/17 0751 11/27/17 0436  NA 136 135 135 137 138  K 4.0 4.3 4.6 4.3 4.1  CL 103 103 106 107 104  CO2 22 25 22 22 26   GLUCOSE 143* 131* 176* 137* 142*  BUN 36* 32* 26* 20 19  CREATININE 1.68* 1.72* 1.54* 1.41* 1.44*  CALCIUM 10.0 9.8 8.9 9.0 9.5  MG  --   --  2.1 1.8 1.8  PHOS  --   --  3.1 2.5 3.0   Liver Function Tests: Recent Labs  Lab 11/23/17 1606 11/25/17 0552 11/26/17 0751 11/27/17 0436  AST 23 17 17 18   ALT 14 14 13 12   ALKPHOS 57 51 48 56  BILITOT 1.0 0.8 0.6 0.9  PROT 7.5 6.4* 6.3* 6.6  ALBUMIN 4.1 3.4* 3.2* 3.6   No results for input(s): LIPASE, AMYLASE in the last 168 hours. No results for input(s): AMMONIA in the last 168 hours. CBC: Recent Labs  Lab 11/23/17 1606 11/24/17 0443 11/25/17 0552 11/26/17 0751 11/27/17 0436  WBC 8.2 7.2 9.5 7.4 10.1  NEUTROABS 5.4  --  7.5 3.9 6.5  HGB 14.8 14.6 12.4* 12.4* 13.5  HCT 45.7 46.4 39.5 39.9 43.6  MCV 90.7 91.7 91.4 92.4 92.8  PLT 239 219 199 165 169   Cardiac Enzymes: No results for input(s): CKTOTAL, CKMB, CKMBINDEX, TROPONINI in the last 168 hours. BNP: Invalid input(s): POCBNP CBG: Recent Labs  Lab 11/26/17 1222 11/26/17 1724 11/26/17 2059 11/27/17 0812 11/27/17 1238  GLUCAP 141* 117* 163* 129* 162*   D-Dimer No results for input(s): DDIMER in the last 72 hours. Hgb A1c No results for input(s): HGBA1C in the last 72 hours. Lipid Profile No results for input(s): CHOL, HDL, LDLCALC, TRIG, CHOLHDL, LDLDIRECT in the last 72 hours. Thyroid function studies No results for input(s): TSH, T4TOTAL, T3FREE, THYROIDAB in the last 72 hours.  Invalid  input(s): FREET3 Anemia work up No results for input(s): VITAMINB12, FOLATE, FERRITIN, TIBC, IRON, RETICCTPCT in the last 72 hours. Urinalysis    Component Value Date/Time   COLORURINE YELLOW 10/08/2017 1420   APPEARANCEUR CLEAR 10/08/2017 1420   LABSPEC 1.027 10/08/2017 1420   PHURINE 5.0 10/08/2017 1420   GLUCOSEU >=500 (A) 10/08/2017 1420   HGBUR NEGATIVE 10/08/2017 1420   BILIRUBINUR NEGATIVE 10/08/2017 1420   KETONESUR NEGATIVE 10/08/2017 1420   PROTEINUR NEGATIVE 10/08/2017 1420   UROBILINOGEN 0.2 07/08/2014 0845   NITRITE NEGATIVE 10/08/2017 1420   LEUKOCYTESUR NEGATIVE 10/08/2017 1420   Sepsis Labs Invalid input(s): PROCALCITONIN,  WBC,  LACTICIDVEN Microbiology Recent Results (from the past 240 hour(s))  WOUND CULTURE     Status: None   Collection Time: 11/21/17  1:49 PM  Result Value Ref Range Status   MICRO NUMBER: [id number]  Final   SPECIMEN QUALITY: ADEQUATE  Final   SOURCE: NOT GIVEN  Final   STATUS: FINAL  Final   GRAM STAIN:   Final    Rare Polymorphonuclear leukocytes Rare epithelial cells No organisms seen   RESULT: No Growth  Final  Culture, blood (routine x 2)     Status: None (Preliminary result)   Collection Time: 11/23/17  4:06 PM  Result Value Ref Range Status   Specimen Description BLOOD LEFT ARM  Final   Special Requests   Final    BOTTLES DRAWN AEROBIC AND ANAEROBIC Blood Culture adequate volume   Culture   Final    NO GROWTH 4 DAYS Performed at [facility], [address], [place], [place] [address]    Report Status PENDING  Incomplete  Culture, blood (routine x 2)     Status: None (Preliminary result)   Collection Time: 11/23/17  4:09 PM  Result Value Ref Range Status   Specimen Description BLOOD RIGHT HAND  Final   Special Requests   Final    BOTTLES  DRAWN AEROBIC AND ANAEROBIC Blood Culture adequate volume   Culture   Final    NO GROWTH 4 DAYS Performed at [facility], [place] [address] [place], [place] [address]     Report Status PENDING  Incomplete  Surgical pcr screen     Status: None   Collection Time: 11/24/17  5:12 AM  Result Value Ref Range Status   MRSA, PCR NEGATIVE NEGATIVE Final   Staphylococcus aureus NEGATIVE NEGATIVE Final    Comment: (NOTE) The Xpert SA Assay (FDA approved for NASAL specimens in patients 15 years of age and older), is one component of a comprehensive surveillance program. It is not intended to diagnose infection nor to guide or monitor treatment. Performed at [facility], [place] [address], [place], [place] [address]   Aerobic/Anaerobic Culture (surgical/deep wound)     Status: None (Preliminary result)   Collection Time: 11/24/17 11:13 AM  Result Value Ref Range Status   Specimen Description ABSCESS  Final   Special Requests NONE  Final   Gram Stain NO WBC SEEN NO ORGANISMS SEEN   Final   Culture   Final    NO GROWTH 3 DAYS NO ANAEROBES ISOLATED; CULTURE IN PROGRESS FOR 5 DAYS Performed at [facility], [address], [place], [place] [id number]    Report Status PENDING  Incomplete   Time coordinating discharge: 35 minutes  SIGNED:  Kerney Elbe, DO [facility] 11/27/2017, 4:49 PM Pager is on [place]  If 7PM-7AM, please contact night-coverage [email or web address] Password [email or web address]

## 2017-11-27 NOTE — Procedures (Signed)
PROCEDURE SUMMARY:  Successful placement of image-guided single lumen PICC line to the right basilic vein. Length 43 cm. Tip at lower SVC/RA. No complications. Ready for use.   Villa Herb PA-C 11/27/2017 4:35 PM

## 2017-11-27 NOTE — Progress Notes (Signed)
PHARMACY CONSULT NOTE FOR:  OUTPATIENT  PARENTERAL ANTIBIOTIC THERAPY (OPAT)  Indication: Hardware associated osteomyelitis of left foot Regimen: Vancomycin 1750 mg every 24 hours End date: 01/07/18  IV antibiotic discharge orders are pended. To discharging provider:  please sign these orders via discharge navigator,  Select New Orders & click on the button choice - Manage This Unsigned Work.     Thank you for allowing pharmacy to be a part of this patient's care.  Della Goo, PharmD, BCPS Infectious Diseases Clinical Pharmacist Phone: [phone] 11/27/2017, 12:15 PM

## 2017-11-27 NOTE — Progress Notes (Signed)
   Subjective:  Patient presents today status post removal of hardware with incision and drainage and placement of absorbable antibiotic beads left foot.. DOS: 11/24/2017.  Patient has been dealing with postoperative infection since original surgery on 09/06/2017.  Patient admitted to the hospital for cellulitis with abscess on 11/23/2017.  Patient has had 2 unsuccessful PICC line placements.  Apparently he is scheduled for third PICC line placement today, 11/27/2017.  Patient otherwise in good spirits.  He states that his foot feels very well.  Past Medical History:  Diagnosis Date  . Bronchitis    chronic bronchitis  . COPD (chronic obstructive pulmonary disease) (HCC)   . Diabetes mellitus without complication (HCC)   . Elevated lactic acid level   . Hypertension   . PONV (postoperative nausea and vomiting)    post op nausea  . 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   . Sleep apnea    cpap at night       Objective/Physical Exam Neurovascular status intact.  Skin incisions appear to be well coapted with staples intact. Significantly improved erythema and edema since prior to surgery. No dehiscence. No active bleeding noted.   Radiographic Exam postoperative 11/24/2017:  Removal of hardware in the first metatarsal with antibiotic bead placement. Bony defect related to infection.  Assessment: - s/p left foot I&D w/ ROH and placement of antibiotic beads. DOS: 11/24/2017  -From a surgical standpoint patient is okay to be discharged pending PICC line placement and arrangement of long-term IV antibiotics.  Culture and bone biopsy results demonstrate no growth x2 days. - Dressings changed today.  Keep clean dry and intact until follow-up visit in the office within 1 week of discharge. - Podiatry will sign off for now.  Follow-up in office - Heel touch only weight bearing in  postop shoe or CAM boot.     Felecia Shelling, DPM [facility]  Dr. Felecia Shelling, DPM    [address]                                        [place], [place] [address]                Office [phone]  Fax [phone]

## 2017-11-27 NOTE — Progress Notes (Signed)
Diagnosis: Hardware associated osteomyelitis   Culture Result: MSSA  Allergies  Allergen Reactions  . Penicillins Anaphylaxis    Has patient had a PCN reaction causing immediate rash, facial/tongue/throat swelling, SOB or lightheadedness with hypotension: Yes Has patient had a PCN reaction causing severe rash involving mucus membranes or skin necrosis: Yes Has patient had a PCN reaction that required hospitalization Yes Has patient had a PCN reaction occurring within the last 10 years: No If all of the above answers are "NO", then may proceed with Cephalosporin use.  . Tetanus Toxoids Anaphylaxis, Shortness Of Breath and Swelling  . Sulfa Antibiotics Nausea And Vomiting and Swelling    OPAT Orders Discharge antibiotics: Vancomycin Per pharmacy protocol Vancomycin Aim for Vancomycin trough 15-20 (unless otherwise indicated) Duration: 6 weeks  End Date: 01/07/18  [facility] Care Per Protocol:  BWEEKLY LABS while on IV antibiotics Labs :  x__ BMP   weekly while on IV antibiotics _x_ CBC with differential _x_ CRP _x_ ESR x__ Vancomycin trough  x__ Please pull PIC at completion of IV antibiotics __ Please leave PIC in place until doctor has seen patient or been notified  Fax weekly labs to [phone]  Clinic Follow Up Appt:  Next 4 weeks

## 2017-11-28 ENCOUNTER — Telehealth: Payer: Self-pay | Admitting: Podiatry

## 2017-11-28 ENCOUNTER — Telehealth: Payer: Self-pay | Admitting: *Deleted

## 2017-11-28 LAB — CULTURE, BLOOD (ROUTINE X 2)
Culture: NO GROWTH
Culture: NO GROWTH
Special Requests: ADEQUATE
Special Requests: ADEQUATE

## 2017-11-28 NOTE — Telephone Encounter (Signed)
I called and canceled William Cameron surgery that was scheduled for 12/05/17.  He had an inpatient surgery on 11/24/2017 at [facility].

## 2017-11-28 NOTE — Telephone Encounter (Signed)
I called and was speaking to [name] to get her husband scheduled for an appointment with Dr. Logan Bores this coming Monday for a postop appointment where [name] had surgery this past Saturday. The phone then clicked off so I do not know if the call was dropped or if I was hung up on.

## 2017-11-29 LAB — AEROBIC/ANAEROBIC CULTURE W GRAM STAIN (SURGICAL/DEEP WOUND)
Culture: NO GROWTH
Gram Stain: NONE SEEN

## 2017-12-04 ENCOUNTER — Telehealth: Payer: Self-pay | Admitting: *Deleted

## 2017-12-04 ENCOUNTER — Encounter: Payer: Self-pay | Admitting: *Deleted

## 2017-12-04 NOTE — Telephone Encounter (Signed)
-----   Message from Felecia Shelling, DPM sent at 12/04/2017  9:17 AM EDT ----- Regarding: Doctor's note for jury duty William Cameron, patient called last night concerned about jury duty coming up tomorrow.   Will you please write him a note stating that he cannot attend jury duty and he was inpatient in the hospital recently.   He is going to call this morning with a fax number to send it to or he will come by to pick up the note.   Thanks, Dr. Logan Bores

## 2017-12-04 NOTE — Telephone Encounter (Signed)
I spoke with pt, he states the jury duty area does not have a fax number and he was directed to report personally to their office before 2:30pm and 4:30pm. Pt states he is to be non-weight bearing and is on IV antibiotics and has had a recent hospitalization. Pt states he is getting ready for his wife to start the IV, which should last 2 hours, and he plans to have his wife go to their office with the letter, while he waits in the car. I told pt I would have the letter ready for him to deliver.

## 2017-12-05 ENCOUNTER — Ambulatory Visit (INDEPENDENT_AMBULATORY_CARE_PROVIDER_SITE_OTHER): Payer: Medicare HMO | Admitting: Podiatry

## 2017-12-05 ENCOUNTER — Ambulatory Visit (INDEPENDENT_AMBULATORY_CARE_PROVIDER_SITE_OTHER): Payer: Medicare HMO

## 2017-12-05 ENCOUNTER — Ambulatory Visit: Admit: 2017-12-05 | Payer: Medicare HMO | Admitting: Podiatry

## 2017-12-05 VITALS — BP 135/82 | HR 78 | Temp 97.7°F

## 2017-12-05 DIAGNOSIS — L02619 Cutaneous abscess of unspecified foot: Secondary | ICD-10-CM

## 2017-12-05 DIAGNOSIS — L03119 Cellulitis of unspecified part of limb: Secondary | ICD-10-CM | POA: Diagnosis not present

## 2017-12-05 DIAGNOSIS — L03116 Cellulitis of left lower limb: Secondary | ICD-10-CM

## 2017-12-05 DIAGNOSIS — M2042 Other hammer toe(s) (acquired), left foot: Secondary | ICD-10-CM

## 2017-12-05 SURGERY — REMOVAL, FINGERNAIL OR TOENAIL
Anesthesia: Monitor Anesthesia Care | Laterality: Left

## 2017-12-05 NOTE — Progress Notes (Signed)
   Subjective:  Patient presents today status post incision and drainage with removal of orthopedic screws and application of absorbable antibiotic beads left foot. DOS: 11/23/2017.  Surgery performed while patient was inpatient secondary to acute cellulitis of the left foot.  While inpatient a PICC line was placed and the patient is currently on IV long-term antibiotics being managed by infectious disease.  Patient states that his foot feels and looks significantly better since prior to surgery.  He is currently weightbearing heel touch in a postoperative shoe.  Past Medical History:  Diagnosis Date  . Bronchitis    chronic bronchitis  . COPD (chronic obstructive pulmonary disease) (HCC)   . Diabetes mellitus without complication (HCC)   . Elevated lactic acid level   . Hypertension   . PONV (postoperative nausea and vomiting)    post op nausea  . 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   . Sleep apnea    cpap at night      Objective/Physical Exam Neurovascular status intact.  Skin incisions appear to be well coapted with staples intact.  Significant improvement of the erythema to the surgical extremity.  There is a very focal area of the incision site that does have some drainage noted.  Drainage is serous with portions of the absorbable antibiotic beads.  This focal area of dehiscence is approximately 0.1 cm in diameter.  Negative for any significant pain on outpatient.  There continues to be chronic edema to the surgical foot.  Radiographic Exam:  Robotic beads noted within the area of the first metatarsal diaphysis.  Dorsal displacement of the distal fragment of the first metatarsal head.  Orthopedic screws have been removed.    Assessment: 1. s/p incision and drainage with removal of orthopedic screws and placement of antibiotic beads left foot. DOS:  11/23/2017   Plan of Care:  1. Patient was evaluated. X-rays reviewed 2.  Today dressings were changed.  Stainless steel skin staples were removed today.  Dry sterile dressing applied. 3.  Continue heel touch only to the surgical extremity in his postoperative shoe 4.  Continue IV antibiotics as per infectious disease 5.  Return to clinic in 2 weeks   Felecia Shelling, DPM [facility]  Dr. Felecia Shelling, DPM    [address]                                        [place], [place] [address]                Office [phone]  Fax [phone]

## 2017-12-06 ENCOUNTER — Encounter: Payer: Self-pay | Admitting: Infectious Diseases

## 2017-12-07 ENCOUNTER — Other Ambulatory Visit: Payer: Self-pay | Admitting: Pharmacist

## 2017-12-07 NOTE — Progress Notes (Signed)
OPAT pharmacy lab review  

## 2017-12-10 ENCOUNTER — Encounter: Payer: Medicare HMO | Admitting: Podiatry

## 2017-12-12 NOTE — Progress Notes (Signed)
DOS: 09-06-2017 Bunionectomy with osteotomy Left; Hammer toe Repair with capsulotomy and possible metatarsal osteotomy 2nd Left  [facility]

## 2017-12-17 ENCOUNTER — Encounter: Payer: Medicare HMO | Admitting: Podiatry

## 2017-12-19 ENCOUNTER — Ambulatory Visit (INDEPENDENT_AMBULATORY_CARE_PROVIDER_SITE_OTHER): Payer: Medicare HMO | Admitting: Podiatry

## 2017-12-19 DIAGNOSIS — E0843 Diabetes mellitus due to underlying condition with diabetic autonomic (poly)neuropathy: Secondary | ICD-10-CM

## 2017-12-19 DIAGNOSIS — Z9889 Other specified postprocedural states: Secondary | ICD-10-CM

## 2017-12-19 DIAGNOSIS — L03116 Cellulitis of left lower limb: Secondary | ICD-10-CM

## 2017-12-19 NOTE — Progress Notes (Signed)
   Subjective:  Patient presents today status post incision and drainage with removal of orthopedic screws and application of absorbable antibiotic beads left foot. DOS: 11/23/2017.  Surgery performed while patient was inpatient secondary to acute cellulitis of the left foot.  Patient is currently on IV antibiotics being managed by infectious disease.  He has been weightbearing heel touch in a postoperative shoe.  Patient says that the foot continues to swell and he does have some intermittent pain occasionally.  There is also an area of the incision site that continues to have drainage.  He presents for follow-up treatment evaluation  Past Medical History:  Diagnosis Date  . Bronchitis    chronic bronchitis  . COPD (chronic obstructive pulmonary disease) (HCC)   . Diabetes mellitus without complication (HCC)   . Elevated lactic acid level   . Hypertension   . PONV (postoperative nausea and vomiting)    post op nausea  . 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   . Sleep apnea    cpap at night   Objective/Physical Exam Neurovascular status intact. There is a very focal area of the incision site that does have some drainage noted.  Drainage is serous with portions of the absorbable antibiotic beads.  This focal area of dehiscence is approximately 0.3 cm in diameter.  Negative for any significant pain on palpation.  There continues to be chronic edema to the surgical foot.  Negative for any significant warmth or erythema  Assessment: 1. s/p incision and drainage with removal of orthopedic screws and placement of antibiotic beads left foot. DOS: 11/23/2017 2.  Focal area of dehiscence along the incision site left foot  Plan of Care:  1. Patient was evaluated. 2.  Light debridement was performed of the focal area of dehiscence measuring approximately 0.3 cm in diameter.  In  office decision was made to primarily close the small dehiscence site.  There is no evidence of acute infection to this area.  Wound margins appear healthy and viable.  The area was prepped in aseptic manner using Betadine scrub and primary closure was obtained using 4-0 nylon suture.  Dry sterile dressing applied 3.  Continue IV antibiotics as per infectious disease.  Patient has a follow-up appointment with infectious disease on 12/21/2017. 4.  Return to clinic in 1 week for follow-up evaluation of the primary closure site.  Felecia Shelling, DPM [facility]  Dr. Felecia Shelling, DPM    [address]                                        [place], [place] [address]                Office [phone]  Fax [phone]

## 2017-12-21 ENCOUNTER — Ambulatory Visit (INDEPENDENT_AMBULATORY_CARE_PROVIDER_SITE_OTHER): Payer: Medicare HMO | Admitting: Infectious Diseases

## 2017-12-21 ENCOUNTER — Inpatient Hospital Stay: Payer: Medicare HMO | Admitting: Infectious Diseases

## 2017-12-21 ENCOUNTER — Encounter: Payer: Self-pay | Admitting: Infectious Diseases

## 2017-12-21 VITALS — BP 121/84 | HR 59 | Temp 97.9°F | Wt 318.0 lb

## 2017-12-21 DIAGNOSIS — Z452 Encounter for adjustment and management of vascular access device: Secondary | ICD-10-CM | POA: Insufficient documentation

## 2017-12-21 DIAGNOSIS — M86272 Subacute osteomyelitis, left ankle and foot: Secondary | ICD-10-CM | POA: Diagnosis not present

## 2017-12-21 DIAGNOSIS — Z79899 Other long term (current) drug therapy: Secondary | ICD-10-CM | POA: Diagnosis not present

## 2017-12-21 NOTE — Progress Notes (Signed)
Patient: William Cameron  DOB: 04-10-1950 MRN: [id number] PCP: Jilda Panda, MD  Referring Provider: [facility] cellulitis  Podiatrist: Amalia Hailey, MD  Patient Active Problem List   Diagnosis Date Noted  . DM (diabetes mellitus), type 2, uncontrolled w/neurologic complication ([place]) 97/74/1423    Priority: Medium  . PICC (peripherally inserted central catheter) in place 12/21/2017  . Cellulitis 11/23/2017  . Unspecified atrial fibrillation ([place]) 11/23/2017  . Super obese 10/24/2017  . Medication management 10/23/2017  . Diabetic foot infection ([place]) 10/08/2017  . Osteomyelitis ([place]) 10/08/2017  . Stroke ([place])   . Insomnia with sleep apnea 01/24/2016  . Atrial fibrillation, new onset ([place]) 11/12/2015  . Essential hypertension 10/05/2014  . Morbid obesity ([place]) 07/15/2014  . 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 OSA on CPAP 10/21/2013  . Obesity with sleep apnea 10/21/2013  . Severe obesity (BMI >= 40) ([place]) 09/03/2013     Subjective:   Chief Complaint  Patient presents with  . Hospitalization Follow-up    Cellulitisand abescess of left foot   William Cameron is a 67 y.o. obese, diabetic male here for follow up on Left #1 MSSA osteomyelitis. He was sent to the hospital labor day weekend after he came to the office with severe swelling, discoloration and having recently evacuated an abscess at home following stopping 4 weeks of linezolid. He had incision and drainage with removal of orthopedic screws (hammer toe correction) on 11/23/17. Prior to surgery he was taking levaquin (left over antibiotic from previous episode of cellulitis) and IV vancomycin. Intraop cultures of bone were negative for growth but pathology confirmed acute osteomyelitis. There was not much purulence in the foot itself as the patient had evacuated abscess himself but there was significant swelling.   William Cameron has followed up with Dr. Amalia Hailey a few times since discharge from the hospital most recently on 9/25. He continues  to have a small area near the incision that continues to have drainage. Antibiotic beads (tobra/vanco) were placed. He has been in a weightbearing heel touch post-op shoe. Additional suture was placed at this site in the office to help with healing - tissue looked healthy and viable. He has had no pain in his foot but has continued to have som swelling. He would like for me to remove the dressing today to check on how it looks.   Review of Systems  Constitutional: Negative for chills, fever, malaise/fatigue and weight loss.  HENT: Negative for sore throat.   Respiratory: Negative for cough and sputum production.   Cardiovascular: Positive for leg swelling (Left lower foot ). Negative for chest pain.  Gastrointestinal: Negative for abdominal pain, diarrhea and vomiting.  Genitourinary: Negative for dysuria and flank pain.  Musculoskeletal: Negative for joint pain, myalgias and neck pain.  Skin: Negative for rash.  Neurological: Negative for dizziness, tingling and headaches.  Psychiatric/Behavioral: Negative for depression and substance abuse. The patient is not nervous/anxious and does not have insomnia.     Past Medical History:  Diagnosis Date  . Bronchitis    chronic bronchitis  . COPD (chronic obstructive pulmonary disease) ([place])   . Diabetes mellitus without complication ([place])   . Elevated lactic acid level   . Hypertension   . PONV (postoperative nausea and vomiting)    post op nausea  . 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   Marland Kitchen  Sleep apnea    cpap at night    Outpatient Medications Prior to Visit  Medication Sig Dispense Refill  . amLODipine (NORVASC) 5 MG tablet Take 5 mg by mouth daily.    Marland Kitchen apixaban (ELIQUIS) 5 MG TABS tablet Take 5 mg by mouth 2 (two) times daily.    . benazepril (LOTENSIN) 40 MG tablet Take 40 mg by mouth daily after supper.     .  Cholecalciferol (VITAMIN D) 2000 units tablet Take 4,000 Units by mouth daily.     . dapagliflozin propanediol (FARXIGA) 5 MG TABS tablet Take 5 mg by mouth 2 (two) times daily.    . fenofibrate micronized (ANTARA) 130 MG capsule Take 130 mg by mouth daily after supper.   3  . ibuprofen (ADVIL,MOTRIN) 200 MG tablet Take 400 mg by mouth at bedtime.     . metoprolol succinate (TOPROL-XL) 50 MG 24 hr tablet Take 50 mg by mouth daily after supper. Take with or immediately following a meal.     . PRESCRIPTION MEDICATION Inhale into the lungs at bedtime. CPAP    . traMADol (ULTRAM) 50 MG tablet Take one tablet every 8 hours prn foot pain. (Patient taking differently: Take 50 mg by mouth every 8 (eight) hours as needed (foot pain). ) 30 tablet 0  . vancomycin IVPB Inject 1,750 mg into the vein daily. Indication:  Hardware associated osteomyelitis of left foot  Last Day of Therapy:  01/07/18 Labs - Sunday/Monday:  CBC/D, BMP, and vancomycin trough. Labs - Thursday:  BMP and vancomycin trough Labs - Every other week:  ESR and CRP 41 Units 0   No facility-administered medications prior to visit.      Allergies  Allergen Reactions  . Penicillins Anaphylaxis    Has patient had a PCN reaction causing immediate rash, facial/tongue/throat swelling, SOB or lightheadedness with hypotension: Yes Has patient had a PCN reaction causing severe rash involving mucus membranes or skin necrosis: Yes Has patient had a PCN reaction that required hospitalization Yes Has patient had a PCN reaction occurring within the last 10 years: No If all of the above answers are "NO", then may proceed with Cephalosporin use.  . Tetanus Toxoids Anaphylaxis, Shortness Of Breath and Swelling  . Sulfa Antibiotics Nausea And Vomiting and Swelling    Social History   Tobacco Use  . Smoking status: Never Smoker  . Smokeless tobacco: Never Used  Substance Use Topics  . Alcohol use: No  . Drug use: No    Family History    Problem Relation Age of Onset  . Diabetes type II Mother   . Prostate cancer Father 87    Objective:   Vitals:   12/21/17 1035  BP: 121/84  Pulse: (!) 59  Temp: 97.9 F (36.6 C)  Weight: (!) 318 lb (144.2 kg)   Body mass index is 43.13 kg/m.  Physical Exam  Constitutional: He is oriented to person, place, and time. He appears well-developed.  Obese, pleasant man seated in chair.   Eyes: Pupils are equal, round, and reactive to light. No scleral icterus.  Cardiovascular: Normal rate and regular rhythm.  Pulmonary/Chest: Effort normal. No respiratory distress.  Abdominal: Soft.  Musculoskeletal: He exhibits edema.  Left foot incision well healed except a small area that was recently sutured. This area is clean and free from any drainage. There is residual swelling in his foot and discoloration that clears once foot is elevated.   Lymphadenopathy:    He has no cervical adenopathy.  Neurological: He is alert and oriented to person, place, and time.  Skin: Skin is warm and dry. Capillary refill takes less than 2 seconds.  PICC line RUE - single lumen. Some dried blood on biopatch but dressing is otherwise clean/dry. Line with brisk blood return.   Lab Results: Lab Results  Component Value Date   WBC 10.1 11/27/2017   HGB 13.5 11/27/2017   HCT 43.6 11/27/2017   MCV 92.8 11/27/2017   PLT 169 11/27/2017    Lab Results  Component Value Date   CREATININE 1.44 (H) 11/27/2017   BUN 19 11/27/2017   NA 138 11/27/2017   K 4.1 11/27/2017   CL 104 11/27/2017   CO2 26 11/27/2017    Lab Results  Component Value Date   ALT 12 11/27/2017   AST 18 11/27/2017   ALKPHOS 56 11/27/2017   BILITOT 0.9 11/27/2017     Assessment & Plan:   Problem List Items Addressed This Visit      Unprioritized   Medication management    All OPAT lab work reviewed and stable with creatinine 1.28 - 1.33. Normalization of wbc count as well as inflammatory markers noted. Recent troughs 13.5 -  15.6. Currently his projected end date 01/07/18 to complete 6 weeks.       Osteomyelitis ([place]) - Primary    He is on day 26 of antibiotic therapy following his surgery with vancomycin through a picc line. Bone biopsy pathology confirming acute osteo. Discussed findings with him today and all labs. His inflammatory markers have normalized on treatment which is also very encouraging. I am hopeful that his swelling will continue to improve - there is no swelling in his ankle or shin only in the forefoot. His incision was redressed today with non-adherent pad, gauze and coband.       PICC (peripherally inserted central catheter) in place    I disconnected his antibiotic and flushed PICC line for him with saline followed by heparin today to lock line. He recently had it TPA'd - good brisk return for me noted. Continue care and maintenance with biopatch. OK to pull line once antibiotics are completed.         Return in about 3 weeks (around 01/11/2018).  Janene Madeira, MSN, NP-C [facility] for [facility] [place] Pager: [phone] Office: [phone]  12/21/17  12:59 PM

## 2017-12-21 NOTE — Patient Instructions (Addendum)
Nice to see you again!   Your foot looks much better. Based on how your foot looks and your blood work I think we are on the right track!   Continue your antibiotics until October 14 and then your home health team can pull your picc line out.   Will see you back in 3 weeks. Please call me with any changes with your foot or any concern you have.

## 2017-12-21 NOTE — Assessment & Plan Note (Signed)
He is on day 26 of antibiotic therapy following his surgery with vancomycin through a picc line. Bone biopsy pathology confirming acute osteo. Discussed findings with him today and all labs. His inflammatory markers have normalized on treatment which is also very encouraging. I am hopeful that his swelling will continue to improve - there is no swelling in his ankle or shin only in the forefoot. His incision was redressed today with non-adherent pad, gauze and coband.

## 2017-12-21 NOTE — Assessment & Plan Note (Signed)
All OPAT lab work reviewed and stable with creatinine 1.28 - 1.33. Normalization of wbc count as well as inflammatory markers noted. Recent troughs 13.5 - 15.6. Currently his projected end date 01/07/18 to complete 6 weeks.

## 2017-12-21 NOTE — Assessment & Plan Note (Addendum)
I disconnected his antibiotic and flushed PICC line for him with saline followed by heparin today to lock line. He recently had it TPA'd - good brisk return for me noted. Continue care and maintenance with biopatch. OK to pull line once antibiotics are completed.

## 2017-12-24 ENCOUNTER — Encounter: Payer: Self-pay | Admitting: Infectious Diseases

## 2017-12-26 ENCOUNTER — Ambulatory Visit (INDEPENDENT_AMBULATORY_CARE_PROVIDER_SITE_OTHER): Payer: Medicare HMO | Admitting: Podiatry

## 2017-12-26 DIAGNOSIS — Z9889 Other specified postprocedural states: Secondary | ICD-10-CM

## 2017-12-26 DIAGNOSIS — E0843 Diabetes mellitus due to underlying condition with diabetic autonomic (poly)neuropathy: Secondary | ICD-10-CM

## 2017-12-26 DIAGNOSIS — L03116 Cellulitis of left lower limb: Secondary | ICD-10-CM

## 2017-12-26 NOTE — Progress Notes (Signed)
   Subjective:  Patient presents today status post incision and drainage with removal of orthopedic screws and application of absorbable antibiotic beads left foot. DOS: 11/23/2017.  Last visit on 12/19/2017 the small area of dehiscence along the dorsal medial incision site was excisionally debrided and primarily closed using 3-0 nylon suture.  Patient also saw infectious disease on 12/21/2017 who seemed optimistic that the infection is resolved.  Patient complains that the foot continues to be swollen at the moment.  Past Medical History:  Diagnosis Date  . Bronchitis    chronic bronchitis  . COPD (chronic obstructive pulmonary disease) (HCC)   . Diabetes mellitus without complication (HCC)   . Elevated lactic acid level   . Hypertension   . PONV (postoperative nausea and vomiting)    post op nausea  . 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   . Sleep apnea    cpap at night   Objective/Physical Exam Neurovascular status intact.  Chronic edema to the surgical forefoot.  Sutures are intact's from previous visit.  Minimal drainage noted.  No purulence noted.  No malodor noted.  Assessment: 1. s/p incision and drainage with removal of orthopedic screws and placement of antibiotic beads left foot. DOS: 11/23/2017  Plan of Care:  1. Patient was evaluated. 2.  Recommend anabolic ointment a Band-Aid to the suture site.  Sutures are left intact today. 3.  Today we are going to resume compression ankle sleeves to help alleviate chronic edema.  Compression anklets dispensed today 4.  Continue weightbearing in a postoperative shoe 5.  Return to clinic in 1 week for suture removal  Felecia Shelling, DPM [facility]  Dr. Felecia Shelling, DPM    [address]                                        [place], [place] [address]                Office [phone]  Fax  [phone]     Your own the lobe of the

## 2017-12-31 NOTE — Telephone Encounter (Signed)
Error   William Cameron, CMA   

## 2018-01-02 ENCOUNTER — Ambulatory Visit (INDEPENDENT_AMBULATORY_CARE_PROVIDER_SITE_OTHER): Payer: Medicare HMO | Admitting: Podiatry

## 2018-01-02 ENCOUNTER — Ambulatory Visit (INDEPENDENT_AMBULATORY_CARE_PROVIDER_SITE_OTHER): Payer: Medicare HMO

## 2018-01-02 DIAGNOSIS — Z9889 Other specified postprocedural states: Secondary | ICD-10-CM

## 2018-01-02 DIAGNOSIS — L03116 Cellulitis of left lower limb: Secondary | ICD-10-CM | POA: Diagnosis not present

## 2018-01-02 DIAGNOSIS — R600 Localized edema: Secondary | ICD-10-CM

## 2018-01-02 DIAGNOSIS — E0843 Diabetes mellitus due to underlying condition with diabetic autonomic (poly)neuropathy: Secondary | ICD-10-CM

## 2018-01-11 ENCOUNTER — Encounter: Payer: Self-pay | Admitting: Infectious Diseases

## 2018-01-11 ENCOUNTER — Ambulatory Visit (INDEPENDENT_AMBULATORY_CARE_PROVIDER_SITE_OTHER): Payer: Medicare HMO | Admitting: Infectious Diseases

## 2018-01-11 VITALS — BP 126/76 | HR 62 | Temp 98.3°F | Ht 72.0 in | Wt 353.0 lb

## 2018-01-11 DIAGNOSIS — Z79899 Other long term (current) drug therapy: Secondary | ICD-10-CM | POA: Diagnosis not present

## 2018-01-11 DIAGNOSIS — M86272 Subacute osteomyelitis, left ankle and foot: Secondary | ICD-10-CM | POA: Diagnosis not present

## 2018-01-11 DIAGNOSIS — Z452 Encounter for adjustment and management of vascular access device: Secondary | ICD-10-CM | POA: Diagnosis not present

## 2018-01-11 MED ORDER — LINEZOLID 600 MG PO TABS: 600.0000 mg | ORAL_TABLET | Freq: Two times a day (BID) | ORAL | 0 refills | Status: DC

## 2018-01-11 NOTE — Assessment & Plan Note (Signed)
His foot appears to be healing nicely but still having some residual swelling that is more profound than normal for him. Pain has resolved. Will finish out treatment with 14d of PO linezolid and then stop. He has been up on his feet more recently than ever in the past 3 months, which is reassuring to me that his swelling is still much better than before. I think the swelling is more likely related to his vasculature and drinking a lot of water vs ongoing infection but will err on the side of being more cautious.   I asked him to decrease his water intake and continue compression therapy as Dr. Logan Bores recommended.   He can follow up again in 6 weeks with me.

## 2018-01-11 NOTE — Assessment & Plan Note (Signed)
Removed. Site looks good.

## 2018-01-11 NOTE — Assessment & Plan Note (Signed)
All OPAT lab work reviewed and within normal limits. Creatinine stable and ongoing normalization of wbc/inflammatory markers reassuring.

## 2018-01-11 NOTE — Progress Notes (Signed)
Patient: William Cameron  DOB: [birth year] MRN: [id number] PCP: Ralene Ok, MD  Referring Provider: [facility] cellulitis  Podiatrist: Logan Bores, MD  Patient Active Problem List   Diagnosis Date Noted  . DM (diabetes mellitus), type 2, uncontrolled w/neurologic complication (HCC) 10/05/2014    Priority: Medium  . Unspecified atrial fibrillation (HCC) 11/23/2017  . Super obese 10/24/2017  . Medication management 10/23/2017  . Osteomyelitis (HCC) 10/08/2017  . Stroke (HCC)   . Insomnia with sleep apnea 01/24/2016  . Atrial fibrillation, new onset (HCC) 11/12/2015  . Essential hypertension 10/05/2014  . Morbid obesity (HCC) 07/15/2014  . 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 OSA on CPAP 10/21/2013  . Obesity with sleep apnea 10/21/2013  . Severe obesity (BMI >= 40) (HCC) 09/03/2013     Subjective:   Chief Complaint  Patient presents with  . Follow-up    suacute osteomyelitis of left foot   William Cameron is a 67 y.o. obese, diabetic male here for follow up on L foot MSSA osteomyelitis. He was sent to the hospital labor day weekend after he came to the office with severe swelling, discoloration and having recently evacuated an abscess at home following stopping 4 weeks of linezolid. He had incision and drainage with removal of orthopedic screws (hammer toe correction) on 11/23/17. Prior to surgery he was taking levaquin (left over antibiotic from previous episode of cellulitis) and IV vancomycin. Intraop cultures of bone were negative for growth; pathology confirmed acute osteomyelitis. There was not much purulence in the foot per op note as the patient had evacuated abscess himself; there was significant swelling. He was treated with 6 weeks of IV Vancomycin following this procedure.   PICC line has been out since Tuesday and he feels so grateful that is done. He has been following up closely with Dr. Logan Bores for his foot. Previous dehisced suture line has now healed over. He is now wearing  compression stockings and has for the first time since the summer been able to fit into a regular shoe. He still has some swelling of this foot but he tells me it is "nearly back to normal" for him. He was drinking a lot of water (100oz apparently while he was on the IV vancomycin to help keep his "kidneys flushed."   Review of Systems  Constitutional: Negative for chills, fever, malaise/fatigue and weight loss.  HENT: Negative for sore throat.   Respiratory: Negative for cough and sputum production.   Cardiovascular: Positive for leg swelling (Left lower foot ). Negative for chest pain.  Gastrointestinal: Negative for abdominal pain, diarrhea and vomiting.  Genitourinary: Negative for dysuria and flank pain.  Musculoskeletal: Negative for joint pain, myalgias and neck pain.  Skin: Negative for rash.  Neurological: Negative for dizziness, tingling and headaches.  Psychiatric/Behavioral: Negative for depression and substance abuse. The patient is not nervous/anxious and does not have insomnia.     Past Medical History:  Diagnosis Date  . Bronchitis    chronic bronchitis  . COPD (chronic obstructive pulmonary disease) (HCC)   . Diabetes mellitus without complication (HCC)   . Elevated lactic acid level   . Hypertension   . PONV (postoperative nausea and vomiting)    post op nausea  . 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   . Sleep apnea    cpap at night  Outpatient Medications Prior to Visit  Medication Sig Dispense Refill  . amLODipine (NORVASC) 5 MG tablet Take 5 mg by mouth daily.    Marland Kitchen apixaban (ELIQUIS) 5 MG TABS tablet Take 5 mg by mouth 2 (two) times daily.    . benazepril (LOTENSIN) 40 MG tablet Take 40 mg by mouth daily after supper.     Marland Kitchen CATHFLO ACTIVASE 2 MG injection     . Cholecalciferol (VITAMIN D) 2000 units tablet Take 4,000 Units by mouth  daily.     . dapagliflozin propanediol (FARXIGA) 5 MG TABS tablet Take 5 mg by mouth 2 (two) times daily.    . fenofibrate micronized (ANTARA) 130 MG capsule Take 130 mg by mouth daily after supper.   3  . fluconazole (DIFLUCAN) 100 MG tablet Take 100 mg by mouth daily.  0  . ibuprofen (ADVIL,MOTRIN) 200 MG tablet Take 400 mg by mouth at bedtime.     . metoprolol succinate (TOPROL-XL) 50 MG 24 hr tablet Take 50 mg by mouth daily after supper. Take with or immediately following a meal.     . nystatin cream (MYCOSTATIN) APPLY CREAM TOPICALLY TO AFFECTED AREA TWICE DAILY  1  . PRESCRIPTION MEDICATION Inhale into the lungs at bedtime. CPAP    . traMADol (ULTRAM) 50 MG tablet Take one tablet every 8 hours prn foot pain. (Patient taking differently: Take 50 mg by mouth every 8 (eight) hours as needed (foot pain). ) 30 tablet 0  . vancomycin (VANCOCIN) 10 G SOLR injection      No facility-administered medications prior to visit.      Allergies  Allergen Reactions  . Penicillins Anaphylaxis    Has patient had a PCN reaction causing immediate rash, facial/tongue/throat swelling, SOB or lightheadedness with hypotension: Yes Has patient had a PCN reaction causing severe rash involving mucus membranes or skin necrosis: Yes Has patient had a PCN reaction that required hospitalization Yes Has patient had a PCN reaction occurring within the last 10 years: No If all of the above answers are "NO", then may proceed with Cephalosporin use.  . Tetanus Toxoids Anaphylaxis, Shortness Of Breath and Swelling  . Sulfa Antibiotics Nausea And Vomiting and Swelling    Social History   Tobacco Use  . Smoking status: Never Smoker  . Smokeless tobacco: Never Used  Substance Use Topics  . Alcohol use: No  . Drug use: No    Family History  Problem Relation Age of Onset  . Diabetes type II Mother   . Prostate cancer Father 70    Objective:   Vitals:   01/11/18 1108  BP: 126/76  Pulse: 62  Temp: 98.3 F  (36.8 C)  Weight: (!) 353 lb (160.1 kg)  Height: 6' (1.829 m)   Body mass index is 47.88 kg/m.  Physical Exam  Constitutional: He is oriented to person, place, and time. He appears well-developed.  Obese, pleasant man seated in chair.   Cardiovascular: Normal rate.  Pulmonary/Chest: Effort normal. No respiratory distress.  Musculoskeletal: He exhibits edema.  L foot incision completely healed over. Suture has been removed. Still some purple discoloration and diffuse swelling with dependent positioning. No warmth or drainage or concern for abscess.   Neurological: He is alert and oriented to person, place, and time.  Skin: Skin is warm and dry. Capillary refill takes less than 2 seconds.    Lab Results: Lab Results  Component Value Date   WBC 10.1 11/27/2017   HGB 13.5 11/27/2017   HCT  43.6 11/27/2017   MCV 92.8 11/27/2017   PLT 169 11/27/2017    Lab Results  Component Value Date   CREATININE 1.44 (H) 11/27/2017   BUN 19 11/27/2017   NA 138 11/27/2017   K 4.1 11/27/2017   CL 104 11/27/2017   CO2 26 11/27/2017    Lab Results  Component Value Date   ALT 12 11/27/2017   AST 18 11/27/2017   ALKPHOS 56 11/27/2017   BILITOT 0.9 11/27/2017     Assessment & Plan:   Problem List Items Addressed This Visit      Unprioritized   Medication management    All OPAT lab work reviewed and within normal limits. Creatinine stable and ongoing normalization of wbc/inflammatory markers reassuring.        Osteomyelitis (HCC)    His foot appears to be healing nicely but still having some residual swelling that is more profound than normal for him. Pain has resolved. Will finish out treatment with 14d of PO linezolid and then stop. He has been up on his feet more recently than ever in the past 3 months, which is reassuring to me that his swelling is still much better than before. I think the swelling is more likely related to his vasculature and drinking a lot of water vs ongoing  infection but will err on the side of being more cautious.   I asked him to decrease his water intake and continue compression therapy as Dr. Logan Bores recommended.   He can follow up again in 6 weeks with me.       Relevant Medications   linezolid (ZYVOX) 600 MG tablet   RESOLVED: PICC (peripherally inserted central catheter) in place    Removed. Site looks good.         Return in about 6 weeks (around 02/22/2018).  Rexene Alberts, MSN, NP-C [facility] Pager: [phone] Office: [phone]  01/11/18  1:34 PM

## 2018-01-11 NOTE — Patient Instructions (Signed)
Wonderful to see your foot looking so much better.   I want to give you just 14 more days of antibiotic by mouth to finish up treatment for you - this will be the same linezolid we used for you before. Please stop all atntibiotics after this is done.   Would like to see you back in 6 weeks - if you have any worsening please let me and Dr. Logan Bores know.

## 2018-01-13 NOTE — Progress Notes (Signed)
   Subjective:  Patient presents today status post incision and drainage with removal of orthopedic screws and application of absorbable antibiotic beads left foot. DOS: 11/23/2017.  Visit on 12/19/2017 the small area of dehiscence along the dorsal medial incision site was excisionally debrided and primarily closed using 3-0 nylon suture.  Patient also saw infectious disease on 12/21/2017 who seemed optimistic that the infection is resolved.  Patient complains that the foot continues to be swollen at the moment.  No significant changes since last visit.  Patient denies pain.  He is been weightbearing in a postoperative shoe.  Past Medical History:  Diagnosis Date  . Bronchitis    chronic bronchitis  . COPD (chronic obstructive pulmonary disease) (HCC)   . Diabetes mellitus without complication (HCC)   . Elevated lactic acid level   . Hypertension   . PONV (postoperative nausea and vomiting)    post op nausea  . 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   . Sleep apnea    cpap at night   Objective/Physical Exam Neurovascular status intact.  Chronic edema to the surgical forefoot.  Sutures are intact's from prior visit.  Minimal drainage noted.  No purulence noted.  No malodor noted.  Assessment: 1. s/p incision and drainage with removal of orthopedic screws and placement of antibiotic beads left foot. DOS: 11/23/2017  Plan of Care:  1. Patient was evaluated. 2.  Sutures were removed today.  Recommend daily application of antibiotic ointment and a Band-Aid 3.  Resume compression anklet. 4.  Patient sees infectious disease on 01/11/2018. 5.  Return to clinic in 2 weeks for possible application of Unna boot to help reduce the amount of edema to the surgical extremity  Felecia Shelling, DPM [facility]  Dr. Felecia Shelling, DPM    [address]                                         [place], [place] [address]                Office [phone]  Fax [phone]     Your own the lobe of the

## 2018-01-16 ENCOUNTER — Encounter: Payer: Self-pay | Admitting: Podiatry

## 2018-01-16 ENCOUNTER — Ambulatory Visit: Payer: Medicare HMO

## 2018-01-16 ENCOUNTER — Ambulatory Visit (INDEPENDENT_AMBULATORY_CARE_PROVIDER_SITE_OTHER): Payer: Medicare HMO | Admitting: Podiatry

## 2018-01-16 ENCOUNTER — Telehealth: Payer: Self-pay | Admitting: Pharmacist

## 2018-01-16 ENCOUNTER — Telehealth: Payer: Self-pay | Admitting: Behavioral Health

## 2018-01-16 DIAGNOSIS — L03116 Cellulitis of left lower limb: Secondary | ICD-10-CM

## 2018-01-16 DIAGNOSIS — M2042 Other hammer toe(s) (acquired), left foot: Secondary | ICD-10-CM

## 2018-01-16 DIAGNOSIS — R600 Localized edema: Secondary | ICD-10-CM

## 2018-01-16 NOTE — Progress Notes (Signed)
   Subjective:  Patient presents today status post incision and drainage with removal of orthopedic screws and application of absorbable antibiotic beads left foot. DOS: 11/23/2017.  Patient was seen on 01/02/2018 and he says that the swelling has decreased significantly.  Patient denies any pain at the moment.  He has been wearing good supportive tennis shoes and a compression anklet daily.  He last saw infectious disease on 01/11/2018 and he is scheduled to take a final additional round of antibiotics.  Per the patient's report he says that infectious disease believes the infection is gone.  He presents for follow-up treatment evaluation  Past Medical History:  Diagnosis Date  . Bronchitis    chronic bronchitis  . COPD (chronic obstructive pulmonary disease) (HCC)   . Diabetes mellitus without complication (HCC)   . Elevated lactic acid level   . Hypertension   . PONV (postoperative nausea and vomiting)    post op nausea  . 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   . Sleep apnea    cpap at night   Objective/Physical Exam Neurovascular status intact.  Edema to the surgical extremity is significantly improved.  No erythema noted.  Negative for any pain on palpation throughout the foot.  Muscle strength 5/5 all muscle compartments.  Assessment: 1. s/p incision and drainage with removal of orthopedic screws and placement of antibiotic beads left foot. DOS: 11/23/2017  Plan of Care:  1. Patient was evaluated. 2.  Continue wearing compression anklet.  Continue wearing good supportive shoe gear. 3.  We will schedule one final follow-up visit in 8 weeks.  Return to clinic sooner if any problems arise.  Otherwise patient can slowly increase daily activities  Felecia Shelling, DPM [facility]  Dr. Felecia Shelling, DPM    [address]                                         [place], [place] [address]                Office [phone]  Fax [phone]     Your own the lobe of the

## 2018-01-16 NOTE — Telephone Encounter (Signed)
William Cameron prescribed Zyvox x 14 days for patient but patient has ~$250 co-pay.  [employer] and an application with proof of income will need to be sent in. Called patient and he will bring in documentation tomorrow.

## 2018-01-16 NOTE — Telephone Encounter (Signed)
Prior Authorization initiated for Linezolid 600 mg tablets through Cover my meds. [place] Case ID: [id number] Awaiting approval or denial. Angeline Slim RN

## 2018-01-16 NOTE — Telephone Encounter (Signed)
Prior Authorization approved today 01/16/2018.  Called [facility] [address] to give this information.  Spoke to pharmacy and patient's copay will still be $236.48.  Working with [facility] pharmacy to see if there are programs available.    Called patient and gave him the options and he does not want to pay 90.00 which was the lowest option with [facility]. Angeline Slim RN

## 2018-01-17 ENCOUNTER — Inpatient Hospital Stay: Payer: Medicare HMO | Admitting: Infectious Diseases

## 2018-01-17 ENCOUNTER — Ambulatory Visit (INDEPENDENT_AMBULATORY_CARE_PROVIDER_SITE_OTHER): Payer: Medicare HMO | Admitting: Pharmacist

## 2018-01-17 DIAGNOSIS — M86272 Subacute osteomyelitis, left ankle and foot: Secondary | ICD-10-CM | POA: Diagnosis not present

## 2018-01-17 DIAGNOSIS — Z79899 Other long term (current) drug therapy: Secondary | ICD-10-CM

## 2018-01-17 MED ORDER — LINEZOLID 600 MG PO TABS: 600.0000 mg | ORAL_TABLET | Freq: Two times a day (BID) | ORAL | 0 refills | Status: AC

## 2018-01-17 NOTE — Progress Notes (Signed)
  Patient came in to give me income information so I can apply to [employer] for patient assistance with Linezolid. Will keep William Cameron and patient updated when I receive news if he is approved or not.

## 2018-01-21 NOTE — Telephone Encounter (Signed)
William Cameron has met with William Cameron and applied for patient assistance to get 14 days of tx. No further action, thank you.

## 2018-01-22 ENCOUNTER — Telehealth: Payer: Self-pay | Admitting: Pharmacist

## 2018-01-22 NOTE — Telephone Encounter (Signed)
Patient has been approved to receive Zyvox through patient assistance with [employer].  They will mail it out to him today and he will receive tomorrow.

## 2018-01-22 NOTE — Telephone Encounter (Signed)
Awesome news - thank you! I am sure William Cameron appreciates your help.

## 2018-01-22 NOTE — Telephone Encounter (Signed)
An[employer]

## 2018-03-04 ENCOUNTER — Ambulatory Visit: Payer: Medicare HMO | Admitting: Infectious Diseases

## 2018-03-04 ENCOUNTER — Encounter: Payer: Self-pay | Admitting: Infectious Diseases

## 2018-03-04 VITALS — BP 133/80 | HR 54 | Temp 98.0°F | Ht 72.0 in

## 2018-03-04 DIAGNOSIS — M86272 Subacute osteomyelitis, left ankle and foot: Secondary | ICD-10-CM

## 2018-03-04 DIAGNOSIS — J069 Acute upper respiratory infection, unspecified: Secondary | ICD-10-CM

## 2018-03-04 NOTE — Progress Notes (Signed)
Patient: William Cameron  DOB: 1950-08-18 MRN: [id number] PCP: Ralene OkMoreira, Roy, MD  PodiatristLogan Bores: Evans, MD  Patient Active Problem List   Diagnosis Date Noted  . DM (diabetes mellitus), type 2, uncontrolled w/neurologic complication (HCC) 10/05/2014    Priority: Medium  . Viral URI 03/04/2018  . Unspecified atrial fibrillation (HCC) 11/23/2017  . Super obese 10/24/2017  . Osteomyelitis (HCC) 10/08/2017  . Stroke (HCC)   . Insomnia with sleep apnea 01/24/2016  . Atrial fibrillation, new onset (HCC) 11/12/2015  . Essential hypertension 10/05/2014  . Morbid obesity (HCC) 07/15/2014  . 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 OSA on CPAP 10/21/2013  . Obesity with sleep apnea 10/21/2013  . Severe obesity (BMI >= 40) (HCC) 09/03/2013     Subjective:   Chief Complaint  Patient presents with  . Follow-up    osteomyeltis of left foot   Brief ID Narrative: William MylarMichael E Lines is a 67 y.o. obese, diabetic male with a h/o L foot MSSA osteomyelitis/cellulitis. After failure of linezolid therapy x 4 weeks likely d/t infected hardware, he had incision and drainage with removal of orthopedic screws (hammer toe correction) on 11/23/17. Intraop cultures of bone were negative for growth however he did take Levaquin and received IV vancomycin prior to surgery; pathology confirmed acute osteomyelitis. There was not much purulence in the foot per op note as the patient had evacuated abscess himself; there was significant swelling. He was treated with 6 weeks of IV Vancomycin following this procedure and another 2 weeks of PO linezolid for 8 weeks of treatment total.   HPI/ROS: He and his wife are here today for follow up after completion of antibiotic therapy. He has resumed normal activities, socks and foot ware and has not had any further trouble with his foot. There is no further significant discoloration even in a dependent position for prolonged time frame. All incision lines remain well healed.   He is  complaining of congestion/sinus pressure x 3d. No fevers or headaches but does have associated malaise, fatigue and postnasal drip. Has had some nose bleeds but uses thin layer of vaseline to nares and this resolves it easily.   Review of Systems  Constitutional: Negative for chills, fever, malaise/fatigue and weight loss.  HENT: Positive for congestion and sore throat.   Respiratory: Negative for cough, sputum production and shortness of breath.        Sneezing  Cardiovascular: Negative for chest pain and leg swelling.  Gastrointestinal: Negative for abdominal pain, diarrhea and vomiting.  Genitourinary: Negative for dysuria and flank pain.  Musculoskeletal: Negative for joint pain, myalgias and neck pain.  Skin: Negative for rash.  Neurological: Negative for dizziness, tingling and headaches.  Psychiatric/Behavioral: Negative for depression and substance abuse. The patient is not nervous/anxious and does not have insomnia.     Past Medical History:  Diagnosis Date  . Bronchitis    chronic bronchitis  . COPD (chronic obstructive pulmonary disease) (HCC)   . Diabetes mellitus without complication (HCC)   . Elevated lactic acid level   . Hypertension   . PONV (postoperative nausea and vomiting)    post op nausea  . 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   . Sleep apnea    cpap at night    Outpatient Medications Prior to Visit  Medication Sig Dispense Refill  . amLODipine (NORVASC) 5 MG  tablet Take 5 mg by mouth daily.    Marland Kitchen apixaban (ELIQUIS) 5 MG TABS tablet Take 5 mg by mouth 2 (two) times daily.    . benazepril (LOTENSIN) 40 MG tablet Take 40 mg by mouth daily after supper.     Marland Kitchen CATHFLO ACTIVASE 2 MG injection     . Cholecalciferol (VITAMIN D) 2000 units tablet Take 4,000 Units by mouth daily.     . dapagliflozin propanediol (FARXIGA) 5 MG TABS tablet Take 5  mg by mouth 2 (two) times daily.    . fenofibrate micronized (ANTARA) 130 MG capsule Take 130 mg by mouth daily after supper.   3  . fluconazole (DIFLUCAN) 100 MG tablet Take 100 mg by mouth daily.  0  . ibuprofen (ADVIL,MOTRIN) 200 MG tablet Take 400 mg by mouth at bedtime.     . metoprolol succinate (TOPROL-XL) 50 MG 24 hr tablet Take 50 mg by mouth daily after supper. Take with or immediately following a meal.     . nystatin cream (MYCOSTATIN) APPLY CREAM TOPICALLY TO AFFECTED AREA TWICE DAILY  1  . PRESCRIPTION MEDICATION Inhale into the lungs at bedtime. CPAP    . traMADol (ULTRAM) 50 MG tablet Take one tablet every 8 hours prn foot pain. (Patient taking differently: Take 50 mg by mouth every 8 (eight) hours as needed (foot pain). ) 30 tablet 0  . vancomycin (VANCOCIN) 10 G SOLR injection      No facility-administered medications prior to visit.      Allergies  Allergen Reactions  . Penicillins Anaphylaxis    Has patient had a PCN reaction causing immediate rash, facial/tongue/throat swelling, SOB or lightheadedness with hypotension: Yes Has patient had a PCN reaction causing severe rash involving mucus membranes or skin necrosis: Yes Has patient had a PCN reaction that required hospitalization Yes Has patient had a PCN reaction occurring within the last 10 years: No If all of the above answers are "NO", then may proceed with Cephalosporin use.  . Tetanus Toxoids Anaphylaxis, Shortness Of Breath and Swelling  . Sulfa Antibiotics Nausea And Vomiting and Swelling    Social History   Tobacco Use  . Smoking status: Never Smoker  . Smokeless tobacco: Never Used  Substance Use Topics  . Alcohol use: No  . Drug use: No    Family History  Problem Relation Age of Onset  . Diabetes type II Mother   . Prostate cancer Father 1    Objective:   Vitals:   03/04/18 1607  BP: 133/80  Pulse: (!) 54  Temp: 98 F (36.7 C)  Height: 6' (1.829 m)   Body mass index is 47.88  kg/m.  Physical Exam  Constitutional: He is oriented to person, place, and time. He appears well-developed.  Obese, pleasant man seated in chair. Fatigued today with URI symptoms.   HENT:  Mouth/Throat: No oropharyngeal exudate.  Eyes: Pupils are equal, round, and reactive to light.  Cardiovascular: Normal rate.  Pulmonary/Chest: Effort normal. No respiratory distress.  Musculoskeletal: He exhibits no edema.  L foot incision completely healed over.No residual swelling. Easily palpable pulses.   Neurological: He is alert and oriented to person, place, and time.  Skin: Skin is warm and dry. Capillary refill takes less than 2 seconds.    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 Since stopping antibiotics he has had ongoing improvement and resolution of symptoms. I told him and his wife today that he seems to be cured of his infection. No need to check labwork today with excellent clinical response. Can follow up as needed with our team. He has podiatry appointment soon.       Viral URI     Return if symptoms worsen or fail to improve.  Rexene Alberts, MSN, NP-C [facility] Pager: [phone] Office: [phone]  03/04/18  11:17 PM

## 2018-03-04 NOTE — Assessment & Plan Note (Signed)
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 Since stopping antibiotics he has had ongoing improvement and resolution of symptoms. I told him and his wife today that he seems to be cured of his infection. No need to check labwork today with excellent clinical response. Can follow up as needed with our team. He has podiatry appointment soon.

## 2018-03-04 NOTE — Patient Instructions (Signed)
Your foot looks great! Nice racing stripes ;)  You can follow up with podiatry and with our team as needed.   Happy Holidays!

## 2018-03-13 ENCOUNTER — Ambulatory Visit (INDEPENDENT_AMBULATORY_CARE_PROVIDER_SITE_OTHER): Payer: Medicare HMO | Admitting: Podiatry

## 2018-03-13 DIAGNOSIS — M79676 Pain in unspecified toe(s): Secondary | ICD-10-CM | POA: Diagnosis not present

## 2018-03-13 DIAGNOSIS — B351 Tinea unguium: Secondary | ICD-10-CM

## 2018-03-17 NOTE — Progress Notes (Signed)
   SUBJECTIVE Patient presents to office today complaining of elongated, thickened nails that cause pain while ambulating in shoes. He is unable to trim his own nails. Patient is here for further evaluation and treatment.  Past Medical History:  Diagnosis Date  . Bronchitis    chronic bronchitis  . COPD (chronic obstructive pulmonary disease) (HCC)   . Diabetes mellitus without complication (HCC)   . Elevated lactic acid level   . Hypertension   . PONV (postoperative nausea and vomiting)    post op nausea  . 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   . Sleep apnea    cpap at night    OBJECTIVE General Patient is awake, alert, and oriented x 3 and in no acute distress. Derm Skin is dry and supple bilateral. Negative open lesions or macerations. Remaining integument unremarkable. Nails are tender, long, thickened and dystrophic with subungual debris, consistent with onychomycosis, 1-5 bilateral. No signs of infection noted. Vasc  DP and PT pedal pulses palpable bilaterally. Temperature gradient within normal limits.  Neuro Epicritic and protective threshold sensation grossly intact bilaterally.  Musculoskeletal Exam No symptomatic pedal deformities noted bilateral. Muscular strength within normal limits.  ASSESSMENT 1. Onychodystrophic nails 1-5 bilateral with hyperkeratosis of nails.  2. Onychomycosis of nail due to dermatophyte bilateral 3. Pain in foot bilateral  PLAN OF CARE 1. Patient evaluated today.  2. Instructed to maintain good pedal hygiene and foot care.  3. Mechanical debridement of nails 1-5 bilaterally performed using a nail nipper. Filed with dremel without incident.  4. Return to clinic in 3 mos.    Felecia ShellingBrent M. Ridley Dileo, DPM [facility]  Dr. Felecia ShellingBrent M. Petronella Shuford, DPM    [address]                                          [place], [place] [id number]                Office [phone]  Fax [phone]

## 2018-05-08 ENCOUNTER — Encounter: Payer: Self-pay | Admitting: Cardiology

## 2018-05-08 ENCOUNTER — Ambulatory Visit: Payer: Medicare HMO | Admitting: Cardiology

## 2018-05-08 VITALS — BP 146/85 | HR 49 | Ht 72.0 in | Wt 356.7 lb

## 2018-05-08 DIAGNOSIS — E782 Mixed hyperlipidemia: Secondary | ICD-10-CM

## 2018-05-08 DIAGNOSIS — I129 Hypertensive chronic kidney disease with stage 1 through stage 4 chronic kidney disease, or unspecified chronic kidney disease: Secondary | ICD-10-CM | POA: Diagnosis not present

## 2018-05-08 DIAGNOSIS — E1122 Type 2 diabetes mellitus with diabetic chronic kidney disease: Secondary | ICD-10-CM

## 2018-05-08 DIAGNOSIS — I482 Chronic atrial fibrillation, unspecified: Secondary | ICD-10-CM | POA: Diagnosis not present

## 2018-05-08 DIAGNOSIS — I119 Hypertensive heart disease without heart failure: Secondary | ICD-10-CM | POA: Insufficient documentation

## 2018-05-08 DIAGNOSIS — I2510 Atherosclerotic heart disease of native coronary artery without angina pectoris: Secondary | ICD-10-CM | POA: Insufficient documentation

## 2018-05-08 DIAGNOSIS — I5032 Chronic diastolic (congestive) heart failure: Secondary | ICD-10-CM | POA: Diagnosis not present

## 2018-05-08 DIAGNOSIS — R600 Localized edema: Secondary | ICD-10-CM

## 2018-05-08 DIAGNOSIS — N183 Chronic kidney disease, stage 3 (moderate): Secondary | ICD-10-CM

## 2018-05-08 MED ORDER — FUROSEMIDE 20 MG PO TABS: 20.0000 mg | ORAL_TABLET | Freq: Every day | ORAL | 3 refills | Status: DC

## 2018-05-08 MED ORDER — METOPROLOL SUCCINATE ER 25 MG PO TB24: 25.0000 mg | ORAL_TABLET | Freq: Every day | ORAL | 3 refills | Status: DC

## 2018-05-08 MED ORDER — METOPROLOL TARTRATE 25 MG PO TABS: 25.0000 mg | ORAL_TABLET | Freq: Two times a day (BID) | ORAL | 3 refills | Status: DC

## 2018-05-08 NOTE — Progress Notes (Addendum)
Subjective:   @Patient  ID: William Cameron, male    DOB: [birth year], 68 y.o.   MRN: [id number]  Chief Complaint  Patient presents with  . Atrial Fibrillation    f/u , pt c/o DOE, out of rhythm, shakes    HPI  [name] is a 68 year old male with a medical history significant for permanent atril fibrillation, hyperlipidemia with intolerance to statins, diabetes, hypertension, morbid obesity with obstructive sleep apnea does not use CPAP regularly follows Dr. Brett Fairy, and severe 3 vessel CAD by coronary angiogram in Nov 2017 and recommended medical therapy due to diffuse disease and patient preference who presents for a follow-up for atrial fibrillation. His last office visit was 11/16/17.   Last office visit, patient was recently seen in the hospital for infection of the left foot after surgery. His foot is fully recovered, but he was immobile for 8 months after his operation. Patient admits that he has increased his activity mildly with increased shortness or breath and increased exercise intolerance.   Patient notes that his blood pressure and glucose levels have been very variant over the past few months due to his severe infection, but have begun to normalize. Tolerating anticoagulation well with no signs of bleeding. Obesity continues to be a major problem.   He denies any chest pain, palpitations, PND, orthopnea, edema, dizziness, syncope, or symptoms suggestive of claudication or TIA.  Past Medical History:  Diagnosis Date  . Atrial fibrillation ([place])   . Bronchitis    chronic bronchitis  . COPD (chronic obstructive pulmonary disease) ([place])   . Diabetes mellitus without complication ([place])   . Elevated lactic acid level   . Hypertension   . PONV (postoperative nausea and vomiting)    post op nausea  . 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   [name]   . Sleep  apnea    cpap at night    Past Surgical History:  Procedure Laterality Date  . CARDIOVERSION N/A 01/25/2016   Procedure: CARDIOVERSION;  Surgeon: Adrian Prows, MD;  Location: [facility] ENDOSCOPY;  Service: Cardiovascular;  Laterality: N/A;  . COLONOSCOPY  02/02/2012   Procedure: COLONOSCOPY;  Surgeon: Beryle Beams, MD;  Location: [facility] ENDOSCOPY;  Service: Endoscopy;  Laterality: N/A;  . HERNIA REPAIR    . HYDROCELE EXCISION / REPAIR    . I&D EXTREMITY Left 11/24/2017   Procedure: IRRIGATION AND DEBRIDEMENT LEFT FOOT, REMOVAL OF TWO SCREWS, INSERTION OF ANTIBIOTIC BEADS;  Surgeon: Edrick Kins, DPM;  Location: [place];  Service: Podiatry;  Laterality: Left;  . JOINT REPLACEMENT     rt knee  . ROTATOR CUFF REPAIR  rt  . TOTAL HIP ARTHROPLASTY Right 07/14/2014   Procedure: RIGHT TOTAL HIP ARTHROPLASTY;  Surgeon: Paralee Cancel, MD;  Location: [facility] ORS;  Service: Orthopedics;  Laterality: Right;    Social History   Socioeconomic History  . Marital status: Married    Spouse name: Judeen Hammans  . Number of children: 1  . Years of education: 12+  . Highest education level: Not on file  Occupational History    Employer: [employer]  . Financial resource strain: Not on file  . Food insecurity:    Worry: Not on file    Inability: Not on file  . Transportation needs:    Medical: Not on file    Non-medical: Not on  file  Tobacco Use  . Smoking status: Never Smoker  . Smokeless tobacco: Never Used  Substance and Sexual Activity  . Alcohol use: No  . Drug use: No  . Sexual activity: Never  Lifestyle  . Physical activity:    Days per week: Not on file    Minutes per session: Not on file  . Stress: Not on file  Relationships  . Social connections:    Talks on phone: Not on file    Gets together: Not on file    Attends religious service: Not on file    Active member of club or organization: Not on file    Attends meetings of clubs or organizations: Not on file    Relationship  status: Not on file  . Intimate partner violence:    Fear of current or ex partner: Not on file    Emotionally abused: Not on file    Physically abused: Not on file    Forced sexual activity: Not on file  Other Topics Concern  . Not on file  Social History Narrative   Patient is married Judeen Hammans) and lives at home with his wife.   Patient has one adult child.   Patient is working full-time.   Patient has a high school and auto classes.   Patient is right-handed.   Patient drinks two cups of coffee every morning and very little tea.    Current Outpatient Medications on File Prior to Visit  Medication Sig Dispense Refill  . amLODipine (NORVASC) 5 MG tablet Take 5 mg by mouth daily.    Marland Kitchen apixaban (ELIQUIS) 5 MG TABS tablet Take 5 mg by mouth 2 (two) times daily.    . benazepril (LOTENSIN) 40 MG tablet Take 40 mg by mouth daily after supper.     . Cholecalciferol (VITAMIN D) 2000 units tablet Take 2,000 Units by mouth daily.     . dapagliflozin propanediol (FARXIGA) 5 MG TABS tablet Take 5 mg by mouth 2 (two) times daily.    Marland Kitchen ibuprofen (ADVIL,MOTRIN) 200 MG tablet Take 200 mg by mouth at bedtime.     . metoprolol succinate (TOPROL-XL) 50 MG 24 hr tablet Take 50 mg by mouth daily after supper. Take with or immediately following a meal.     . nystatin cream (MYCOSTATIN) APPLY CREAM TOPICALLY TO AFFECTED AREA TWICE DAILY  1  . PRESCRIPTION MEDICATION Inhale into the lungs at bedtime. CPAP    . fluconazole (DIFLUCAN) 100 MG tablet Take 100 mg by mouth daily.  0   No current facility-administered medications on file prior to visit.     Direct current cardioversion 01/25/2016: 120 x1, 150x1 to NSR.  Echocardiogram 01/04/2016: Left ventricle cavity is normal in size. Mild to moderate concentric hypertrophy of the left ventricle. Normal global wall motion. Unable to evaluate diastolic function due to A. Fibrillation. Calculated EF 64%. Left atrial cavity is moderately dilated at 4.8  cm. Right atrial cavity is mildly dilated. Right ventricle is not well visualized. The cavity is mildly dilated. Normal right ventricular function. A moderateor band is noted at the RV apex (normal variant). Mild tricuspid regurgitation. No evidence of pulmonary hypertension.  Exercise sestamibi stress test 12/27/2015: 1. Two day protocol followed. The resting electrocardiogram demonstrated normal sinus rhythm, incomplete RBBB and no resting arrhythmias. Low voltage and poor R progression. The stress electrocardiogram was normal. Patient exercised on Bruce protocol for 4:13 minutes and achieved 4.84 METS. Stress test terminated due to % MPHR achieved (Target HR >  85%). Symptoms included dyspnea. 2. The perfusion imaging study demonstrates diaphragmatic attenuation artifact in the inferior wall. There is no demonstrable ischemia or scar. LV systolic function was normal at 64%. This is a low risk study. Impression: EKG 11/13/2017: Atrial fibrillation with controlled ventricular response at the rate of 66 bpm, leftward axis, poor R-wave progression, cannot exclude anterolateral infarct old. Low-voltage complexes. Nonspecific T abnormality. No significant change from EKG 11/15/2016.  Coronary angiogram 01/24/2016: Mid LAD occluded, D2 small to moderate sized proximal 80%. Mid Cx occluded with moderate OM1 which bifurcates. Both calcific CTO. RCA PL 1 has 80% stenosis (small) and PL3 occluded, mod-large vessel. LVEF 50-55% with apical akinesis.   Review of Systems  Constitutional: Positive for fatigue. Negative for unexpected weight change.  HENT: Negative for congestion.   Eyes: Negative for visual disturbance.  Respiratory: Positive for shortness of breath (on exertion with decreased exercise endurance). Negative for cough and wheezing.   Cardiovascular: Positive for chest pain (feels like indigestion at night) and leg swelling. Negative for palpitations.  Gastrointestinal: Negative for abdominal  pain, nausea and vomiting.  Endocrine: Negative for cold intolerance.  Genitourinary: Negative for dysuria.  Musculoskeletal: Negative for myalgias.  Skin: Positive for color change (color changes of feet). Negative for rash.  Allergic/Immunologic: Negative for immunocompromised state.  Neurological: Positive for tremors (hand tremor x1 month). Negative for dizziness.  Hematological: Does not bruise/bleed easily.  Psychiatric/Behavioral: The patient is not nervous/anxious.   All other systems reviewed and are negative.     Objective: Blood pressure (!) 146/85, pulse (!) 49, height 6' (1.829 m), weight (!) 356 lb 11.2 oz (161.8 kg), SpO2 94 %.     Physical Exam Vitals signs reviewed.  Constitutional:      General: He is not in acute distress.    Appearance: Normal appearance. He is morbidly obese.  HENT:     Head: Normocephalic.  Neck:     Musculoskeletal: Neck supple.     Thyroid: No thyromegaly.     Vascular: No carotid bruit.  Cardiovascular:     Rate and Rhythm: Bradycardia present. Rhythm irregular.     Pulses: Normal pulses.          Radial pulses are 2+ on the right side and 2+ on the left side.       Dorsalis pedis pulses are 2+ on the right side and 2+ on the left side.       Posterior tibial pulses are 2+ on the right side and 2+ on the left side.     Heart sounds: No murmur. No friction rub. No gallop.      Comments: S1 variable S2 WNL. Femoral & Popliteal pulses difficult to evaluate due to body habitus Pulmonary:     Effort: Pulmonary effort is normal. No respiratory distress.     Breath sounds: Normal breath sounds. No wheezing, rhonchi or rales.  Abdominal:     General: There is no abdominal bruit.     Palpations: There is no pulsatile mass.  Musculoskeletal:     Right lower leg: No edema.     Left lower leg: No edema.  Skin:    General: Skin is warm.  Neurological:     General: No focal deficit present.     Mental Status: He is alert.       Assessment  & Recommendations:  1.  Chronic atrial fibrillation, rate controlled: EKG 05/08/2018: Atrial flutter ablation controlled ventricular response, ventricular rate 50 bpm.  Left axis deviation,  cannot extrude inferior infarct old.  Anteroseptal infarct old.  Low-voltage complexes.  Pulmonary disease pattern.  Patient demonstrated bradycardia in clinic today at a rate of 49 and continuous to be a chronic issue. Metoprolol has been decreased to 92m once a day to prevent symptomatic bradycardia. Patient is tolerating anticoagulation well with no signs of bleeding and will continue current regimen.   2. CAD of the native vessel with other forms of angina pectoris. Coronary angiogram 01/24/2016: Mid LAD occluded, D2 small to moderate sized proximal 80%. Mid Cx occluded with moderate OM1 which bifurcates. Both calcific CTO. RCA PL 1 has 80% stenosis (small) and PL3 occluded, mod-large vessel. LVEF 50-55% with apical akinesis.   3. Mixed Hyperlipidemia: Continue lipid monitoring with PCP.  4. Chronic Diastolic heart failure:  5. Diabetes mellitus type II uncontrolled not on insulin: Labs 04/19/2018: Total cholesterol 219, triglycerides 175, HDL 42, LDL 142.  HB 16.2/HCT 50.1, platelets 167.  Potassium 4.8, BUN 21, creatinine 1.33, eGFR 55 mL.  A1c 8.0%.  Recommendations:  Patient admits to increased shortness of breath with even light activity. Shortness of breath is most likely multifactorial given patient's BMI, immobilization for the past 8 months, and chronic diastolic heart failure. Echocardiogram will be ordered to assess heart function further. Patient has been prescribed Lasix 225mdaily to decrease peripheral edema and better control blood pressure. Blood pressure in clinic today was 146/85. Patient has been advised to stop taking ibuprofen daily and to limit it to only when needed.  If the symptoms of dyspnea did not improve with continued physical activity and making lifestyle changes, then I'll  consider repeating coronary angiography in view of multivessel disease.  Stress test may not be very optimal.  With regard to peripheral arterial disease, His nonhealing/slow healing left surgical wound which is fortunately now well-healed is related to sepsis, MRSA and probably uncontrolled diabetes contributing. There is no evidence of arterial insufficiency. He has still significant leg edema. He also has superficial varicosities, he may have venous insufficiency onset.  Patient will follow-up in office in 6-8 weeks to reevaluate his symptoms and discuss echocardiogram results. Patient has been advised to start more frequent physical activity, preferably in a pool.   JaAdrian ProwsMD, FAHiLLCrest [facility] [place]/02/2019, 8:50 PM [facility]. PAChatmossager: [phone] Office: [phone] no answer Cell [phone]

## 2018-05-13 ENCOUNTER — Ambulatory Visit: Payer: Medicare HMO

## 2018-05-13 DIAGNOSIS — I482 Chronic atrial fibrillation, unspecified: Secondary | ICD-10-CM

## 2018-05-13 DIAGNOSIS — I5032 Chronic diastolic (congestive) heart failure: Secondary | ICD-10-CM

## 2018-05-14 ENCOUNTER — Other Ambulatory Visit: Payer: Self-pay | Admitting: Cardiology

## 2018-05-14 ENCOUNTER — Other Ambulatory Visit: Payer: Self-pay

## 2018-05-14 ENCOUNTER — Telehealth: Payer: Self-pay | Admitting: Cardiology

## 2018-05-14 MED ORDER — TRAMADOL HCL 50 MG PO TABS: 50.0000 mg | ORAL_TABLET | Freq: Two times a day (BID) | ORAL | 1 refills | Status: AC | PRN

## 2018-05-14 NOTE — Telephone Encounter (Signed)
Spoke with Pt and made him aware.

## 2018-05-14 NOTE — Telephone Encounter (Signed)
Echo shows normal heart function. The right side of the heart is enlarged due to obesity and lung issues and may cause shortness of breath.  I am trying to send Rx for Ultram and there is prescriber issues and will probably resolve today and will send for pain.

## 2018-05-28 ENCOUNTER — Other Ambulatory Visit: Payer: Self-pay

## 2018-06-12 ENCOUNTER — Ambulatory Visit: Payer: Medicare HMO | Admitting: Podiatry

## 2018-06-17 ENCOUNTER — Ambulatory Visit: Payer: Medicare HMO | Admitting: Podiatry

## 2018-06-21 ENCOUNTER — Other Ambulatory Visit: Payer: Self-pay

## 2018-06-21 ENCOUNTER — Encounter: Payer: Self-pay | Admitting: Cardiology

## 2018-06-21 ENCOUNTER — Ambulatory Visit: Payer: Medicare HMO | Admitting: Cardiology

## 2018-06-21 VITALS — BP 127/85 | HR 56 | Ht 70.0 in | Wt 350.0 lb

## 2018-06-21 DIAGNOSIS — I5022 Chronic systolic (congestive) heart failure: Secondary | ICD-10-CM

## 2018-06-21 DIAGNOSIS — R600 Localized edema: Secondary | ICD-10-CM

## 2018-06-21 DIAGNOSIS — I110 Hypertensive heart disease with heart failure: Secondary | ICD-10-CM

## 2018-06-21 DIAGNOSIS — I5032 Chronic diastolic (congestive) heart failure: Secondary | ICD-10-CM

## 2018-06-21 DIAGNOSIS — I5042 Chronic combined systolic (congestive) and diastolic (congestive) heart failure: Secondary | ICD-10-CM

## 2018-06-21 DIAGNOSIS — I482 Chronic atrial fibrillation, unspecified: Secondary | ICD-10-CM

## 2018-06-21 MED ORDER — RANOLAZINE ER 1000 MG PO TB12: 1000.0000 mg | ORAL_TABLET | Freq: Two times a day (BID) | ORAL | 1 refills | Status: DC

## 2018-06-21 NOTE — Progress Notes (Signed)
Subjective:   _0  ID: William Cameron, male    DOB: 09/04/1950, 68 y.o.   MRN: [id number]  Chief Complaint  Patient presents with  . Shortness of Breath  . Follow-up  . Fatigue    HPI: William Cameron is a 68 year old male with a medical history significant for permanent atril fibrillation, hyperlipidemia with intolerance to statins, diabetes, hypertension, morbid obesity with obstructive sleep apnea does not use CPAP regularly follows Dr. Brett Cameron, and severe 3 vessel CAD by coronary angiogram in Nov 2017 and recommended medical therapy due to diffuse disease and patient preference.  This is his 4-6 week follow-up of worsening dyspnea and fatigue.  Over the past 6-8 months he has been very immobile due to development of MRSA infection in his left foot after the surgery.  His physical activity was significantly curtailed and he also gained weight. Fortunately he has healed the wound. On his last office visit we had added furosemide to be taken on a daily basis and then changed to p.r.n. use, he is noticed improvement in leg edema.  Fatigue and dyspnea still persist. He denies any chest pain, palpitations, PND, orthopnea, edema, dizziness, syncope, or symptoms suggestive of claudication or TIA.  Patient notes that his blood pressure and glucose levels have been very variant over the past few months due to his severe infection, but have begun to normalize. Tolerating anticoagulation well with no signs of bleeding. Obesity continues to be a major problem.   Past Medical History:  Diagnosis Date  . Atrial fibrillation ([place])   . Bronchitis    chronic bronchitis  . COPD (chronic obstructive pulmonary disease) ([place])   . Diabetes mellitus without complication ([place])   . Elevated lactic acid level   . Hypertension   . PONV (postoperative nausea and vomiting)    post op nausea  . Severe obesity (BMI >= 40) (HCC) 09/03/2013   patien tlost 55 pounds, from 450 pounds, arthritism neuropathy, diabetes ,  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   William Cameron   . Sleep apnea    cpap at night    Past Surgical History:  Procedure Laterality Date  . CARDIOVERSION N/A 01/25/2016   Procedure: CARDIOVERSION;  Surgeon: Adrian Prows, MD;  Location[facility] ENDOSCOPY;  Service: Cardiovascular;  Laterality: N/A;  . COLONOSCOPY  02/02/2012   Procedure: COLONOSCOPY;  Surgeon: Beryle Beams, MD;  Location: [facility] ENDOSCOPY;  Service: Endoscopy;  Laterality: N/A;  . HERNIA REPAIR    . HYDROCELE EXCISION / REPAIR    . I&D EXTREMITY Left 11/24/2017   Procedure: IRRIGATION AND DEBRIDEMENT LEFT FOOT, REMOVAL OF TWO SCREWS, INSERTION OF ANTIBIOTIC BEADS;  Surgeon: Edrick Kins, DPM;  Location: [facility];  Service: Podiatry;  Laterality: Left;  . JOINT REPLACEMENT     rt knee  . ROTATOR CUFF REPAIR  rt  . TOTAL HIP ARTHROPLASTY Right 07/14/2014   Procedure: RIGHT TOTAL HIP ARTHROPLASTY;  Surgeon: Paralee Cancel, MD;  Location: [facility] ORS;  Service: Orthopedics;  Laterality: Right;    Social History   Socioeconomic History  . Marital status: Married    Spouse name: William Cameron  . Number of children: 1  . Years of education: 12+  . Highest education level: Not on file  Occupational History    Employer: [employer]  . Financial resource strain: Not on file  . Food insecurity:    Worry: Not on file    Inability: Not on file  . Transportation  needs:    Medical: Not on file    Non-medical: Not on file  Tobacco Use  . Smoking status: Never Smoker  . Smokeless tobacco: Never Used  Substance and Sexual Activity  . Alcohol use: No  . Drug use: No  . Sexual activity: Never  Lifestyle  . Physical activity:    Days per week: Not on file    Minutes per session: Not on file  . Stress: Not on file  Relationships  . Social connections:    Talks on phone: Not on file    Gets together: Not on file    Attends religious service: Not on file    Active member of club or organization: Not on file    Attends meetings  of clubs or organizations: Not on file    Relationship status: Not on file  . Intimate partner violence:    Fear of current or ex partner: Not on file    Emotionally abused: Not on file    Physically abused: Not on file    Forced sexual activity: Not on file  Other Topics Concern  . Not on file  Social History Narrative   Patient is married William Cameron) and lives at home with his wife.   Patient has one adult child.   Patient is working full-time.   Patient has a high school and auto classes.   Patient is right-handed.   Patient drinks two cups of coffee every morning and very little tea.    Current Outpatient Medications on File Prior to Visit  Medication Sig Dispense Refill  . amLODipine (NORVASC) 5 MG tablet Take 5 mg by mouth daily.    Marland Kitchen apixaban (ELIQUIS) 5 MG TABS tablet Take 5 mg by mouth 2 (two) times daily.    . benazepril (LOTENSIN) 40 MG tablet Take 40 mg by mouth daily after supper.     . Cholecalciferol (VITAMIN D) 2000 units tablet Take 2,000 Units by mouth daily.     . dapagliflozin propanediol (FARXIGA) 5 MG TABS tablet Take 5 mg by mouth 2 (two) times daily.    . fluconazole (DIFLUCAN) 100 MG tablet Take 100 mg by mouth daily.  0  . furosemide (LASIX) 20 MG tablet Take 1 tablet (20 mg total) by mouth daily. 90 tablet 3  . metoprolol succinate (TOPROL-XL) 25 MG 24 hr tablet Take 1 tablet (25 mg total) by mouth daily. Take with or immediately following a meal. 90 tablet 3  . traMADol (ULTRAM) 50 MG tablet Take 1 tablet (50 mg total) by mouth every 12 (twelve) hours as needed. 60 tablet 1  . ibuprofen (ADVIL,MOTRIN) 200 MG tablet Take 200 mg by mouth at bedtime.     Marland Kitchen nystatin cream (MYCOSTATIN) APPLY CREAM TOPICALLY TO AFFECTED AREA TWICE DAILY  1  . PRESCRIPTION MEDICATION Inhale into the lungs at bedtime. CPAP     No current facility-administered medications on file prior to visit.     Direct current cardioversion 01/25/2016: 120 x1, 150x1 to NSR.  Echocardiogram   05/13/2018:  Left ventricle cavity is normal in size. Normal global wall motion. Unable to evaluate diastolic function due to A. Fibrillation. Calculated EF 59%. Left atrial cavity is severely dilated at 5.4 cm. Right ventricle cavity is mild to moderately dilated. Mildly reduced right ventricular function. A moderateor band is noted at the RV apex (normal variant). Trace mitral regurgitation. Mild calcification of the mitral valve annulus. Mild mitral valve leaflet thickening. Trace tricuspid regurgitation. Unable to estimate PA  pressure due to absence/minimal TR signal. IVC is dilated with poor inspiration collapse consistent with elevated right atrial pressure. Compared to the study done on 01/04/2016, no significant change.  Exercise sestamibi stress test 12/27/2015: 1. Two day protocol followed. The resting electrocardiogram demonstrated normal sinus rhythm, incomplete RBBB and no resting arrhythmias. Low voltage and poor R progression. The stress electrocardiogram was normal. Patient exercised on Bruce protocol for 4:13 minutes and achieved 4.84 METS. Stress test terminated due to % MPHR achieved (Target HR >85%). Symptoms included dyspnea. 2. The perfusion imaging study demonstrates diaphragmatic attenuation artifact in the inferior wall. There is no demonstrable ischemia or scar. LV systolic function was normal at 64%. This is a low risk study. Impression: EKG 11/13/2017: Atrial fibrillation with controlled ventricular response at the rate of 66 bpm, leftward axis, poor R-wave progression, cannot exclude anterolateral infarct old. Low-voltage complexes. Nonspecific T abnormality. No significant change from EKG 11/15/2016.  Coronary angiogram 01/24/2016: Mid LAD occluded, D2 small to moderate sized proximal 80%. Mid Cx occluded with moderate OM1 which bifurcates. Both calcific CTO. RCA PL 1 has 80% stenosis (small) and PL3 occluded, mod-large vessel. LVEF 50-55% with apical akinesis.    Review of Systems  Constitutional: Positive for fatigue. Negative for unexpected weight change.  HENT: Negative for congestion.   Eyes: Negative for visual disturbance.  Respiratory: Positive for shortness of breath. Negative for cough and wheezing.   Cardiovascular: Positive for chest pain (feels like indigestion at night) and leg swelling. Negative for palpitations.  Gastrointestinal: Negative for abdominal pain, nausea and vomiting.  Endocrine: Negative for cold intolerance.  Genitourinary: Negative for dysuria.  Musculoskeletal: Negative for myalgias.  Skin: Positive for color change (color changes of feet). Negative for rash.  Allergic/Immunologic: Negative for immunocompromised state.  Neurological: Positive for tremors (hand tremor x1 month). Negative for dizziness.  Hematological: Does not bruise/bleed easily.  Psychiatric/Behavioral: The patient is not nervous/anxious.   All other systems reviewed and are negative.     Objective: Blood pressure 127/85, pulse (!) 56, height _0  (1.778 m), weight (!) 350 lb (158.8 kg), SpO2 96 %.     Physical Exam Vitals signs reviewed.  Constitutional:      General: He is not in acute distress.    Appearance: Normal appearance. He is morbidly obese.  HENT:     Head: Normocephalic.  Neck:     Musculoskeletal: Neck supple.     Thyroid: No thyromegaly.     Vascular: No carotid bruit.  Cardiovascular:     Rate and Rhythm: Bradycardia present. Rhythm irregular.     Pulses: Normal pulses.          Radial pulses are 2+ on the right side and 2+ on the left side.       Dorsalis pedis pulses are 2+ on the right side and 2+ on the left side.       Posterior tibial pulses are 2+ on the right side and 2+ on the left side.     Heart sounds: No murmur. No friction rub. No gallop.      Comments: S1 variable S2 WNL. Femoral & Popliteal pulses difficult to evaluate due to body habitus Pulmonary:     Effort: Pulmonary effort is normal. No respiratory  distress.     Breath sounds: Normal breath sounds. No wheezing, rhonchi or rales.  Abdominal:     General: There is no abdominal bruit.     Palpations: There is no pulsatile mass.  Musculoskeletal:  Right lower leg: No edema.     Left lower leg: No edema.  Skin:    General: Skin is warm.  Neurological:     General: No focal deficit present.     Mental Status: He is alert.       Assessment & Recommendations:  Atrial fibrillation, chronic  Chronic diastolic (congestive) heart failure (HCC)  Hypertensive heart disease with chronic systolic congestive heart failure (HCC)  Bilateral leg edema CHA2DS2-VASc Score is 4.  -(CHF; HTN; vasc disease DM,  Male = 1; Age <65 =0; 65-74 = 1,  >75 =2; stroke = 2).  -(Yearly risk of stroke: Score of 1=1.3; 2=2.2; 3=3.2; 4=4; 5=6.7; 6=9.8; 7=>9.8)   EKG 05/08/2018: Atrial flutter ablation controlled ventricular response, ventricular rate 50 bpm.  Left axis deviation, cannot extrude inferior infarct old.  Anteroseptal infarct old.  Low-voltage complexes.  Pulmonary disease pattern.   Coronary angiogram 01/24/2016: Mid LAD occluded, D2 small to moderate sized proximal 80%. Mid Cx occluded with moderate OM1 which bifurcates. Both calcific CTO. RCA PL 1 has 80% stenosis (small) and PL3 occluded, mod-large vessel. LVEF 50-55% with apical akinesis.   Laboratory examination:  Labs 04/19/2018: Total cholesterol 219, triglycerides 175, HDL 42, LDL 142.  HB 16.2/HCT 50.1, platelets 167.  Potassium 4.8, BUN 21, creatinine 1.33, eGFR 55 mL.  A1c 8.0%.  Recommendations:  Patient admits to increased shortness of breath with even light activity. Shortness of breath is most likely multifactorial given patient's BMI, immobilization for the past 8 months, and chronic diastolic heart failure. Echocardiogram remains unchanged without change in LVEF.    I suspect a large complement office symptoms are related to deconditioning as over the past 6-8 months he was  so immobile.  I'll start him on Ranexa 1000 mg, one half tablet b.i.d. and can increase to 1000 mg b.i.d. to see if there is any improvement in his symptoms. Blood pressure is well controlled, no acute decompensated heart failure, leg edema has improved since being on furosemide.  Patient will follow-up in office in 6-8 weeks to reevaluate his symptoms. Weight loss strategies discussed. Increase physical activity and change in diet discussed. Wife is present in all questions answered.  I have discussed with patient regarding the safety during COVID Pandemic and steps and precautions to be taken including social distancing, frequent hand wash and use of detergent soap, gels with the patient. Also encouraged regular walking around the neighborhood and exercise and regular diet.  "Total time spent with patient was  45 minutes and greater than 50% of that time was spent in counseling and coordination care with the patient and/or family regarding CHF, deconditioning, obesity, DM and CAD. Questions from wife answered as well.  If this fails, then will consider right and left heart catheterization. I have reassured him that there is no urgency for invasive approach.   Adrian Prows, MD, [facility] 06/25/2018, 7:12 AM [facility] [place] Pager: [phone] Office: [phone] If no answer Cell [phone]

## 2018-07-22 ENCOUNTER — Ambulatory Visit (INDEPENDENT_AMBULATORY_CARE_PROVIDER_SITE_OTHER): Payer: Medicare HMO | Admitting: Cardiology

## 2018-07-22 ENCOUNTER — Ambulatory Visit: Payer: Medicare HMO | Admitting: Infectious Diseases

## 2018-07-22 ENCOUNTER — Other Ambulatory Visit: Payer: Self-pay

## 2018-07-22 ENCOUNTER — Encounter: Payer: Self-pay | Admitting: Cardiology

## 2018-07-22 VITALS — BP 128/78 | HR 46 | Ht 70.0 in | Wt 347.0 lb

## 2018-07-22 DIAGNOSIS — I482 Chronic atrial fibrillation, unspecified: Secondary | ICD-10-CM

## 2018-07-22 DIAGNOSIS — R600 Localized edema: Secondary | ICD-10-CM | POA: Diagnosis not present

## 2018-07-22 DIAGNOSIS — I5032 Chronic diastolic (congestive) heart failure: Secondary | ICD-10-CM | POA: Diagnosis not present

## 2018-07-22 DIAGNOSIS — I25118 Atherosclerotic heart disease of native coronary artery with other forms of angina pectoris: Secondary | ICD-10-CM | POA: Diagnosis not present

## 2018-07-22 DIAGNOSIS — R0609 Other forms of dyspnea: Secondary | ICD-10-CM

## 2018-07-22 DIAGNOSIS — R0600 Dyspnea, unspecified: Secondary | ICD-10-CM

## 2018-07-22 NOTE — Progress Notes (Signed)
Virtual Visit via Telephone Note: Patient unable to use video assisted device.  This visit type was conducted due to national recommendations for restrictions regarding the COVID-19 Pandemic (e.g. social distancing).  This format is felt to be most appropriate for this patient at this time.  All issues noted in this document were discussed and addressed.  No physical exam was performed.  The patient has consented to conduct a Telehealth visit and understands insurance will be billed.   I connected with@, on 07/22/18 at  by TELEPHONE and verified that I am speaking with the correct person using two identifiers.   I discussed the limitations of evaluation and management by telemedicine and the availability of in person appointments. The patient expressed understanding and agreed to proceed.   I have discussed with patient regarding the safety during COVID Pandemic and steps and precautions to be taken including social distancing, frequent hand wash and use of detergent soap, gels with the patient. I asked the patient to avoid touching mouth, nose, eyes, ears with the hands. I encouraged regular walking around the neighborhood and exercise and regular diet, as long as social distancing can be maintained.  Primary Physician/Referring:  Jilda Panda, MD  Patient ID: William Cameron, male    DOB: Jun 18, 1950, 68 y.o.   MRN: [id number]  Chief Complaint  Patient presents with  . Chest Pain    1 month visit  . Shortness of Breath    HPI: William Cameron  is a 68 y.o. male  with permanent atril fibrillation, hyperlipidemia with intolerance to statins, diabetes mellitus wiwth stage 3 CKD, hypertension, morbid obesity with obstructive sleep apnea does not use CPAP regularly follows Dr. Brett Fairy, and severe 3 vessel CAD by coronary angiogram in Nov 2017 and recommended medical therapy due to diffuse disease and patient preference.    Patient was seen by me on 06/21/2018 for acute exacerbation of diastolic  heart failure and worsening dyspnea.  He was started on Ranexa and furosemide was continued.  I have set him up for a 1 month office visit and follow-up.  States that his dyspnea is slightly improved but still easily wears out doing routine activities at home.  He has not had any chest pain.  Leg edema has also improved.  Over the past 6-8 months he has been very immobile due to development of MRSA infection in his left foot after the surgery.  His physical activity was significantly curtailed and he also gained weight. He denies any chest pain, palpitations, PND, orthopnea, edema, dizziness, syncope, or symptoms suggestive of claudication or TIA.   Past Medical History:  Diagnosis Date  . Atrial fibrillation ([place])   . Bronchitis    chronic bronchitis  . COPD (chronic obstructive pulmonary disease) ([place])   . Diabetes mellitus without complication ([facility])   . Elevated lactic acid level   . Hypertension   . PONV (postoperative nausea and vomiting)    post op nausea  . 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   [name]   . Sleep apnea    cpap at night    Past Surgical History:  Procedure Laterality Date  . CARDIOVERSION N/A 01/25/2016   Procedure: CARDIOVERSION;  Surgeon: Adrian Prows, MD;  Location: [facility];  Service: Cardiovascular;  Laterality: N/A;  . COLONOSCOPY  02/02/2012   Procedure: COLONOSCOPY;  Surgeon: Beryle Beams, MD;  Location: [facility];  Service: Endoscopy;  Laterality: N/A;  . HERNIA REPAIR    . HYDROCELE EXCISION / REPAIR    . I&D EXTREMITY Left 11/24/2017   Procedure: IRRIGATION AND DEBRIDEMENT LEFT FOOT, REMOVAL OF TWO SCREWS, INSERTION OF ANTIBIOTIC BEADS;  Surgeon: Edrick Kins, DPM;  Location: [facility];  Service: Podiatry;  Laterality: Left;  . JOINT REPLACEMENT     rt knee  . ROTATOR CUFF REPAIR  rt  . TOTAL HIP ARTHROPLASTY Right  07/14/2014   Procedure: RIGHT TOTAL HIP ARTHROPLASTY;  Surgeon: Paralee Cancel, MD;  Location: [facility];  Service: Orthopedics;  Laterality: Right;    Social History   Socioeconomic History  . Marital status: Married    Spouse name: Judeen Hammans  . Number of children: 1  . Years of education: 12+  . Highest education level: Not on file  Occupational History    Employer: [employer]  . Financial resource strain: Not on file  . Food insecurity:    Worry: Not on file    Inability: Not on file  . Transportation needs:    Medical: Not on file    Non-medical: Not on file  Tobacco Use  . Smoking status: Never Smoker  . Smokeless tobacco: Never Used  Substance and Sexual Activity  . Alcohol use: No  . Drug use: No  . Sexual activity: Never  Lifestyle  . Physical activity:    Days per week: Not on file    Minutes per session: Not on file  . Stress: Not on file  Relationships  . Social connections:    Talks on phone: Not on file    Gets together: Not on file    Attends religious service: Not on file    Active member of club or organization: Not on file    Attends meetings of clubs or organizations: Not on file    Relationship status: Not on file  . Intimate partner violence:    Fear of current or ex partner: Not on file    Emotionally abused: Not on file    Physically abused: Not on file    Forced sexual activity: Not on file  Other Topics Concern  . Not on file  Social History Narrative   Patient is married Judeen Hammans) and lives at home with his wife.   Patient has one adult child.   Patient is working full-time.   Patient has a high school and auto classes.   Patient is right-handed.   Patient drinks two cups of coffee every morning and very little tea.    Current Outpatient Medications on File Prior to Visit  Medication Sig Dispense Refill  . amLODipine (NORVASC) 5 MG tablet Take 5 mg by mouth daily.    Marland Kitchen apixaban (ELIQUIS) 5 MG TABS tablet Take 5 mg by mouth  2 (two) times daily.    . benazepril (LOTENSIN) 40 MG tablet Take 40 mg by mouth daily after supper.     . Cholecalciferol (VITAMIN D) 2000 units tablet Take 2,000 Units by mouth daily.     . dapagliflozin propanediol (FARXIGA) 5 MG TABS tablet Take 5 mg by mouth 2 (two) times daily.    . furosemide (LASIX) 20 MG tablet Take 1 tablet (20 mg total) by mouth daily. 90 tablet 3  . ibuprofen (ADVIL,MOTRIN) 200 MG tablet Take 200 mg by mouth at bedtime.     . metoprolol succinate (TOPROL-XL) 25 MG 24 hr tablet Take 1 tablet (25 mg  total) by mouth daily. Take with or immediately following a meal. 90 tablet 3  . nystatin cream (MYCOSTATIN) APPLY CREAM TOPICALLY TO AFFECTED AREA TWICE DAILY  1  . PRESCRIPTION MEDICATION Inhale into the lungs at bedtime. CPAP    . ranolazine (RANEXA) 1000 MG SR tablet Take 1 tablet (1,000 mg total) by mouth 2 (two) times daily. 60 tablet 1   No current facility-administered medications on file prior to visit.     Review of Systems  Constitution: Positive for malaise/fatigue. Negative for chills, decreased appetite, weight gain and weight loss (6-8lbs ).  Cardiovascular: Positive for dyspnea on exertion and leg swelling (improved on lasix). Negative for syncope.  Respiratory: Positive for sleep disturbances due to breathing (has OSA but not on CPAP). Negative for cough and sputum production.   Endocrine: Negative for cold intolerance.  Hematologic/Lymphatic: Does not bruise/bleed easily.  Skin: Positive for color change (from leg infection).  Musculoskeletal: Negative for joint swelling.  Gastrointestinal: Negative for abdominal pain, anorexia and change in bowel habit.  Neurological: Negative for headaches and light-headedness.  Psychiatric/Behavioral: Negative for depression and substance abuse.  All other systems reviewed and are negative.     Objective  Blood pressure 128/78, pulse (!) 46, height _0  (1.778 m), weight (!) 347 lb (157.4 kg). Body mass index  is 49.79 kg/m.    Physical Exam  Constitutional: He appears well-developed. No distress.  Morbidly obese  HENT:  Head: Atraumatic.  Eyes: Conjunctivae are normal.  Neck: Neck supple. No thyromegaly present.  Short neck and difficult to evaluate JVP  Cardiovascular: Normal rate, regular rhythm and normal heart sounds. Exam reveals no gallop.  No murmur heard. Pulses:      Carotid pulses are 2+ on the right side and 2+ on the left side.      Dorsalis pedis pulses are 2+ on the right side and 2+ on the left side.       Posterior tibial pulses are 2+ on the right side and 2+ on the left side.  Femoral and popliteal pulse difficult to feel due to patient's body habitus.   Pulmonary/Chest: Effort normal and breath sounds normal.  Abdominal: Soft. Bowel sounds are normal.  Obese. Pannus present  Musculoskeletal: Normal range of motion.        General: Edema (2+ pitting) present.  Neurological: He is alert.  Skin: Skin is warm and dry.  Psychiatric: He has a normal mood and affect.   Radiology: No results found.  Laboratory examination:   Labs 04/19/2018: Total cholesterol 219, triglycerides 175, HDL 42, LDL 142.  HB 16.2/HCT 50.1, platelets 167.  Potassium 4.8, BUN 21, creatinine 1.33, eGFR 55 mL.  A1c 8.0%.  CMP Latest Ref Rng & Units 11/27/2017 11/26/2017 11/25/2017  Glucose 70 - 99 mg/dL 142(H) 137(H) 176(H)  BUN 8 - 23 mg/dL 19 20 26(H)  Creatinine 0.61 - 1.24 mg/dL 1.44(H) 1.41(H) 1.54(H)  Sodium 135 - 145 mmol/L 138 137 135  Potassium 3.5 - 5.1 mmol/L 4.1 4.3 4.6  Chloride 98 - 111 mmol/L 104 107 106  CO2 22 - 32 mmol/L _1 Calcium 8.9 - 10.3 mg/dL 9.5 9.0 8.9  Total Protein 6.5 - 8.1 g/dL 6.6 6.3(L) 6.4(L)  Total Bilirubin 0.3 - 1.2 mg/dL 0.9 0.6 0.8  Alkaline Phos 38 - 126 U/L 56 48 51  AST 15 - 41 U/L _2 ALT 0 - 44 U/L _3 CBC Latest Ref Rng & Units  11/27/2017 11/26/2017 11/25/2017  WBC 4.0 - 10.5 K/uL 10.1 7.4 9.5  Hemoglobin 13.0 - 17.0 g/dL 13.5 12.4(L)  12.4(L)  Hematocrit 39.0 - 52.0 % 43.6 39.9 39.5  Platelets 150 - 400 K/uL 169 165 199   Lipid Panel     Component Value Date/Time   CHOL 166 10/10/2017 0451   TRIG 90 10/10/2017 0451   HDL 41 10/10/2017 0451   CHOLHDL 4.0 10/10/2017 0451   VLDL 18 10/10/2017 0451   LDLCALC 107 (H) 10/10/2017 0451   HEMOGLOBIN A1C Lab Results  Component Value Date   HGBA1C 7.6 (H) 10/08/2017   MPG 171.42 10/08/2017   TSH No results for input(s): TSH in the last 8760 hours.  Cardiac Studies:   Direct current cardioversion 01/25/2016: 120 x1, 150x1 to NSR.  Echocardiogram  05/13/2018:  Left ventricle cavity is normal in size. Normal global wall motion. Unable to evaluate diastolic function due to A. Fibrillation. Calculated EF 59%. Left atrial cavity is severely dilated at 5.4 cm. Right ventricle cavity is mild to moderately dilated. Mildly reduced right ventricular function. A moderateor band is noted at the RV apex (normal variant). Trace mitral regurgitation. Mild calcification of the mitral valve annulus. Mild mitral valve leaflet thickening. Trace tricuspid regurgitation. Unable to estimate PA pressure due to absence/minimal TR signal. IVC is dilated with poor inspiration collapse consistent with elevated right atrial pressure. Compared to the study done on 01/04/2016, no significant change.  Exercise sestamibi stress test 12/27/2015: 1. Two day protocol followed. The resting electrocardiogram demonstrated normal sinus rhythm, incomplete RBBB and no resting arrhythmias.  Low voltage and  poor R progression. The stress electrocardiogram was normal. Patient exercised on Bruce protocol for 4:13 minutes and achieved  4.84 METS. Stress test terminated due to % MPHR achieved (Target HR >85%). Symptoms included dyspnea. 2. The perfusion imaging study demonstrates diaphragmatic attenuation artifact in the inferior wall. There is no demonstrable ischemia or scar. LV systolic function was normal at  64%. This is a low risk study. 01/24/2016: Impression: EKG 11/13/2017: Atrial fibrillation with controlled ventricular response at the rate of 66 bpm, leftward axis, poor R-wave progression, cannot exclude anterolateral infarct old. Low-voltage complexes. Nonspecific T abnormality. No significant change from EKG 11/15/2016.  Coronary angiogram 01/24/2016: Mid LAD occluded, D2 small to moderate sized proximal 80%. Mid Cx occluded with moderate OM1 which bifurcates. Both calcific CTO. RCA PL 1 has 80% stenosis (small) and PL3 occluded, mod-large vessel. LVEF 50-55% with apical akinesis.   Assessment   Coronary artery disease of native artery of native heart with stable angina pectoris ([place]) 01/24/2016: Multivessel disease.  Occluded mid LAD, circumflex, D2 80%, PL 180%, PL 3 occluded  Chronic diastolic (congestive) heart failure ([facility])  Dyspnea on exertion  Bilateral leg edema  Atrial fibrillation, chronic CHA2DS2-VASc Score is 4. Yearly risk of stroke: 4.  EKG 05/08/2018: Atrial flutter ablation controlled ventricular response, ventricular rate 50 bpm.  Left axis deviation, cannot extrude inferior infarct old.  Anteroseptal infarct old.  Low-voltage complexes.  Pulmonary disease pattern.   Recommendations:   Patient admits to increased shortness of breath with even light activity, most likely multifactorial given patient's BMI, immobilization for the past 8-9 months, and chronic diastolic heart failure. Echocardiogram remains unchanged without change in LVEF.    With furosemide, leg edema has essentially resolved and on Ranexa 1000 mg, 1000 mg b.i.d. he has had no recurrence of chest discomfort but he is not sure whether it is helping him.  Advised him to  hold it for 3 to 4 days to see whether dyspnea would worsen and also if he would have recurrence of chest pain in the form of burning sensation in the chest.  At present continue medical therapy, if symptoms do not improve over the next 2 to  3 weeks, will consider proceeding with right and left heart catheterization to reevaluate his coronary anatomy.  He has lost about 6 to 7 pounds in weight over the past 1 month, positive reinforcement given for the same.  I did not make any other changes to his medications.     Adrian Prows, MD, [facility] 07/22/2018, [place] [facility] [place] Pager: [phone] Office: [phone] If no answer Cell [phone]

## 2018-08-28 ENCOUNTER — Other Ambulatory Visit: Payer: Self-pay

## 2018-08-28 ENCOUNTER — Encounter: Payer: Self-pay | Admitting: Cardiology

## 2018-08-28 ENCOUNTER — Ambulatory Visit: Payer: Medicare HMO | Admitting: Cardiology

## 2018-08-28 VITALS — BP 153/83 | HR 53 | Ht 71.0 in | Wt 355.0 lb

## 2018-08-28 DIAGNOSIS — I10 Essential (primary) hypertension: Secondary | ICD-10-CM

## 2018-08-28 DIAGNOSIS — R0600 Dyspnea, unspecified: Secondary | ICD-10-CM

## 2018-08-28 DIAGNOSIS — I5032 Chronic diastolic (congestive) heart failure: Secondary | ICD-10-CM

## 2018-08-28 DIAGNOSIS — I25118 Atherosclerotic heart disease of native coronary artery with other forms of angina pectoris: Secondary | ICD-10-CM | POA: Diagnosis not present

## 2018-08-28 DIAGNOSIS — R0609 Other forms of dyspnea: Secondary | ICD-10-CM

## 2018-08-28 NOTE — Progress Notes (Signed)
Primary Physician/Referring:  Jilda Panda, MD  Patient ID: [name], male    DOB: 04-Jan-1951, 68 y.o.   MRN: [id number]  Chief Complaint  Patient presents with  . Coronary Artery Disease  . Follow-up  . Hypertension    HPI: DIJUAN SLEETH  is a 68 y.o. male  with permanent atril fibrillation, hyperlipidemia with intolerance to statins, uncontrolled diabetes mellitus with stage 3 CKD, hypertension, morbid obesity with obstructive sleep apnea does not use CPAP regularly follows Dr. Brett Fairy, and severe 3 vessel CAD by coronary angiogram in Nov 2017 and recommended medical therapy due to diffuse disease.    Patient was seen by me on 07/22/2018 for acute exacerbation of diastolic heart failure and worsening dyspnea.  He was started on Ranexa and furosemide was continued.  I have set him up for a 1 month office visit and follow-up. Patient has discontinued Ranexa stating that he did not help him.  On his last visit a month ago, he had mentioned that dyspnea and improved and he has not had any further recurrence of chest pain.  He also discontinued insulin due to cost. Is tolerating amlodipine started for hypertension and also angina pectoris.  He has gained 7 pounds in weight which he had lost a month ago. States that he is back to baseline with regard to dyspnea with no recurrence of chest pain, accompanied by his wife. He denies  palpitations, PND, orthopnea, edema, dizziness, syncope, or symptoms suggestive of claudication or TIA.   Past Medical History:  Diagnosis Date  . Atrial fibrillation ([place])   . Bronchitis    chronic bronchitis  . COPD (chronic obstructive pulmonary disease) ([place])   . Diabetes mellitus without complication ([place])   . Elevated lactic acid level   . Hypertension   . PONV (postoperative nausea and vomiting)    post op nausea  . Severe obesity (BMI >= 40) ([facility]) 09/03/2013   patien tlost 55 pounds, from 450 pounds, arthritism neuropathy, diabetes , high risk for  sleep apnea. [employer] driver.   [name]   . Sleep apnea    cpap at night    Past Surgical History:  Procedure Laterality Date  . CARDIOVERSION N/A 01/25/2016   Procedure: CARDIOVERSION;  Surgeon: Adrian Prows, MD;  Location: [facility] ENDOSCOPY;  Service: Cardiovascular;  Laterality: N/A;  . COLONOSCOPY  02/02/2012   Procedure: COLONOSCOPY;  Surgeon: Beryle Beams, MD;  Location: [facility] ENDOSCOPY;  Service: Endoscopy;  Laterality: N/A;  . HERNIA REPAIR    . HYDROCELE EXCISION / REPAIR    . I&D EXTREMITY Left 11/24/2017   Procedure: IRRIGATION AND DEBRIDEMENT LEFT FOOT, REMOVAL OF TWO SCREWS, INSERTION OF ANTIBIOTIC BEADS;  Surgeon: Edrick Kins, DPM;  Location: [facility];  Service: Podiatry;  Laterality: Left;  . JOINT REPLACEMENT     rt knee  . ROTATOR CUFF REPAIR  rt  . TOTAL HIP ARTHROPLASTY Right 07/14/2014   Procedure: RIGHT TOTAL HIP ARTHROPLASTY;  Surgeon: Paralee Cancel, MD;  Location: [facility] ORS;  Service: Orthopedics;  Laterality: Right;    Social History   Socioeconomic History  . Marital status: Married    Spouse name: Judeen Hammans  . Number of children: 1  . Years of education: 12+  . Highest education level: Not on file  Occupational History    Employer: [employer]  . Financial resource strain: Not on file  . Food insecurity:    Worry: Not on file    Inability: Not on  file  . Transportation needs:    Medical: Not on file    Non-medical: Not on file  Tobacco Use  . Smoking status: Never Smoker  . Smokeless tobacco: Never Used  Substance and Sexual Activity  . Alcohol use: No  . Drug use: No  . Sexual activity: Never  Lifestyle  . Physical activity:    Days per week: Not on file    Minutes per session: Not on file  . Stress: Not on file  Relationships  . Social connections:    Talks on phone: Not on file    Gets together: Not on file    Attends religious service: Not on file    Active member of club or organization: Not on file    Attends meetings of clubs or  organizations: Not on file    Relationship status: Not on file  . Intimate partner violence:    Fear of current or ex partner: Not on file    Emotionally abused: Not on file    Physically abused: Not on file    Forced sexual activity: Not on file  Other Topics Concern  . Not on file  Social History Narrative   Patient is married Judeen Hammans) and lives at home with his wife.   Patient has one adult child.   Patient is working full-time.   Patient has a high school and auto classes.   Patient is right-handed.   Patient drinks two cups of coffee every morning and very little tea.    Current Outpatient Medications on File Prior to Visit  Medication Sig Dispense Refill  . amLODipine (NORVASC) 5 MG tablet Take 5 mg by mouth daily.    Marland Kitchen apixaban (ELIQUIS) 5 MG TABS tablet Take 5 mg by mouth 2 (two) times daily.    . benazepril (LOTENSIN) 40 MG tablet Take 40 mg by mouth daily after supper.     . Cholecalciferol (VITAMIN D) 2000 units tablet Take 2,000 Units by mouth daily.     . furosemide (LASIX) 20 MG tablet Take 1 tablet (20 mg total) by mouth daily. 90 tablet 3  . metoprolol succinate (TOPROL-XL) 25 MG 24 hr tablet Take 1 tablet (25 mg total) by mouth daily. Take with or immediately following a meal. 90 tablet 3   No current facility-administered medications on file prior to visit.     Review of Systems  Constitution: Positive for malaise/fatigue. Negative for chills, decreased appetite, weight gain and weight loss (6-8lbs ).  Cardiovascular: Positive for dyspnea on exertion and leg swelling (improved on lasix). Negative for syncope.  Respiratory: Positive for sleep disturbances due to breathing (has OSA but not on CPAP). Negative for cough and sputum production.   Endocrine: Negative for cold intolerance.  Hematologic/Lymphatic: Does not bruise/bleed easily.  Skin: Positive for color change (from leg infection).  Musculoskeletal: Negative for joint swelling.  Gastrointestinal:  Negative for abdominal pain, anorexia and change in bowel habit.  Neurological: Negative for headaches and light-headedness.  Psychiatric/Behavioral: Negative for depression and substance abuse.  All other systems reviewed and are negative.     Objective  Blood pressure (!) 153/83, pulse (!) 53, height 5' 11"  (1.803 m), weight (!) 355 lb (161 kg), SpO2 97 %. Body mass index is 49.51 kg/m.    Physical Exam  Constitutional: He appears well-developed. No distress.  Morbidly obese  HENT:  Head: Atraumatic.  Eyes: Conjunctivae are normal.  Neck: Neck supple. No thyromegaly present.  Short neck and difficult to evaluate  JVP  Cardiovascular: Normal rate, regular rhythm and normal heart sounds. Exam reveals no gallop.  No murmur heard. Pulses:      Carotid pulses are 2+ on the right side and 2+ on the left side.      Dorsalis pedis pulses are 2+ on the right side and 2+ on the left side.       Posterior tibial pulses are 2+ on the right side and 2+ on the left side.  Femoral and popliteal pulse difficult to feel due to patient's body habitus.  No edema.   Pulmonary/Chest: Effort normal and breath sounds normal.  Abdominal: Soft. Bowel sounds are normal.  Obese. Pannus present  Musculoskeletal: Normal range of motion.  Neurological: He is alert.  Skin: Skin is warm and dry.  Psychiatric: He has a normal mood and affect.   Radiology: No results found.  Laboratory examination:   Labs 04/19/2018: Total cholesterol 219, triglycerides 175, HDL 42, LDL 142.  HB 16.2/HCT 50.1, platelets 167.  Potassium 4.8, BUN 21, creatinine 1.33, eGFR 55 mL.  A1c 8.0%.  CMP Latest Ref Rng & Units 11/27/2017 11/26/2017 11/25/2017  Glucose 70 - 99 mg/dL 142(H) 137(H) 176(H)  BUN 8 - 23 mg/dL 19 20 26(H)  Creatinine 0.61 - 1.24 mg/dL 1.44(H) 1.41(H) 1.54(H)  Sodium 135 - 145 mmol/L 138 137 135  Potassium 3.5 - 5.1 mmol/L 4.1 4.3 4.6  Chloride 98 - 111 mmol/L 104 107 106  CO2 22 - 32 mmol/L 26 22 22   Calcium  8.9 - 10.3 mg/dL 9.5 9.0 8.9  Total Protein 6.5 - 8.1 g/dL 6.6 6.3(L) 6.4(L)  Total Bilirubin 0.3 - 1.2 mg/dL 0.9 0.6 0.8  Alkaline Phos 38 - 126 U/L 56 48 51  AST 15 - 41 U/L 18 17 17   ALT 0 - 44 U/L 12 13 14    CBC Latest Ref Rng & Units 11/27/2017 11/26/2017 11/25/2017  WBC 4.0 - 10.5 K/uL 10.1 7.4 9.5  Hemoglobin 13.0 - 17.0 g/dL 13.5 12.4(L) 12.4(L)  Hematocrit 39.0 - 52.0 % 43.6 39.9 39.5  Platelets 150 - 400 K/uL 169 165 199   Lipid Panel     Component Value Date/Time   CHOL 166 10/10/2017 0451   TRIG 90 10/10/2017 0451   HDL 41 10/10/2017 0451   CHOLHDL 4.0 10/10/2017 0451   VLDL 18 10/10/2017 0451   LDLCALC 107 (H) 10/10/2017 0451   HEMOGLOBIN A1C Lab Results  Component Value Date   HGBA1C 7.6 (H) 10/08/2017   MPG 171.42 10/08/2017   TSH No results for input(s): TSH in the last 8760 hours.  Cardiac Studies:   Direct current cardioversion 01/25/2016: 120 x1, 150x1 to NSR.  Echocardiogram  05/13/2018:  Left ventricle cavity is normal in size. Normal global wall motion. Unable to evaluate diastolic function due to A. Fibrillation. Calculated EF 59%. Left atrial cavity is severely dilated at 5.4 cm. Right ventricle cavity is mild to moderately dilated. Mildly reduced right ventricular function. A moderateor band is noted at the RV apex (normal variant). Trace mitral regurgitation. Mild calcification of the mitral valve annulus. Mild mitral valve leaflet thickening. Trace tricuspid regurgitation. Unable to estimate PA pressure due to absence/minimal TR signal. IVC is dilated with poor inspiration collapse consistent with elevated right atrial pressure. Compared to the study done on 01/04/2016, no significant change.  Exercise sestamibi stress test 12/27/2015: 1. Two day protocol followed. The resting electrocardiogram demonstrated normal sinus rhythm, incomplete RBBB and no resting arrhythmias.  Low voltage and  poor  R progression. The stress electrocardiogram was normal.  Patient exercised on Bruce protocol for 4:13 minutes and achieved  4.84 METS. Stress test terminated due to % MPHR achieved (Target HR >85%). Symptoms included dyspnea. 2. The perfusion imaging study demonstrates diaphragmatic attenuation artifact in the inferior wall. There is no demonstrable ischemia or scar. LV systolic function was normal at 64%. This is a low risk study. 01/24/2016: Impression: EKG 11/13/2017: Atrial fibrillation with controlled ventricular response at the rate of 66 bpm, leftward axis, poor R-wave progression, cannot exclude anterolateral infarct old. Low-voltage complexes. Nonspecific T abnormality. No significant change from EKG 11/15/2016.  Coronary angiogram 01/24/2016: Mid LAD occluded, D2 small to moderate sized proximal 80%. Mid Cx occluded with moderate OM1 which bifurcates. Both calcific CTO. RCA PL 1 has 80% stenosis (small) and PL3 occluded, mod-large vessel. LVEF 50-55% with apical akinesis.   Assessment   Chronic diastolic (congestive) heart failure (HCC)  Coronary artery disease of native artery of native heart with stable angina pectoris ([place]) 01/24/2016: Multivessel disease.  Occluded mid LAD, circumflex, D2 80%, PL 180%, PL 3 occluded  Dyspnea on exertion  Essential hypertension  EKG 05/08/2018: Atrial fibrillation with controlled ventricular response, ventricular rate 50 bpm.  Left axis deviation, cannot extrude inferior infarct old, Anteroseptal infarct old.  Low-voltage complexes.  Pulmonary disease pattern.   Recommendations:   Patient admits to increased shortness of breath with even light activity, most likely multifactorial given patient's BMI, deconditioning and chronic diastolic heart failure. Echocardiogram remains unchanged without change in LVEF.  Unfortunately patient disagrees with his wife was present that she keeps cooking large amounts of meals.  With furosemide, leg edema has essentially resolved. At present continue medical therapy, if  symptoms do not improve, will consider proceeding with right and left heart catheterization to reevaluate his coronary anatomy.  His blood pressure was elevated today, but patient states that he has had very low blood pressure at home and has had occasional episodes of dizziness associated with this.  Hence I did not make any changes. OV in 6 months.  Adrian Prows, MD, [facility] 08/29/2018, 6:50 AM [facility] [place] Pager: [phone] Office: [phone] If no answer Cell [phone]

## 2018-08-29 ENCOUNTER — Encounter: Payer: Self-pay | Admitting: Cardiology

## 2018-10-28 ENCOUNTER — Ambulatory Visit: Payer: Medicare HMO | Admitting: Adult Health

## 2018-11-05 ENCOUNTER — Telehealth: Payer: Self-pay

## 2018-11-05 DIAGNOSIS — I25118 Atherosclerotic heart disease of native coronary artery with other forms of angina pectoris: Secondary | ICD-10-CM

## 2018-11-05 MED ORDER — ATORVASTATIN CALCIUM 10 MG PO TABS: 10.0000 mg | ORAL_TABLET | Freq: Every day | ORAL | 3 refills | Status: DC

## 2018-11-05 NOTE — Telephone Encounter (Signed)
Spoke with patient he is ok with taking a statin; Please let me know what you send in so that I can get back with the patient

## 2018-11-05 NOTE — Telephone Encounter (Signed)
Sent atorvastatin 10 mg

## 2018-11-05 NOTE — Telephone Encounter (Signed)
-----   Message from Adrian Prows, MD sent at 11/04/2018  6:34 AM EDT ----- Regarding: Needs Statin He is not on any cholesterol tablets and should be on it to prevent progression of CAD. Ask him if I can send an Rx.  Lavone Nian

## 2018-11-11 ENCOUNTER — Telehealth: Payer: Self-pay

## 2018-11-11 ENCOUNTER — Other Ambulatory Visit: Payer: Self-pay | Admitting: Cardiology

## 2018-11-11 DIAGNOSIS — E785 Hyperlipidemia, unspecified: Secondary | ICD-10-CM

## 2018-11-11 NOTE — Telephone Encounter (Signed)
Pt called stating that he thinks he had an allergic reaction to Lipitor. Developed headache, flu like symptoms and sob so he stopped taking. Please review and advise.//ah

## 2018-11-11 NOTE — Telephone Encounter (Signed)
Pt has not tried crestor is willing to try though,

## 2018-11-11 NOTE — Telephone Encounter (Signed)
Has he tried Crestor (rosuvastatin) before.

## 2018-11-11 NOTE — Telephone Encounter (Signed)
10 mg daily for hyperlipidemia

## 2018-11-12 MED ORDER — ROSUVASTATIN CALCIUM 10 MG PO TABS: 10.0000 mg | ORAL_TABLET | Freq: Every day | ORAL | 3 refills | Status: DC

## 2018-11-12 NOTE — Telephone Encounter (Signed)
Pt aware of new rx sent to pharmacy

## 2018-11-18 ENCOUNTER — Telehealth: Payer: Self-pay

## 2018-11-18 NOTE — Telephone Encounter (Signed)
Pt called c/o joint mouth, dry mouth and diarrhea; Told him to stop taking it

## 2018-11-20 NOTE — Telephone Encounter (Signed)
Has he tried any of the other statins?

## 2018-11-20 NOTE — Telephone Encounter (Signed)
Sorry this was about his choleterol medication

## 2018-11-22 NOTE — Telephone Encounter (Signed)
He has not

## 2018-11-22 NOTE — Telephone Encounter (Signed)
Unable to reach patient.

## 2018-11-22 NOTE — Telephone Encounter (Signed)
20mg

## 2018-11-22 NOTE — Telephone Encounter (Signed)
What dosage?

## 2018-11-25 NOTE — Telephone Encounter (Signed)
error 

## 2018-12-09 NOTE — Progress Notes (Signed)
Patient ID: William Cameron, male   DOB: [birth year], 68 y.o.   MRN: [id number]           Reason for Appointment: Consultation for Type 2 Diabetes  Referring PCP: Ralene Ok   History of Present Illness:          Date of diagnosis of type 2 diabetes mellitus:   Around year 2000      Background history:  Patient has very poor recall about his initial diagnosis and previous treatment Only last office visit from PCP available and no other records available to review diabetes management and previous A1c results He does not know if he took metformin However in 2016 his A1c was 6% and he thinks that his blood sugars started going up only when he had staph infection in his foot in 7/19 when his A1c was 7.6 He had been taking [place] on admission  Recent history:   Most recent A1c is 9.6 done on 12/10/2018, previously 8.5 done in 09/2018      Non-insulin hypoglycemic drugs the patient is taking are: None  Current management, blood sugar patterns and problems identified:  He apparently stopped [place] a few months ago because of high out-of-pocket expense  Previously had tried Trulicity but apparently had nausea with this and not clear when this was stopped  He has a glucose monitor at home but does not know which brand and has not checked his blood sugar at all and usually does not check blood sugars regularly  His blood sugar in July was 216 with his PCP today fasting it is 241  He had been treated with insulin at some point possibly in 2019 and not clear which insulin he was taking and why it was stopped.  He apparently was taking the blood sugar and then deciding how much insulin to take  He does not complain of unusual fatigue or increased thirst  Appears to have gained weight this year  Does not have any specific diet to follow but usually eating only 1 meal a day in the evening   Usually avoiding any drinks with sugar and mostly drinking water        Side effects from  medications have been: Nausea from Trulicity                   Exercise:  Unable to do any  Glucose monitoring:  Not done   Dietician visit, most recent: Never  Weight history:  Wt Readings from Last 3 Encounters:  12/10/18 (!) 362 lb 3.2 oz (164.3 kg)  08/28/18 (!) 355 lb (161 kg)  07/22/18 (!) 347 lb (157.4 kg)    Glycemic control:   Lab Results  Component Value Date   HGBA1C 9.6 (A) 12/10/2018   HGBA1C 7.6 (H) 10/08/2017   HGBA1C 6.0 10/05/2014   Lab Results  Component Value Date   LDLCALC 107 (H) 10/10/2017   CREATININE 1.44 (H) 11/27/2017   No results found for: MICRALBCREAT  No results found for: FRUCTOSAMINE  Office Visit on 12/10/2018  Component Date Value Ref Range Status  . Hemoglobin A1C 12/10/2018 9.6* 4.0 - 5.6 % Final  . POC Glucose 12/10/2018 241* 70 - 99 mg/dl Final    Allergies as of 12/10/2018      Reactions   Penicillins Anaphylaxis   Has patient had a PCN reaction causing immediate rash, facial/tongue/throat swelling, SOB or lightheadedness with hypotension: Yes Has patient had a PCN reaction causing severe rash involving mucus membranes  or skin necrosis: Yes Has patient had a PCN reaction that required hospitalization Yes Has patient had a PCN reaction occurring within the last 10 years: No If all of the above answers are "NO", then may proceed with Cephalosporin use.   Tetanus Toxoids Anaphylaxis, Shortness Of Breath, Swelling   Sulfa Antibiotics Nausea And Vomiting, Swelling   Trulicity [dulaglutide]    nausea      Medication List       Accurate as of December 10, 2018  1:12 PM. If you have any questions, ask your nurse or doctor.        amLODipine 5 MG tablet Commonly known as: NORVASC Take 5 mg by mouth daily.   b complex vitamins tablet Take 1 tablet by mouth daily. Take 1 tablet by mouth once daily.   benazepril 40 MG tablet Commonly known as: LOTENSIN Take 40 mg by mouth daily after supper.   Biotin 2500 MCG Caps  Take 2,500 mcg by mouth daily. Take 1 tablet by mouth once daily.   Eliquis 5 MG Tabs tablet Generic drug: apixaban Take 5 mg by mouth 2 (two) times daily.   fenofibrate micronized 130 MG capsule Commonly known as: ANTARA Take 130 mg by mouth daily. Take 1 tablet by mouth once daily.   furosemide 20 MG tablet Commonly known as: LASIX Take 1 tablet (20 mg total) by mouth daily.   metoprolol succinate 25 MG 24 hr tablet Commonly known as: TOPROL-XL Take 1 tablet (25 mg total) by mouth daily. Take with or immediately following a meal.   NovoLOG Mix 70/30 FlexPen (70-30) 100 UNIT/ML FlexPen Generic drug: insulin aspart protamine - aspart 8 units before breakfast and 12 units before dinner, increase as directed Started by: Elayne Snare, MD   rosuvastatin 10 MG tablet Commonly known as: CRESTOR Take 1 tablet (10 mg total) by mouth daily.   vitamin B-12 500 MCG tablet Commonly known as: CYANOCOBALAMIN Take 2,500 mcg by mouth daily. Take 2556mcg by mouth once daily.   Vitamin D 50 MCG (2000 UT) tablet Take 2,000 Units by mouth daily.       Allergies:  Allergies  Allergen Reactions  . Penicillins Anaphylaxis    Has patient had a PCN reaction causing immediate rash, facial/tongue/throat swelling, SOB or lightheadedness with hypotension: Yes Has patient had a PCN reaction causing severe rash involving mucus membranes or skin necrosis: Yes Has patient had a PCN reaction that required hospitalization Yes Has patient had a PCN reaction occurring within the last 10 years: No If all of the above answers are "NO", then may proceed with Cephalosporin use.  . Tetanus Toxoids Anaphylaxis, Shortness Of Breath and Swelling  . Sulfa Antibiotics Nausea And Vomiting and Swelling  . Trulicity [Dulaglutide]     nausea    Past Medical History:  Diagnosis Date  . Atrial fibrillation ([place])   . Bronchitis    chronic bronchitis  . COPD (chronic obstructive pulmonary disease) ([place])   .  Diabetes mellitus without complication ([place])   . Elevated lactic acid level   . Hypertension   . PONV (postoperative nausea and vomiting)    post op nausea  . 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   [name]   . Sleep apnea    cpap at night    Past Surgical History:  Procedure Laterality Date  . CARDIOVERSION N/A 01/25/2016  Procedure: CARDIOVERSION;  Surgeon: Yates DecampJay Ganji, MD;  [facility];  Service: Cardiovascular;  Laterality: N/A;  . COLONOSCOPY  02/02/2012   Procedure: COLONOSCOPY;  Surgeon: Theda BelfastPatrick D Hung, MD;  Location: [facility] ENDOSCOPY;  Service: Endoscopy;  Laterality: N/A;  . HERNIA REPAIR    . HYDROCELE EXCISION / REPAIR    . I&D EXTREMITY Left 11/24/2017   Procedure: IRRIGATION AND DEBRIDEMENT LEFT FOOT, REMOVAL OF TWO SCREWS, INSERTION OF ANTIBIOTIC BEADS;  Surgeon: Felecia ShellingEvans, Brent M, DPM;  Location: [facility] OR;  Service: Podiatry;  Laterality: Left;  . JOINT REPLACEMENT     rt knee  . ROTATOR CUFF REPAIR  rt  . TOTAL HIP ARTHROPLASTY Right 07/14/2014   Procedure: RIGHT TOTAL HIP ARTHROPLASTY;  Surgeon: Durene RomansMatthew Olin, MD;  Location: [facility] ORS;  Service: Orthopedics;  Laterality: Right;    Family History  Problem Relation Age of Onset  . Diabetes type II Mother   . Prostate cancer Father [id number]    Social History:  reports that he has never smoked. He has never used smokeless tobacco. He reports that he does not drink alcohol or use drugs.   Review of Systems  Constitutional: Positive for weight gain. Negative for reduced appetite.  HENT: Negative for headaches.   Eyes: Negative for visual disturbance.  Cardiovascular: Negative for chest pain, leg swelling and claudication.  Gastrointestinal: Negative for nausea, constipation and diarrhea.  Endocrine: Negative for fatigue, cold intolerance and polydipsia.  Genitourinary: Positive for frequency and  nocturia.  Musculoskeletal: Negative for joint pain.  Skin: Negative for rash.  Neurological: Positive for tingling.  Psychiatric/Behavioral: Negative for insomnia.     Lipid history: He had muscle and joint pains with Lipitor and also with Crestor which he is not taking now as prescribed LDL 136 done in 09/2018 with triglycerides 151    Lab Results  Component Value Date   CHOL 166 10/10/2017   HDL 41 10/10/2017   LDLCALC 107 (H) 10/10/2017   TRIG 90 10/10/2017   CHOLHDL 4.0 10/10/2017           Hypertension: Has been present, treatment includes benazepril 40 mg  BP Readings from Last 3 Encounters:  12/10/18 120/72  08/28/18 (!) 153/83  07/22/18 128/78   Has had renal dysfunction last GFR 46  Lab Results  Component Value Date   CREATININE 1.44 (H) 11/27/2017   CREATININE 1.41 (H) 11/26/2017   CREATININE 1.54 (H) 11/25/2017    Most recent eye exam was in 2019  Most recent foot exam: 9/20  Currently known complications of diabetes: Unknown  LABS:  Office Visit on 12/10/2018  Component Date Value Ref Range Status  . Hemoglobin A1C 12/10/2018 9.6* 4.0 - 5.6 % Final  . POC Glucose 12/10/2018 241* 70 - 99 mg/dl Final    Physical Examination:  BP 120/72 (BP Location: Left Arm, Patient Position: Sitting, Cuff Size: Large)   Pulse (!) 56   Ht 5\' 11"  (1.803 m)   Wt (!) 362 lb 3.2 oz (164.3 kg)   SpO2 96%   BMI 50.52 kg/m   GENERAL:         Patient has marked generalized obesity.    HEENT:         Eye exam shows normal external appearance.  Fundus exam deferred to ophthalmologist   NECK:   There is no lymphadenopathy  Thyroid is not enlarged and no nodules felt.    LUNGS:         Chest is symmetrical. Lungs are clear to  auscultation.Marland Kitchen.   HEART:         Heart sounds:  S1 and S2 are normal. No murmur or click heard., no S3 or S4.   ABDOMEN:  Exam deferred    NEUROLOGICAL:   Ankle jerks are absent bilaterally.    Diabetic Foot Exam - Simple   Simple Foot  Form Diabetic Foot exam was performed with the following findings: Yes   Visual Inspection No deformities, no ulcerations, no other skin breakdown bilaterally: Yes See comments: Yes Sensation Testing Intact to touch and monofilament testing bilaterally: Yes Pulse Check Posterior Tibialis and Dorsalis pulse intact bilaterally: Yes Comments Scar of previous surgeries on left foot distally over the first and second toes Decreased monofilament sensation on the left first toe only on the dorsum            Vibration sense is moderately reduced in distal first toes.  MUSCULOSKELETAL:  There is no swelling or deformity of the peripheral joints.     EXTREMITIES:     There is no ankle edema, has mild edema of his left foot area.  SKIN:       No rash or lesions of concern.        ASSESSMENT:  Diabetes type 2  See history of present illness for detailed discussion of current diabetes management, blood sugar patterns and problems identified  Recent A1c indicates poor control  Current treatment regimen is none He has not been monitoring his blood sugar and has minimal knowledge about diabetes management Although he apparently did well with Marcelline DeistFarxiga he has not taken this for several months because of cost He may have been intolerant to metformin but details are not available and also reportedly has nausea with Trulicity His diet is not excessive in calories or simple sugars He is unable to exercise because of his CHF and obesity Also unable to lose weight  Complications of diabetes: Mild peripheral neuropathy, unknown status of nephropathy    PLAN:    1. Glucose monitoring: . Patient advised to start checking readings either fasting, before dinner or 2 hours after meals. He will call back to let us know which brand of meter he has and will prescribe test strips.  Discussed need for regular testing at least once every day  2.  Diabetes education: . Patient will need considerable  comprehensive diabetes education and will refer to diabetes educator  3.  Lifestyle changes: . Dietary changes: Low-fat meals  4.  Medication regimen: . Since currently he is unable to take most medications because of either cost or renal insufficiency will start on insulin using premixed insulin.  Again not clear what brand of insulin is covered by his insurance but will try NovoLog Mix 70/30, 8 units in the morning and 12 at dinner. . Given detailed instructions on how to dose the insulin as well as timing, composition of the premixed insulin.  Basic information pamphlets on insulin given . He was given instructions on adjusting the evening insulin by 2 units every 5 days until morning sugars at least under 150.  He will also be seen by diabetes educator in 2 weeks to assess his progress and review his day-to-day management  5.  Preventive care needed:  . Microalbumin  6.  Follow-up: 3 weeks    Patient Instructions  Please call to let us know what brand of test strips you need for your meter and start checking blood sugar as follows:  Check blood sugars on waking up at  least 4-5 days a week  Also check blood sugars about 2 hours after meals and sometimes  Recommended blood sugar levels on waking up are 90-130 and about 2 hours after meal is 130-160  Please bring your blood sugar monitor to each visit, thank you  INSULIN: This will be taken twice a day as follows  Start with 12 units of the NovoLog mix insulin to be taken just before eating supper daily  Subsequently if the morning sugar is still over 150 increase the dose by 2 units every 5 days  Also start taking 8 units of insulin on waking up daily and continue the same dose until your next visit  Take Crestor 1/2 tab 3x per week      Consultation note has been sent to the referring physician  Reather Littler 12/10/2018, 1:12 PM   Note: This office note was prepared with Dragon voice recognition system technology. Any  transcriptional errors that result from this process are unintentional.  Counseling time on subjects discussed in assessment and plan sections is over 50% of today's 45 minute visit.

## 2018-12-10 ENCOUNTER — Telehealth: Payer: Self-pay | Admitting: Endocrinology

## 2018-12-10 ENCOUNTER — Other Ambulatory Visit: Payer: Self-pay

## 2018-12-10 ENCOUNTER — Ambulatory Visit (INDEPENDENT_AMBULATORY_CARE_PROVIDER_SITE_OTHER): Payer: Medicare HMO | Admitting: Endocrinology

## 2018-12-10 ENCOUNTER — Encounter: Payer: Self-pay | Admitting: Endocrinology

## 2018-12-10 VITALS — BP 120/72 | HR 56 | Ht 71.0 in | Wt 362.2 lb

## 2018-12-10 DIAGNOSIS — E1142 Type 2 diabetes mellitus with diabetic polyneuropathy: Secondary | ICD-10-CM | POA: Diagnosis not present

## 2018-12-10 DIAGNOSIS — E1165 Type 2 diabetes mellitus with hyperglycemia: Secondary | ICD-10-CM | POA: Diagnosis not present

## 2018-12-10 LAB — POCT GLYCOSYLATED HEMOGLOBIN (HGB A1C): Hemoglobin A1C: 9.6 % — AB (ref 4.0–5.6)

## 2018-12-10 LAB — GLUCOSE, POCT (MANUAL RESULT ENTRY): POC Glucose: 241 mg/dl — AB (ref 70–99)

## 2018-12-10 MED ORDER — NOVOLOG MIX 70/30 FLEXPEN (70-30) 100 UNIT/ML ~~LOC~~ SUPN: PEN_INJECTOR | SUBCUTANEOUS | 1 refills | Status: DC

## 2018-12-10 MED ORDER — GLUCOSE BLOOD VI STRP: ORAL_STRIP | 2 refills | Status: DC

## 2018-12-10 NOTE — Telephone Encounter (Signed)
Which insulin-pen sample does the patient have?

## 2018-12-10 NOTE — Patient Instructions (Addendum)
Please call to let us know what brand of test strips you need for your meter and start checking blood sugar as follows:  Check blood sugars on waking up at least 4-5 days a week  Also check blood sugars about 2 hours after meals and sometimes  Recommended blood sugar levels on waking up are 90-130 and about 2 hours after meal is 130-160  Please bring your blood sugar monitor to each visit, thank you  INSULIN: This will be taken twice a day as follows  Start with 12 units of the NovoLog mix insulin to be taken just before eating supper daily  Subsequently if the morning sugar is still over 150 increase the dose by 2 units every 5 days  Also start taking 8 units of insulin on waking up daily and continue the same dose until your next visit  Take Crestor 1/2 tab 3x per week

## 2018-12-10 NOTE — Telephone Encounter (Signed)
How many times per day does he need to be testing.

## 2018-12-10 NOTE — Telephone Encounter (Signed)
Do you want to change to Humalog?

## 2018-12-10 NOTE — Telephone Encounter (Signed)
Insurance stated that Novolog is the covered medication that is preferred, but the copay for the pt is $141.00, and this is too expensive for them because of them being on a fixed income. Pt's wife stated that the pt does still have the pen that Dr. Mellody Drown gave him. Pt's wife stated that she would know something in the next couple of days about the tier exception. Pt's wife was also encouraged to call this office for anything she should need. She verbalized understanding of this.

## 2018-12-10 NOTE — Telephone Encounter (Signed)
Twice daily.

## 2018-12-10 NOTE — Telephone Encounter (Signed)
Patients wife called to inform that the patient uses a One Touch Verio Meter  MEDICATION: One Touch Verio Test Strips Also, states they believe Flexpens?  PHARMACY:  [facility], [place] - [address]  IS THIS A 90 DAY SUPPLY :   IS PATIENT OUT OF MEDICATION:   IF NOT; HOW MUCH IS LEFT:   LAST APPOINTMENT DATE: @9 /15/2020  NEXT APPOINTMENT DATE:@10 /08/2018  DO WE HAVE YOUR PERMISSION TO LEAVE A DETAILED MESSAGE:  OTHER COMMENTS:    **Let patient know to contact pharmacy at the end of the day to make sure medication is ready. **  ** Please notify patient to allow 48-72 hours to process**  **Encourage patient to contact the pharmacy for refills or they can request refills through [facility]**

## 2018-12-10 NOTE — Telephone Encounter (Signed)
We will need to know what product is best covered

## 2018-12-10 NOTE — Telephone Encounter (Signed)
Rx has been sent  

## 2018-12-10 NOTE — Telephone Encounter (Signed)
Patient's wife Judeen Hammans called re: the RX for Novolog Flex Pen. The Novolog Flexpen is too expensive. Judeen Hammans is working with [employer] to make the medication more affordable. Judeen Hammans wanted to let Dr. Dwyane Dee know that Holland Falling will be contacting Dr. Dwyane Dee for an affordable solution for the Novolog Flexpen (e.g. Holland Falling is going to try to change the tier level to make it more affordable).

## 2018-12-11 ENCOUNTER — Telehealth: Payer: Self-pay | Admitting: Endocrinology

## 2018-12-11 NOTE — Telephone Encounter (Signed)
Patient's wife Judeen Hammans called back with the following information: patient's PCP gave patient [employer]. Also, request for below RX:  MEDICATION: Novofine + 32g pen needles  PHARMACY:   [facility], [place] - [address] [phone] (Phone) [phone] (Fax)    IS THIS A 90 DAY SUPPLY : ?  IS PATIENT OUT OF MEDICATION: No  IF NOT; HOW MUCH IS LEFT: 3 needles  LAST APPOINTMENT DATE: @9 /15/2020  NEXT APPOINTMENT DATE:@10 /08/2018  DO WE HAVE YOUR PERMISSION TO LEAVE A DETAILED MESSAGE: Yes  OTHER COMMENTS:    **Let patient know to contact pharmacy at the end of the day to make sure medication is ready. **  ** Please notify patient to allow 48-72 hours to process**  **Encourage patient to contact the pharmacy for refills or they can request refills through [facility]**

## 2018-12-11 NOTE — Telephone Encounter (Signed)
Called pt and left voicemail requesting a callback to find out what type of insulin he was given at his PCP office.

## 2018-12-12 ENCOUNTER — Other Ambulatory Visit: Payer: Self-pay

## 2018-12-12 ENCOUNTER — Telehealth: Payer: Self-pay

## 2018-12-12 DIAGNOSIS — M545 Low back pain, unspecified: Secondary | ICD-10-CM

## 2018-12-12 DIAGNOSIS — G8929 Other chronic pain: Secondary | ICD-10-CM

## 2018-12-12 MED ORDER — NOVOFINE PLUS 32G X 4 MM MISC: 2 refills | Status: DC

## 2018-12-12 MED ORDER — TRAMADOL HCL 50 MG PO TABS: 50.0000 mg | ORAL_TABLET | Freq: Two times a day (BID) | ORAL | 0 refills | Status: DC | PRN

## 2018-12-12 NOTE — Telephone Encounter (Signed)
Rx for pen needles has been sent.   Dr. Dwyane Dee, patients PCP gave them a sample of Fiasp Flextouch.

## 2018-12-12 NOTE — Telephone Encounter (Signed)
This is not the desirable insulin.  If he is not able to get coverage for NovoLog mix insulin he will have to get 70/30 Novolin insulin from [facility] and inject with a syringe.  May need to be instructed on this

## 2018-12-12 NOTE — Telephone Encounter (Signed)
Called pt's wife and informed her of MD message below. Pt's wife stated that she already purchased the Novolog 70/30. Pt was advised of MD message regarding Novolin 70/30 from [place], and she stated she would speak with the pt about drawing up insulin from a vial.

## 2018-12-12 NOTE — Telephone Encounter (Signed)
Future refills with PCP or orthopedics.

## 2018-12-12 NOTE — Telephone Encounter (Signed)
Patient called and requested Ultram refill.  30 tablets with no refills given.  Future prescriptions with PCP or orthopedic physicians if appropriate.

## 2018-12-12 NOTE — Telephone Encounter (Signed)
Pt called and asked if you could refill his Tramadol?

## 2018-12-17 ENCOUNTER — Other Ambulatory Visit: Payer: Self-pay

## 2018-12-17 ENCOUNTER — Encounter: Payer: Medicare HMO | Attending: Endocrinology | Admitting: Nutrition

## 2018-12-17 DIAGNOSIS — E1149 Type 2 diabetes mellitus with other diabetic neurological complication: Secondary | ICD-10-CM | POA: Insufficient documentation

## 2018-12-17 DIAGNOSIS — E1165 Type 2 diabetes mellitus with hyperglycemia: Secondary | ICD-10-CM | POA: Insufficient documentation

## 2018-12-17 DIAGNOSIS — IMO0002 Reserved for concepts with insufficient information to code with codable children: Secondary | ICD-10-CM

## 2018-12-19 NOTE — Patient Instructions (Signed)
Call with name of meter. Adjust PM dose of insulin by 2u every 4-5 days until morning blood sugar is below 150 Start walking and work up to 30 min. 5X/wk Continue to stop cookies, and ice cream at night

## 2018-12-19 NOTE — Progress Notes (Signed)
Patient is here with his wife, and she brought his blood sugar readings. He did not bring the meter, and they do not know the name of the meter.  Suggested they call us for this. He is taking Novolog mix 10 min. Before breakfast and supper. 8u/12u.   His FBS are all over 180, and he has not adjusted his insulin dose.   We discussed how this insulin work, and the need to adjust the pm dose of insulin to bring down the FBS.  He agreed to go up to 14u.    Discussed the idea of a balanced meal, and to watch the portion sizes of each food group.  They both reported good understanding of this.   Discussed what carbs were, and that they are not just sugar.  They are also fruits, starches and milk products.   Typical day: 9AM UP.   2 cups oof coffee with skim milk 12-1PM: ham sandwich, with tomatos and skim milk.  8PM:  6 ounces protein, with 3-5 servings of starch, and 2 non starchy veg.  Water to drink 10PM: used to eat oatmeal cookies and milk, but has stopped this.  Also used to eat icecream. Discussed the idea that we may need to reduce the amount of bread at supper, when eating a large baked potato, and he agreed to do th. He denies eating between meals now, but was doing this when he first saw Dr Dwyane Dee.

## 2018-12-27 ENCOUNTER — Telehealth: Payer: Self-pay

## 2018-12-27 DIAGNOSIS — G8929 Other chronic pain: Secondary | ICD-10-CM

## 2018-12-27 DIAGNOSIS — M545 Low back pain, unspecified: Secondary | ICD-10-CM

## 2018-12-27 NOTE — Telephone Encounter (Signed)
Pt called and wants to know if you can send in Tramadol 50mg  for him

## 2018-12-30 ENCOUNTER — Other Ambulatory Visit: Payer: Self-pay

## 2018-12-30 MED ORDER — TRAMADOL HCL 50 MG PO TABS: 50.0000 mg | ORAL_TABLET | Freq: Two times a day (BID) | ORAL | 0 refills | Status: DC | PRN

## 2018-12-30 NOTE — Progress Notes (Signed)
Patient ID: William Cameron, male   DOB: 09/24/1950, 68 y.o.   MRN: [id number]           Reason for Appointment: for Type 2 Diabetes  Referring PCP: Ralene Ok   History of Present Illness:          Date of diagnosis of type 2 diabetes mellitus:   Around year 2000      Background history:  Patient has very poor recall about his initial diagnosis and previous treatment Only last office visit from PCP available and no other records available to review diabetes management and previous A1c results He does not know if he took metformin However in 2016 his A1c was 6% and he thinks that his blood sugars started going up only when he had staph infection in his foot in 7/19 when his A1c was 7.6  He had been taking [place] on admission  He apparently stopped [place] a few months ago because of high out-of-pocket expense  Previously had tried Trulicity but apparently had nausea with this and not clear when this was stopped   Recent history:   Most recent A1c is 9.6 done on 12/10/2018, previously 8.5 done in 09/2018     INSULIN regimen: NovoLog mix before breakfast and dinner: 10--16 units  Non-insulin hypoglycemic drugs the patient is taking are: None  Current management, blood sugar patterns and problems identified:  Because of his blood sugars mostly over 200 he was started on premixed insulin twice daily  Although he is concerned about the cost of this he is currently taking the injection which is being drawn up by his wife  He has no difficulty with the injections and is trying to take it before breakfast and dinner  He had a discussion with diabetes educator also and is trying to eat a small breakfast and a snack like peanut butter crackers at lunch instead of just 1 meal in the evening  He still will occasionally eat a lot of carbohydrates at dinnertime  Weight has not gone up with starting insulin so far  He has been using the One Touch very monitor but did not bring it for  download  Continue monitoring blood sugar daily before and these are variable  However his morning readings are mostly high and likely averaging about 155        Side effects from medications have been: Nausea from Trulicity                   Exercise:  Unable to do any  Glucose monitoring:      PRE-MEAL Fasting Lunch Dinner Bedtime Overall  Glucose range:  139-168   115-200    Mean/median:     ?    Dietician visit, most recent: Never CDE visit: 9/20  Weight history:  Wt Readings from Last 3 Encounters:  12/31/18 (!) 361 lb 12.8 oz (164.1 kg)  12/10/18 (!) 362 lb 3.2 oz (164.3 kg)  08/28/18 (!) 355 lb (161 kg)    Glycemic control:   Lab Results  Component Value Date   HGBA1C 9.6 (A) 12/10/2018   HGBA1C 7.6 (H) 10/08/2017   HGBA1C 6.0 10/05/2014   Lab Results  Component Value Date   LDLCALC 107 (H) 10/10/2017   CREATININE 1.44 (H) 11/27/2017   No results found for: MICRALBCREAT  No results found for: FRUCTOSAMINE  No visits with results within 1 Week(s) from this visit.  Latest known visit with results is:  Office Visit on 12/10/2018  Component Date Value Ref Range Status  . Hemoglobin A1C 12/10/2018 9.6* 4.0 - 5.6 % Final  . POC Glucose 12/10/2018 241* 70 - 99 mg/dl Final    Allergies as of 12/31/2018      Reactions   Penicillins Anaphylaxis   Has patient had a PCN reaction causing immediate rash, facial/tongue/throat swelling, SOB or lightheadedness with hypotension: Yes Has patient had a PCN reaction causing severe rash involving mucus membranes or skin necrosis: Yes Has patient had a PCN reaction that required hospitalization Yes Has patient had a PCN reaction occurring within the last 10 years: No If all of the above answers are "NO", then may proceed with Cephalosporin use.   Tetanus Toxoids Anaphylaxis, Shortness Of Breath, Swelling   Sulfa Antibiotics Nausea And Vomiting, Swelling   Trulicity [dulaglutide]    nausea      Medication List        Accurate as of December 31, 2018 12:43 PM. If you have any questions, ask your nurse or doctor.        amLODipine 5 MG tablet Commonly known as: NORVASC Take 5 mg by mouth daily.   b complex vitamins tablet Take 1 tablet by mouth daily. Take 1 tablet by mouth once daily.   benazepril 40 MG tablet Commonly known as: LOTENSIN Take 40 mg by mouth daily after supper.   Biotin 2500 MCG Caps Take 2,500 mcg by mouth daily. Take 1 tablet by mouth once daily.   Eliquis 5 MG Tabs tablet Generic drug: apixaban Take 5 mg by mouth 2 (two) times daily.   fenofibrate micronized 130 MG capsule Commonly known as: ANTARA Take 130 mg by mouth daily. Take 1 tablet by mouth once daily.   furosemide 20 MG tablet Commonly known as: LASIX Take 1 tablet (20 mg total) by mouth daily.   glucose blood test strip Use Onetouch verio test strips as instructed to check blood sugar twice daily.   metoprolol succinate 25 MG 24 hr tablet Commonly known as: TOPROL-XL Take 1 tablet (25 mg total) by mouth daily. Take with or immediately following a meal.   NovoFine Plus 32G X 4 MM Misc Generic drug: Insulin Pen Needle Use to inject insulin twice daily.   NovoLOG Mix 70/30 FlexPen (70-30) 100 UNIT/ML FlexPen Generic drug: insulin aspart protamine - aspart 8 units before breakfast and 12 units before dinner, increase as directed What changed: additional instructions   rosuvastatin 10 MG tablet Commonly known as: CRESTOR Take 1 tablet (10 mg total) by mouth daily.   traMADol 50 MG tablet Commonly known as: Ultram Take 1 tablet (50 mg total) by mouth every 12 (twelve) hours as needed.   vitamin B-12 500 MCG tablet Commonly known as: CYANOCOBALAMIN Take 2,500 mcg by mouth daily. Take 2560mcg by mouth once daily.   Vitamin D 50 MCG (2000 UT) tablet Take 2,000 Units by mouth daily.       Allergies:  Allergies  Allergen Reactions  . Penicillins Anaphylaxis    Has patient had a PCN reaction  causing immediate rash, facial/tongue/throat swelling, SOB or lightheadedness with hypotension: Yes Has patient had a PCN reaction causing severe rash involving mucus membranes or skin necrosis: Yes Has patient had a PCN reaction that required hospitalization Yes Has patient had a PCN reaction occurring within the last 10 years: No If all of the above answers are "NO", then may proceed with Cephalosporin use.  . Tetanus Toxoids Anaphylaxis, Shortness Of Breath and Swelling  . Sulfa Antibiotics Nausea  And Vomiting and Swelling  . Trulicity [Dulaglutide]     nausea    Past Medical History:  Diagnosis Date  . Atrial fibrillation (HCC)   . Bronchitis    chronic bronchitis  . COPD (chronic obstructive pulmonary disease) (HCC)   . Diabetes mellitus without complication (HCC)   . Elevated lactic acid level   . Hypertension   . PONV (postoperative nausea and vomiting)    post op nausea  . 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   [employer]. Shakes   . Sleep apnea    cpap at night    Past Surgical History:  Procedure Laterality Date  . CARDIOVERSION N/A 01/25/2016   Procedure: CARDIOVERSION;  Surgeon: Yates DecampJay Ganji, MD;  [facility];  Service: Cardiovascular;  Laterality: N/A;  . COLONOSCOPY  02/02/2012   Procedure: COLONOSCOPY;  Surgeon: Theda BelfastPatrick D Hung, MD;  Location: [facility] ENDOSCOPY;  Service: Endoscopy;  Laterality: N/A;  . HERNIA REPAIR    . HYDROCELE EXCISION / REPAIR    . I&D EXTREMITY Left 11/24/2017   Procedure: IRRIGATION AND DEBRIDEMENT LEFT FOOT, REMOVAL OF TWO SCREWS, INSERTION OF ANTIBIOTIC BEADS;  Surgeon: Felecia ShellingEvans, Brent M, DPM;  Location: [facility] OR;  Service: Podiatry;  Laterality: Left;  . JOINT REPLACEMENT     rt knee  . ROTATOR CUFF REPAIR  rt  . TOTAL HIP ARTHROPLASTY Right 07/14/2014   Procedure: RIGHT TOTAL HIP ARTHROPLASTY;  Surgeon: Durene RomansMatthew Olin, MD;   Location: [facility] ORS;  Service: Orthopedics;  Laterality: Right;    Family History  Problem Relation Age of Onset  . Diabetes type II Mother   . Prostate cancer Father [id number]    Social History:  reports that he has never smoked. He has never used smokeless tobacco. He reports that he does not drink alcohol or use drugs.   Review of Systems   Lipid history: He had muscle and joint pains with Lipitor and also with Crestor prescribed by PCP LDL 136 done in 09/2018 with triglycerides 151    Lab Results  Component Value Date   CHOL 166 10/10/2017   HDL 41 10/10/2017   LDLCALC 107 (H) 10/10/2017   TRIG 90 10/10/2017   CHOLHDL 4.0 10/10/2017           Hypertension: Has been on treatment, treatment includes benazepril 40 mg  BP Readings from Last 3 Encounters:  12/31/18 126/72  12/10/18 120/72  08/28/18 (!) 153/83   Has had renal dysfunction last GFR 46 done by PCP  Lab Results  Component Value Date   CREATININE 1.44 (H) 11/27/2017   CREATININE 1.41 (H) 11/26/2017   CREATININE 1.54 (H) 11/25/2017    Most recent eye exam was in 2019  Most recent foot exam: 9/20  Currently known complications of diabetes: Mild neuropathy  Refuses influenza vaccine  LABS:  No visits with results within 1 Week(s) from this visit.  Latest known visit with results is:  Office Visit on 12/10/2018  Component Date Value Ref Range Status  . Hemoglobin A1C 12/10/2018 9.6* 4.0 - 5.6 % Final  . POC Glucose 12/10/2018 241* 70 - 99 mg/dl Final    Physical Examination:  BP 126/72 (BP Location: Left Arm, Patient Position: Sitting, Cuff Size: Large)   Pulse (!) 51   Ht 5\' 11"  (1.803 m)   Wt (!) 361 lb 12.8 oz (164.1 kg)   SpO2 96%  BMI 50.46 kg/m          ASSESSMENT:  Diabetes type 2 with obesity  See history of present illness for detailed discussion of current diabetes management, blood sugar patterns and problems identified  With starting premixed insulin his blood sugars are somewhat  better  Currently only requiring relatively low doses of insulin and on about 26 units total per day Blood sugars are being checked before breakfast and dinner and appear to be averaging about 150-160 both morning and evening Most of his readings fasting are relatively high However not clear if he has high readings after dinner or not Diet can be improved with moderation of carbohydrate and fat and balanced meals   He is unable to exercise because of his CHF and obesity Also unable to lose weight  Hypertension: Well-controlled  Hyperlipidemia: Last LDL was not at target  PLAN:    1. Glucose monitoring: . Patient was given instructions to check readings after meals more consistently and may not be necessarily do readings before breakfast or dinner daily To bring monitor on next visit  2.  Diabetes education: . Patient will need nutrition counseling  3.  Lifestyle changes: . Dietary changes: Low-fat low carbohydrate meals  4.  Medication regimen: . Since he is on insulin alone and can benefit from metformin will check to see if his renal function is improving . Discussed that even with low doses of metformin ER he may be able to improve his fasting readings and may be worth trying this even though he thinks he may have had diarrhea from this before . We will increase his evening 70/30 insulin up to 18 units at least and discussed adjusting this based on bedtime and morning readings . Given patient assistance information for getting the NovoLog mix insulin  5.  Preventive care needed:  . Microalbumin to be checked today along with lipids  6.  Follow-up: 6 weeks    Patient Instructions  Check blood sugars on waking up 3 days a week  Also check blood sugars about 2 hours after meals and do this after different meals by rotation  Recommended blood sugar levels on waking up are 90-130 and about 2 hours after meal is 130-180  Please bring your blood sugar monitor to each  visit, thank you  INSULIN: 18 UNITS PM AND 10 IN AM  IF sugar at bedtime stays > 180 go to 20 units before supper    Counseling time on subjects discussed in assessment and plan sections is over 50% of today's 25 minute visit   Reather Littler 12/31/2018, 12:43 PM   Note: This office note was prepared with Dragon voice recognition system technology. Any transcriptional errors that result from this process are unintentional.  Counseling time on subjects discussed in assessment and plan sections is over 50% of today's 45 minute visit.

## 2018-12-30 NOTE — Telephone Encounter (Signed)
I will, but he should get his Rx from PCP or orthopedics going forwards as I cannot continue to monitor non cardiac issues, hope he understands.

## 2018-12-31 ENCOUNTER — Ambulatory Visit (INDEPENDENT_AMBULATORY_CARE_PROVIDER_SITE_OTHER): Payer: Medicare HMO | Admitting: Endocrinology

## 2018-12-31 ENCOUNTER — Encounter: Payer: Self-pay | Admitting: Endocrinology

## 2018-12-31 VITALS — BP 126/72 | HR 51 | Ht 71.0 in | Wt 361.8 lb

## 2018-12-31 DIAGNOSIS — E1165 Type 2 diabetes mellitus with hyperglycemia: Secondary | ICD-10-CM | POA: Diagnosis not present

## 2018-12-31 DIAGNOSIS — Z794 Long term (current) use of insulin: Secondary | ICD-10-CM | POA: Diagnosis not present

## 2018-12-31 LAB — LIPID PANEL
Cholesterol: 197 mg/dL (ref 0–200)
HDL: 41.5 mg/dL (ref >39.00)
LDL Cholesterol: 132 mg/dL — ABNORMAL HIGH (ref 0–99)
NonHDL: 155.94
Total CHOL/HDL Ratio: 5
Triglycerides: 121 mg/dL (ref 0.0–149.0)
VLDL: 24.2 mg/dL (ref 0.0–40.0)

## 2018-12-31 LAB — URINALYSIS, ROUTINE W REFLEX MICROSCOPIC
Bilirubin Urine: NEGATIVE
Hgb urine dipstick: NEGATIVE
Ketones, ur: NEGATIVE
Leukocytes,Ua: NEGATIVE
Nitrite: NEGATIVE
RBC / HPF: NONE SEEN (ref 0–?)
Specific Gravity, Urine: <=1.005 — AB (ref 1.000–1.030)
Total Protein, Urine: NEGATIVE
Urine Glucose: NEGATIVE
Urobilinogen, UA: 0.2 (ref 0.0–1.0)
WBC, UA: NONE SEEN (ref 0–?)
pH: 6 (ref 5.0–8.0)

## 2018-12-31 LAB — MICROALBUMIN / CREATININE URINE RATIO
Creatinine,U: 28.3 mg/dL
Microalb Creat Ratio: 2.5 mg/g (ref 0.0–30.0)
Microalb, Ur: <0.7 mg/dL (ref 0.0–1.9)

## 2018-12-31 LAB — COMPREHENSIVE METABOLIC PANEL
ALT: 48 U/L (ref 0–53)
AST: 45 U/L — ABNORMAL HIGH (ref 0–37)
Albumin: 4.5 g/dL (ref 3.5–5.2)
Alkaline Phosphatase: 53 U/L (ref 39–117)
BUN: 21 mg/dL (ref 6–23)
CO2: 26 mEq/L (ref 19–32)
Calcium: 10.8 mg/dL — ABNORMAL HIGH (ref 8.4–10.5)
Chloride: 99 mEq/L (ref 96–112)
Creatinine, Ser: 1.45 mg/dL (ref 0.40–1.50)
GFR: 48.3 mL/min — ABNORMAL LOW (ref >60.00)
Glucose, Bld: 175 mg/dL — ABNORMAL HIGH (ref 70–99)
Potassium: 4.8 mEq/L (ref 3.5–5.1)
Sodium: 133 mEq/L — ABNORMAL LOW (ref 135–145)
Total Bilirubin: 0.6 mg/dL (ref 0.2–1.2)
Total Protein: 8.2 g/dL (ref 6.0–8.3)

## 2018-12-31 NOTE — Addendum Note (Signed)
Addended by: Kaylyn Lim I on: 12/31/2018 03:18 PM   Modules accepted: Orders

## 2018-12-31 NOTE — Patient Instructions (Addendum)
Check blood sugars on waking up 3 days a week  Also check blood sugars about 2 hours after meals and do this after different meals by rotation  Recommended blood sugar levels on waking up are 90-130 and about 2 hours after meal is 130-180  Please bring your blood sugar monitor to each visit, thank you  INSULIN: 18 UNITS PM AND 10 IN AM  IF sugar at bedtime stays > 180 go to 20 units before supper

## 2019-01-01 ENCOUNTER — Other Ambulatory Visit: Payer: Self-pay

## 2019-01-01 MED ORDER — METFORMIN HCL ER 500 MG PO TB24: ORAL_TABLET | ORAL | 2 refills | Status: DC

## 2019-02-26 ENCOUNTER — Encounter: Payer: Self-pay | Admitting: Endocrinology

## 2019-02-26 ENCOUNTER — Other Ambulatory Visit: Payer: Self-pay

## 2019-02-26 ENCOUNTER — Ambulatory Visit (INDEPENDENT_AMBULATORY_CARE_PROVIDER_SITE_OTHER): Payer: Medicare HMO | Admitting: Endocrinology

## 2019-02-26 VITALS — BP 140/64 | HR 54 | Ht 71.0 in | Wt 365.4 lb

## 2019-02-26 DIAGNOSIS — E1165 Type 2 diabetes mellitus with hyperglycemia: Secondary | ICD-10-CM | POA: Diagnosis not present

## 2019-02-26 DIAGNOSIS — Z794 Long term (current) use of insulin: Secondary | ICD-10-CM

## 2019-02-26 LAB — POCT GLYCOSYLATED HEMOGLOBIN (HGB A1C): Hemoglobin A1C: 8 % — AB (ref 4.0–5.6)

## 2019-02-26 NOTE — Patient Instructions (Addendum)
Milk no more than 6 oz daily  Insulin 12 units am and 22 before supper   Check blood sugars on waking up 5  days a week  Also check blood sugars about 2 hours after meals and do this after different meals by rotation  Recommended blood sugar levels on waking up are 90-130 and about 2 hours after meal is 130-180  Please bring your blood sugar monitor to each visit, thank you  Walk daily

## 2019-02-26 NOTE — Progress Notes (Signed)
Patient ID: Shirlean MylarMichael E Jaffee, male   DOB: 27-Sep-1950, 68 y.o.   MRN: [id number]           Reason for Appointment: for Type 2 Diabetes  Referring PCP: Ralene Okoy Moreira   History of Present Illness:          Date of diagnosis of type 2 diabetes mellitus:   Around year 2000      Background history:  Patient has very poor recall about his initial diagnosis and previous treatment Only last office visit from PCP available and no other records available to review diabetes management and previous A1c results He does not know if he took metformin However in 2016 his A1c was 6% and he thinks that his blood sugars started going up only when he had staph infection in his foot in 7/19 when his A1c was 7.6  He had been taking [place] on admission  He apparently stopped [place] a few months ago because of high out-of-pocket expense  Previously had tried Trulicity but apparently had nausea with this and not clear when this was stopped   Recent history:       INSULIN regimen: NovoLog mix before breakfast and dinner: 10--18 units  Non-insulin hypoglycemic drugs the patient is taking are: None  His A1c is 8%, previously 9.6 in September  Current management, blood sugar patterns and problems identified:  His blood sugars appear to be higher than on his last visit about 8 weeks ago  He was started on premixed insulin in September  He was tried on Metformin on the last visit because of persistently high fasting readings and to improve insulin sensitivity  However after contacting his wife appears that he stopped taking it because of abdominal cramps  In the last few days he has not checked his sugars in the afternoons or evening  Also not checking blood sugars after meals anyway  He thinks he is taking his evening insulin before supper consistently  Previously a few readings before dinnertime were relatively better  His diet is poor and he is drinking a lot of milk, last night had a brownie  with his dinner for dessert  Has not had a consultation with the dietitian        Side effects from medications have been: Nausea from Trulicity                   Exercise:  Unable to do any  Glucose monitoring:      PRE-MEAL Fasting Lunch Dinner Bedtime Overall  Glucose range:  135-218   144-254    Mean/median:      191   Previous readings by recall:  PRE-MEAL Fasting Lunch Dinner Bedtime Overall  Glucose range:  139-168   115-200    Mean/median:     ?    Dietician visit, most recent: Never CDE visit: 9/20  Weight history:  Wt Readings from Last 3 Encounters:  02/26/19 (!) 365 lb 6.4 oz (165.7 kg)  12/31/18 (!) 361 lb 12.8 oz (164.1 kg)  12/10/18 (!) 362 lb 3.2 oz (164.3 kg)    Glycemic control:   Lab Results  Component Value Date   HGBA1C 8.0 (A) 02/26/2019   HGBA1C 9.6 (A) 12/10/2018   HGBA1C 7.6 (H) 10/08/2017   Lab Results  Component Value Date   MICROALBUR <0.7 12/31/2018   LDLCALC 132 (H) 12/31/2018   CREATININE 1.45 12/31/2018   Lab Results  Component Value Date   MICRALBCREAT 2.5 12/31/2018  No results found for: FRUCTOSAMINE  Office Visit on 02/26/2019  Component Date Value Ref Range Status  . Hemoglobin A1C 02/26/2019 8.0* 4.0 - 5.6 % Final    Allergies as of 02/26/2019      Reactions   Penicillins Anaphylaxis   Has patient had a PCN reaction causing immediate rash, facial/tongue/throat swelling, SOB or lightheadedness with hypotension: Yes Has patient had a PCN reaction causing severe rash involving mucus membranes or skin necrosis: Yes Has patient had a PCN reaction that required hospitalization Yes Has patient had a PCN reaction occurring within the last 10 years: No If all of the above answers are "NO", then may proceed with Cephalosporin use.   Tetanus Toxoids Anaphylaxis, Shortness Of Breath, Swelling   Sulfa Antibiotics Nausea And Vomiting, Swelling   Trulicity [dulaglutide]    nausea      Medication List       Accurate  as of February 26, 2019 12:31 PM. If you have any questions, ask your nurse or doctor.        STOP taking these medications   furosemide 20 MG tablet Commonly known as: LASIX Stopped by: Reather Littler, MD   metFORMIN 500 MG 24 hr tablet Commonly known as: GLUCOPHAGE-XR Stopped by: Reather Littler, MD   metoprolol succinate 25 MG 24 hr tablet Commonly known as: TOPROL-XL Stopped by: Reather Littler, MD   rosuvastatin 10 MG tablet Commonly known as: CRESTOR Stopped by: Reather Littler, MD     TAKE these medications   amLODipine 5 MG tablet Commonly known as: NORVASC Take 5 mg by mouth daily.   b complex vitamins tablet Take 1 tablet by mouth daily. Take 1 tablet by mouth once daily.   benazepril 40 MG tablet Commonly known as: LOTENSIN Take 40 mg by mouth daily after supper.   Biotin 2500 MCG Caps Take 2,500 mcg by mouth daily. Take 1 tablet by mouth once daily.   Eliquis 5 MG Tabs tablet Generic drug: apixaban Take 5 mg by mouth 2 (two) times daily.   fenofibrate micronized 130 MG capsule Commonly known as: ANTARA Take 130 mg by mouth daily. Take 1 tablet by mouth once daily.   glucose blood test strip Use Onetouch verio test strips as instructed to check blood sugar twice daily.   insulin aspart protamine- aspart (70-30) 100 UNIT/ML injection Commonly known as: NOVOLOG MIX 70/30 Inject into the skin. Inject 10 units under the skin at breakfast and 18 units at dinner. What changed: Another medication with the same name was removed. Continue taking this medication, and follow the directions you see here. Changed by: Reather Littler, MD   NovoFine Plus 32G X 4 MM Misc Generic drug: Insulin Pen Needle Use to inject insulin twice daily.   traMADol 50 MG tablet Commonly known as: Ultram Take 1 tablet (50 mg total) by mouth every 12 (twelve) hours as needed.   vitamin B-12 500 MCG tablet Commonly known as: CYANOCOBALAMIN Take 2,500 mcg by mouth daily. Take by mouth once  daily.   Vitamin D 50 MCG (2000 UT) tablet Take 2,000 Units by mouth daily.       Allergies:  Allergies  Allergen Reactions  . Penicillins Anaphylaxis    Has patient had a PCN reaction causing immediate rash, facial/tongue/throat swelling, SOB or lightheadedness with hypotension: Yes Has patient had a PCN reaction causing severe rash involving mucus membranes or skin necrosis: Yes Has patient had a PCN reaction that required hospitalization Yes Has patient had a PCN  reaction occurring within the last 10 years: No If all of the above answers are "NO", then may proceed with Cephalosporin use.  . Tetanus Toxoids Anaphylaxis, Shortness Of Breath and Swelling  . Sulfa Antibiotics Nausea And Vomiting and Swelling  . Trulicity [Dulaglutide]     nausea    Past Medical History:  Diagnosis Date  . Atrial fibrillation ([place])   . Bronchitis    chronic bronchitis  . COPD (chronic obstructive pulmonary disease) ([place])   . Diabetes mellitus without complication ([place])   . Elevated lactic acid level   . Hypertension   . PONV (postoperative nausea and vomiting)    post op nausea  . 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   [name]   . Sleep apnea    cpap at night    Past Surgical History:  Procedure Laterality Date  . CARDIOVERSION N/A 01/25/2016   Procedure: CARDIOVERSION;  Surgeon: Adrian Prows, MD;  Location: [facility] ENDOSCOPY;  Service: Cardiovascular;  Laterality: N/A;  . COLONOSCOPY  02/02/2012   Procedure: COLONOSCOPY;  Surgeon: Beryle Beams, MD;  Location: [facility] ENDOSCOPY;  Service: Endoscopy;  Laterality: N/A;  . HERNIA REPAIR    . HYDROCELE EXCISION / REPAIR    . I&D EXTREMITY Left 11/24/2017   Procedure: IRRIGATION AND DEBRIDEMENT LEFT FOOT, REMOVAL OF TWO SCREWS, INSERTION OF ANTIBIOTIC BEADS;  Surgeon: Edrick Kins, DPM;  Location: [place];  Service: Podiatry;   Laterality: Left;  . JOINT REPLACEMENT     rt knee  . ROTATOR CUFF REPAIR  rt  . TOTAL HIP ARTHROPLASTY Right 07/14/2014   Procedure: RIGHT TOTAL HIP ARTHROPLASTY;  Surgeon: Paralee Cancel, MD;  Location: [facility] ORS;  Service: Orthopedics;  Laterality: Right;    Family History  Problem Relation Age of Onset  . Diabetes type II Mother   . Prostate cancer Father 79    Social History:  reports that he has never smoked. He has never used smokeless tobacco. He reports that he does not drink alcohol or use drugs.   Review of Systems   Lipid history: He had muscle and joint pains with Lipitor and also with Crestor prescribed by PCP which he could not tolerate apparently  LDL 136 done in 09/2018 with triglycerides 151    Lab Results  Component Value Date   CHOL 197 12/31/2018   HDL 41.50 12/31/2018   LDLCALC 132 (H) 12/31/2018   TRIG 121.0 12/31/2018   CHOLHDL 5 12/31/2018           Hypertension: Has been on treatment, treatment includes benazepril 40 mg  BP Readings from Last 3 Encounters:  02/26/19 140/64  12/31/18 126/72  12/10/18 120/72   Has had renal dysfunction last GFR 46  Lab Results  Component Value Date   CREATININE 1.45 12/31/2018   CREATININE 1.44 (H) 11/27/2017   CREATININE 1.41 (H) 11/26/2017    Most recent eye exam was in 2019  Most recent foot exam: 9/20  Currently known complications of diabetes: Mild neuropathy  Refuses influenza vaccine again today  LABS:  Office Visit on 02/26/2019  Component Date Value Ref Range Status  . Hemoglobin A1C 02/26/2019 8.0* 4.0 - 5.6 % Final    Physical Examination:  BP 140/64 (BP Location: Left Arm, Patient Position: Sitting, Cuff Size: Large)   Pulse (!) 54   Ht 5\' 11"  (1.803 m)   Wt Marland Kitchen)  365 lb 6.4 oz (165.7 kg)   SpO2 96%   BMI 50.96 kg/m          ASSESSMENT:  Diabetes type 2 with obesity  See history of present illness for detailed discussion of current diabetes management, blood sugar patterns and  problems identified  His A1c is 8%  Currently only requiring relatively low doses of insulin and on about 26 units total per day Blood sugars are being checked before breakfast and dinner and appear to be averaging about 150-160 both morning and evening Most of his readings fasting are relatively high However not clear if he has high readings after dinner or not Diet can be improved with moderation of carbohydrate and fat and balanced meals   He is unable to exercise because of his CHF and obesity Also unable to lose weight     Hyperlipidemia: Last LDL was not at target  PLAN:    1. Glucose monitoring: Reminded him to check readings after meals instead of mostly in the morning Discussed blood sugar targets before and after meals  2.  Diabetes education: . Patient will need nutrition counseling and consultation will be ordered  3.  Lifestyle changes: . Dietary changes: Needs to cut back on sweets, drinking large volumes of milk and high-fat foods . He needs to start walking and encouraged him to start walking outside in his neighborhood  4.  Medication regimen: Since he is intolerant to Metformin and cannot afford brand-name medications we will continue insulin alone He will increase his evening dose by 4 units However will need to make sure his blood sugars after dinner are not getting below target Written instructions given  6.  Follow-up: 6 weeks    Patient Instructions  Milk no more than 6 oz daily  Insulin 12 units am and 22 before supper   Check blood sugars on waking up 5  days a week  Also check blood sugars about 2 hours after meals and do this after different meals by rotation  Recommended blood sugar levels on waking up are 90-130 and about 2 hours after meal is 130-180  Please bring your blood sugar monitor to each visit, thank you  Walk daily           Reather Littler 02/26/2019, 12:31 PM   Note: This office note was prepared with Dragon voice  recognition system technology. Any transcriptional errors that result from this process are unintentional.  Counseling time on subjects discussed in assessment and plan sections is over 50% of today's 45 minute visit.

## 2019-03-10 ENCOUNTER — Other Ambulatory Visit: Payer: Self-pay

## 2019-03-10 ENCOUNTER — Ambulatory Visit (INDEPENDENT_AMBULATORY_CARE_PROVIDER_SITE_OTHER): Payer: Medicare HMO | Admitting: Cardiology

## 2019-03-10 ENCOUNTER — Encounter: Payer: Self-pay | Admitting: Cardiology

## 2019-03-10 VITALS — BP 143/81 | HR 47 | Ht 72.0 in | Wt 362.0 lb

## 2019-03-10 DIAGNOSIS — M545 Low back pain, unspecified: Secondary | ICD-10-CM

## 2019-03-10 DIAGNOSIS — G8929 Other chronic pain: Secondary | ICD-10-CM

## 2019-03-10 DIAGNOSIS — I5032 Chronic diastolic (congestive) heart failure: Secondary | ICD-10-CM

## 2019-03-10 DIAGNOSIS — I25118 Atherosclerotic heart disease of native coronary artery with other forms of angina pectoris: Secondary | ICD-10-CM | POA: Diagnosis not present

## 2019-03-10 DIAGNOSIS — R0609 Other forms of dyspnea: Secondary | ICD-10-CM | POA: Diagnosis not present

## 2019-03-10 DIAGNOSIS — I4821 Permanent atrial fibrillation: Secondary | ICD-10-CM

## 2019-03-10 DIAGNOSIS — R0600 Dyspnea, unspecified: Secondary | ICD-10-CM

## 2019-03-10 DIAGNOSIS — Z789 Other specified health status: Secondary | ICD-10-CM

## 2019-03-10 DIAGNOSIS — E782 Mixed hyperlipidemia: Secondary | ICD-10-CM

## 2019-03-10 MED ORDER — EVOLOCUMAB 140 MG/ML ~~LOC~~ SOSY: 140.0000 mg | PREFILLED_SYRINGE | Freq: Once | SUBCUTANEOUS | Status: DC

## 2019-03-10 MED ORDER — REPATHA 140 MG/ML ~~LOC~~ SOSY: 1.0000 | PREFILLED_SYRINGE | SUBCUTANEOUS | 6 refills | Status: DC

## 2019-03-10 MED ORDER — TRAMADOL HCL 50 MG PO TABS: 50.0000 mg | ORAL_TABLET | Freq: Two times a day (BID) | ORAL | 3 refills | Status: DC | PRN

## 2019-03-10 NOTE — Progress Notes (Signed)
Primary Physician/Referring:  Jilda Panda, MD  Patient ID: William Cameron, male    DOB: 1950-10-21, 68 y.o.   MRN: [id number]  Chief Complaint  Patient presents with  . Congestive Heart Failure  . Coronary Artery Disease    HPI: William Cameron  is a 68 y.o. male  with permanent atril fibrillation, hyperlipidemia with intolerance to statins, uncontrolled diabetes mellitus with stage 3 CKD, hypertension, morbid obesity with obstructive sleep apnea does not use CPAP regularly follows Dr. Brett Fairy, and severe 3 vessel CAD by coronary angiogram in Nov 2017 and recommended medical therapy due to diffuse disease.    Patient was seen by me on 07/22/2018 for acute exacerbation of diastolic heart failure and worsening dyspnea.  He was started on Ranexa and furosemide was continued.  I have set him up for a 1 month office visit and follow-up. Patient has discontinued Ranexa stating that he did not help him.  On his last visit a month ago, he had mentioned that dyspnea and improved and he has not had any further recurrence of chest pain.  He also discontinued insulin due to cost. Is tolerating amlodipine started for hypertension and also angina pectoris.  He has gained 7 pounds in weight which he had lost a month ago. States that he is back to baseline with regard to dyspnea with no recurrence of chest pain, accompanied by his wife. He denies  palpitations, PND, orthopnea, edema, dizziness, syncope, or symptoms suggestive of claudication or TIA.   Past Medical History:  Diagnosis Date  . Atrial fibrillation ([place])   . Bronchitis    chronic bronchitis  . COPD (chronic obstructive pulmonary disease) ([place])   . Diabetes mellitus without complication ([place])   . Elevated lactic acid level   . Hypertension   . PONV (postoperative nausea and vomiting)    post op nausea  . Severe obesity (BMI >= 40) (HCC) 09/03/2013   patien tlost 55 pounds, from 450 pounds, arthritism neuropathy, diabetes , high risk for  sleep apnea. DOT driver.   William Cameron   . Sleep apnea    cpap at night    Past Surgical History:  Procedure Laterality Date  . CARDIOVERSION N/A 01/25/2016   Procedure: CARDIOVERSION;  Surgeon: Adrian Prows, MD;  Location: [facility];  Service: Cardiovascular;  Laterality: N/A;  . COLONOSCOPY  02/02/2012   Procedure: COLONOSCOPY;  Surgeon: Beryle Beams, MD;  Location: [facility] ENDOSCOPY;  Service: Endoscopy;  Laterality: N/A;  . HERNIA REPAIR    . HYDROCELE EXCISION / REPAIR    . I&D EXTREMITY Left 11/24/2017   Procedure: IRRIGATION AND DEBRIDEMENT LEFT FOOT, REMOVAL OF TWO SCREWS, INSERTION OF ANTIBIOTIC BEADS;  Surgeon: Edrick Kins, DPM;  Location: [facility];  Service: Podiatry;  Laterality: Left;  . JOINT REPLACEMENT     rt knee  . ROTATOR CUFF REPAIR  rt  . TOTAL HIP ARTHROPLASTY Right 07/14/2014   Procedure: RIGHT TOTAL HIP ARTHROPLASTY;  Surgeon: Paralee Cancel, MD;  Location: [facility];  Service: Orthopedics;  Laterality: Right;    Social History   Socioeconomic History  . Marital status: Married    Spouse name: William Cameron  . Number of children: 1  . Years of education: 12+  . Highest education level: Not on file  Occupational History    Employer: [employer]  Tobacco Use  . Smoking status: Never Smoker  . Smokeless tobacco: Never Used  Substance and Sexual Activity  . Alcohol use: No  . Drug use:  No  . Sexual activity: Never  Other Topics Concern  . Not on file  Social History Narrative   Patient is married William Cameron) and lives at home with his wife.   Patient has one adult child.   Patient is working full-time.   Patient has a high school and auto classes.   Patient is right-handed.   Patient drinks two cups of coffee every morning and very little tea.   Social Determinants of Health   Financial Resource Strain:   . Difficulty of Paying Living Expenses: Not on file  Food Insecurity:   . Worried About Charity fundraiser in the Last Year: Not on file  . Ran Out of Food in  the Last Year: Not on file  Transportation Needs:   . Lack of Transportation (Medical): Not on file  . Lack of Transportation (Non-Medical): Not on file  Physical Activity:   . Days of Exercise per Week: Not on file  . Minutes of Exercise per Session: Not on file  Stress:   . Feeling of Stress : Not on file  Social Connections:   . Frequency of Communication with Friends and Family: Not on file  . Frequency of Social Gatherings with Friends and Family: Not on file  . Attends Religious Services: Not on file  . Active Member of Clubs or Organizations: Not on file  . Attends Archivist Meetings: Not on file  . Marital Status: Not on file  Intimate Partner Violence:   . Fear of Current or Ex-Partner: Not on file  . Emotionally Abused: Not on file  . Physically Abused: Not on file  . Sexually Abused: Not on file    Current Outpatient Medications on File Prior to Visit  Medication Sig Dispense Refill  . amLODipine (NORVASC) 5 MG tablet Take 5 mg by mouth daily.    Marland Kitchen apixaban (ELIQUIS) 5 MG TABS tablet Take 5 mg by mouth 2 (two) times daily.    Marland Kitchen b complex vitamins tablet Take 1 tablet by mouth daily. Take 1 tablet by mouth once daily.    . benazepril (LOTENSIN) 40 MG tablet Take 40 mg by mouth daily after supper.     . Biotin 2500 MCG CAPS Take 2,500 mcg by mouth daily. Take 1 tablet by mouth once daily.    . Cholecalciferol (VITAMIN D) 2000 units tablet Take 2,000 Units by mouth daily.     . fenofibrate micronized (ANTARA) 130 MG capsule Take 130 mg by mouth daily. Take 1 tablet by mouth once daily.    Marland Kitchen glucose blood test strip Use Onetouch verio test strips as instructed to check blood sugar twice daily. 100 each 2  . insulin aspart protamine- aspart (NOVOLOG MIX 70/30) (70-30) 100 UNIT/ML injection Inject into the skin. Inject 10 units under the skin at breakfast and 18 units at dinner.    . Insulin Pen Needle (NOVOFINE PLUS) 32G X 4 MM MISC Use to inject insulin twice  daily. 100 each 2  . vitamin B-12 (CYANOCOBALAMIN) 500 MCG tablet Take 2,500 mcg by mouth daily. Take 2536mg by mouth once daily.     No current facility-administered medications on file prior to visit.    Review of Systems  Constitution: Positive for malaise/fatigue. Negative for chills, decreased appetite, weight gain and weight loss (6-8lbs ).  Cardiovascular: Positive for dyspnea on exertion and leg swelling (improved on lasix). Negative for syncope.  Respiratory: Positive for sleep disturbances due to breathing (has OSA but not on  CPAP). Negative for cough and sputum production.   Endocrine: Negative for cold intolerance.  Hematologic/Lymphatic: Does not bruise/bleed easily.  Skin: Positive for color change (from leg infection).  Musculoskeletal: Positive for arthritis (diffuse), back pain and joint pain. Negative for joint swelling.  Gastrointestinal: Negative for abdominal pain, anorexia and change in bowel habit.  Neurological: Negative for headaches and light-headedness.  Psychiatric/Behavioral: Negative for depression and substance abuse.  All other systems reviewed and are negative.     Objective  Blood pressure (!) 143/81, pulse (!) 47, height 6' (1.829 m), weight (!) 362 lb (164.2 kg), SpO2 97 %. Body mass index is 49.1 kg/m.   Vitals with BMI 03/10/2019 02/26/2019 12/31/2018  Height 6' 0" 5' 11" 5' 11"  Weight 362 lbs 365 lbs 6 oz 361 lbs 13 oz  BMI 49.09 70.26 37.85  Systolic 885 027 741  Diastolic 81 64 72  Pulse 47 54 51     Physical Exam  Constitutional: He appears well-developed. No distress.  Morbidly obese  HENT:  Head: Atraumatic.  Eyes: Conjunctivae are normal.  Neck: No thyromegaly present.  Short neck and difficult to evaluate JVP  Cardiovascular: Normal rate, regular rhythm and normal heart sounds. Exam reveals no gallop.  No murmur heard. Pulses:      Carotid pulses are 2+ on the right side and 2+ on the left side.      Dorsalis pedis pulses are  2+ on the right side and 2+ on the left side.       Posterior tibial pulses are 2+ on the right side and 2+ on the left side.  Femoral and popliteal pulse difficult to feel due to patient's body habitus.  No edema.   Pulmonary/Chest: Effort normal and breath sounds normal.  Abdominal: Soft. Bowel sounds are normal.  Obese. Pannus present  Musculoskeletal:        General: Normal range of motion.     Cervical back: Neck supple.  Neurological: He is alert.  Skin: Skin is warm and dry.  Psychiatric: He has a normal mood and affect.   Radiology: No results found.  Laboratory examination:   Labs 04/19/2018: Total cholesterol 219, triglycerides 175, HDL 42, LDL 142.  HB 16.2/HCT 50.1, platelets 167.  Potassium 4.8, BUN 21, creatinine 1.33, eGFR 55 mL.  A1c 8.0%.  CMP Latest Ref Rng & Units 12/31/2018 11/27/2017 11/26/2017  Glucose 70 - 99 mg/dL 175(H) 142(H) 137(H)  BUN 6 - 23 mg/dL _0 Creatinine 0.40 - 1.50 mg/dL 1.45 1.44(H) 1.41(H)  Sodium 135 - 145 mEq/L 133(L) 138 137  Potassium 3.5 - 5.1 mEq/L 4.8 4.1 4.3  Chloride 96 - 112 mEq/L 99 104 107  CO2 19 - 32 mEq/L _1 Calcium 8.4 - 10.5 mg/dL 10.8(H) 9.5 9.0  Total Protein 6.0 - 8.3 g/dL 8.2 6.6 6.3(L)  Total Bilirubin 0.2 - 1.2 mg/dL 0.6 0.9 0.6  Alkaline Phos 39 - 117 U/L 53 56 48  AST 0 - 37 U/L 45(H) 18 17  ALT 0 - 53 U/L 48 12 13   CBC Latest Ref Rng & Units 11/27/2017 11/26/2017 11/25/2017  WBC 4.0 - 10.5 K/uL 10.1 7.4 9.5  Hemoglobin 13.0 - 17.0 g/dL 13.5 12.4(L) 12.4(L)  Hematocrit 39.0 - 52.0 % 43.6 39.9 39.5  Platelets 150 - 400 K/uL 169 165 199   Lipid Panel     Component Value Date/Time   CHOL 197 12/31/2018 1041   TRIG 121.0 12/31/2018 1041  HDL 41.50 12/31/2018 1041   CHOLHDL 5 12/31/2018 1041   VLDL 24.2 12/31/2018 1041   LDLCALC 132 (H) 12/31/2018 1041   HEMOGLOBIN A1C Lab Results  Component Value Date   HGBA1C 8.0 (A) 02/26/2019   MPG 171.42 10/08/2017   TSH No results for input(s): TSH in the  last 8760 hours.  Cardiac Studies:   Direct current cardioversion 01/25/2016: 120 x1, 150x1 to NSR.  Echocardiogram  05/13/2018:  Left ventricle cavity is normal in size. Normal global wall motion. Unable to evaluate diastolic function due to A. Fibrillation. Calculated EF 59%. Left atrial cavity is severely dilated at 5.4 cm. Right ventricle cavity is mild to moderately dilated. Mildly reduced right ventricular function. A moderateor band is noted at the RV apex (normal variant). Trace mitral regurgitation. Mild calcification of the mitral valve annulus. Mild mitral valve leaflet thickening. Trace tricuspid regurgitation. Unable to estimate PA pressure due to absence/minimal TR signal. IVC is dilated with poor inspiration collapse consistent with elevated right atrial pressure. Compared to the study done on 01/04/2016, no significant change.  Exercise sestamibi stress test 12/27/2015: 1. Two day protocol followed. The resting electrocardiogram demonstrated normal sinus rhythm, incomplete RBBB and no resting arrhythmias.  Low voltage and  poor R progression. The stress electrocardiogram was normal. Patient exercised on Bruce protocol for 4:13 minutes and achieved  4.84 METS. Stress test terminated due to % MPHR achieved (Target HR >85%). Symptoms included dyspnea. 2. The perfusion imaging study demonstrates diaphragmatic attenuation artifact in the inferior wall. There is no demonstrable ischemia or scar. LV systolic function was normal at 64%. This is a low risk study. 01/24/2016: Impression: EKG 11/13/2017: Atrial fibrillation with controlled ventricular response at the rate of 66 bpm, leftward axis, poor R-wave progression, cannot exclude anterolateral infarct old. Low-voltage complexes. Nonspecific T abnormality. No significant change from EKG 11/15/2016.  Coronary angiogram 01/24/2016: Mid LAD occluded, D2 small to moderate sized proximal 80%. Mid Cx occluded with moderate OM1 which  bifurcates. Both calcific CTO. RCA PL 1 has 80% stenosis (small) and PL3 occluded, mod-large vessel. LVEF 50-55% with apical akinesis.   Assessment     ICD-10-CM   1. Chronic diastolic (congestive) heart failure (HCC)  I50.32   2. Dyspnea on exertion  R06.00   3. Coronary artery disease of native artery of native heart with stable angina pectoris ([place])  I25.118 EKG 12-Lead  4. Permanent atrial fibrillation (HCC) CHA2DS2-VASc Score is 4. Yearly risk of stroke: 4.  I48.21   5. Mixed hyperlipidemia  E78.2 Lipid Panel With LDL/HDL Ratio    DISCONTINUED: Evolocumab SOSY 140 mg  6. Statin intolerance  Z78.9   7. Chronic low back pain, unspecified back pain laterality, unspecified whether sciatica present  M54.5 traMADol (ULTRAM) 50 MG tablet   G89.29      EKG 04/09/2018: Sinus arrest with junctional escape rhythm at the rate of 54 bpm, left axis deviation, left anterior fascicular block.  Incomplete right bundle branch block.  Anterolateral infarct old.  Low-voltage complexes.  Nonspecific T abnormality. Compared to 05/08/2018, atrial fibrillation with went to the rate of 50 bpm now replaced by junctional escape rhythm  Recommendations:   William Cameron  is a 68 y.o.  male  with permanent atril fibrillation, hyperlipidemia with intolerance to statins, uncontrolled diabetes mellitus with stage 3 CKD, hypertension, morbid obesity with obstructive sleep apnea does not use CPAP regularly follows Dr. Brett Fairy, and severe 3 vessel CAD by coronary angiogram in Nov 2017 and recommended medical therapy  due to diffuse disease.    Patient admits to increased shortness of breath with even light activity, most likely multifactorial given patient's BMI, deconditioning and chronic diastolic heart failure. Echocardiogram remains unchanged without change in LVEF.  Unfortunately patient disagrees with his wife was present that she keeps cooking large amounts of meals.  With furosemide, leg edema has essentially  resolved. At present continue medical therapy, if symptoms do not improve, will consider proceeding with right and left heart catheterization to reevaluate his coronary anatomy.  His blood pressure was elevated today, but patient states that he has had very low blood pressure at home and has had occasional episodes of dizziness associated with this.  Hence I did not make any changes. OV in 6 months.  Adrian Prows, MD, [facility] 03/10/2019, 11:20 AM [facility]. [place] Pager: [phone] Office: [phone] If no answer Cell [phone]

## 2019-03-18 ENCOUNTER — Encounter: Payer: Medicare HMO | Attending: Endocrinology | Admitting: Dietician

## 2019-03-18 ENCOUNTER — Other Ambulatory Visit: Payer: Self-pay

## 2019-03-18 ENCOUNTER — Encounter: Payer: Self-pay | Admitting: Dietician

## 2019-03-18 DIAGNOSIS — IMO0002 Reserved for concepts with insufficient information to code with codable children: Secondary | ICD-10-CM

## 2019-03-18 DIAGNOSIS — E1165 Type 2 diabetes mellitus with hyperglycemia: Secondary | ICD-10-CM | POA: Diagnosis present

## 2019-03-18 DIAGNOSIS — E1149 Type 2 diabetes mellitus with other diabetic neurological complication: Secondary | ICD-10-CM | POA: Insufficient documentation

## 2019-03-18 NOTE — Progress Notes (Signed)
Diabetes Self-Management Education  Visit Type: First/Initial  Appt. Start Time: 0900 Appt. End Time: 1000  03/18/2019  [name], identified by name and date of birth, is a 68 y.o. male with a diagnosis of Diabetes: Type 2.   ASSESSMENT Patient is here today alone.  He is hard of hearing but his wife was unable to attend as she is not feeling well (negative covid test last week).  His wife filled out the paperwork for this appointment and manages his medications.  He reports that he does not forget to take his insulin and checks his BG 2 times per day.  History includes Type 2 Diabetes and HLD.  He started insulin 11/2018. He uses a [employer], jewellery.  BG readings reviewed. Labs noted to include:  A1C 8% 02/26/2019 decreased from 9.6% 12/10/2018, GFR 48, LDL 132 12/31/2018 Medications include Novolog 70/30 12 units q am and 22 units q HS.  6-8 years ago he had a hip replacement and lost from 400 lbs to 300 lbs to qualify for this.   Weight 03/18/2019:  362 lbs  Patient lives with his wife. His son also lives with them and does HVAC and works long hours.  His wife does not serve supper until his son comes home between 8 pm and 10 pm. His wife has not been feeling well lately and it takes her longer to prepare meals.  He has a zero [employer]er, jewellery and mulches and is active in the spring and summer but is not active now.   He is a retired Freight forwarder.   Height 5\' 11"  (1.803 m), weight (!) 362 lb (164.2 kg). Body mass index is 50.49 kg/m.  Diabetes Self-Management Education - 03/18/19 0917      Visit Information   Visit Type  First/Initial      Initial Visit   Diabetes Type  Type 2    Are you currently following a meal plan?  No    Are you taking your medications as prescribed?  Yes      Health Coping   How would you rate your overall health?  Fair      Psychosocial Assessment   Patient Belief/Attitude about Diabetes  Defeat/Burnout    Self-care  barriers  Hard of hearing    Self-management support  Doctor's office    Other persons present  Patient    Patient Concerns  Nutrition/Meal planning;Weight Control;Glycemic Control    Special Needs  None    Preferred Learning Style  No preference indicated    Learning Readiness  Contemplating    How often do you need to have someone help you when you read instructions, pamphlets, or other written materials from your doctor or pharmacy?  5 - Always    What is the last grade level you completed in school?  12th grade      Pre-Education Assessment   Patient understands the diabetes disease and treatment process.  Needs Review    Patient understands incorporating nutritional management into lifestyle.  Needs Review    Patient undertands incorporating physical activity into lifestyle.  Needs Review    Patient understands using medications safely.  Needs Review    Patient understands monitoring blood glucose, interpreting and using results  Needs Review    Patient understands prevention, detection, and treatment of acute complications.  Needs Review    Patient understands prevention, detection, and treatment of chronic complications.  Needs Review    Patient understands how  to develop strategies to address psychosocial issues.  Needs Review    Patient understands how to develop strategies to promote health/change behavior.  Needs Review      Complications   Last HgB A1C per patient/outside source  8 %   02/2019 decreased from 9.6 12/10/2018   How often do you check your blood sugar?  1-2 times/day    Fasting Blood glucose range (mg/dL)  70-129;130-179;180-200;>200    Postprandial Blood glucose range (mg/dL)  180-200;>200    Number of hypoglycemic episodes per month  0    Have you had a dilated eye exam in the past 12 months?  No    Have you had a dental exam in the past 12 months?  No    Are you checking your feet?  Yes    How many days per week are you checking your feet?  1      Dietary  Intake   Breakfast  1 egg, 2 stips bacon, 2 slices white toast with butter and jelly, 2% (12 oz) milk, coffee with small amount of creamer OR skips   6 am- 10 am   Snack (morning)  none    Lunch  skips    Snack (afternoon)  none    Dinner  spaghetti and salad, 2 (12 oz) glasses 2% milk, 3 truffles OR hamburger with tator tots with "a lot of salt" or Pizza OR steak, baked potato with ketchup, salad   8 pm - 10 pm   Snack (evening)  none    Beverage(s)  4 (12 oz) glasses of 2% milk, coffee with a small amount of cream, water, rare regular soda      Exercise   Exercise Type  ADL's   no exercise since it is cold and with covid   How many days per week to you exercise?  0    How many minutes per day do you exercise?  0    Total minutes per week of exercise  0      Patient Education   Previous Diabetes Education  Yes (please comment)   11/2018 with Vaughan Basta, RN, CDE   Nutrition management   Role of diet in the treatment of diabetes and the relationship between the three main macronutrients and blood glucose level;Meal options for control of blood glucose level and chronic complications.;Meal timing in regards to the patients' current diabetes medication.    Physical activity and exercise   Role of exercise on diabetes management, blood pressure control and cardiac health.;Helped patient identify appropriate exercises in relation to his/her diabetes, diabetes complications and other health issue.    Medications  Reviewed patients medication for diabetes, action, purpose, timing of dose and side effects.;Taught/reviewed insulin injection, site rotation, insulin storage and needle disposal.    Monitoring  Purpose and frequency of SMBG.;Identified appropriate SMBG and/or A1C goals.    Chronic complications  Relationship between chronic complications and blood glucose control    Personal strategies to promote health  Lifestyle issues that need to be addressed for better diabetes care      Individualized  Goals (developed by patient)   Nutrition  General guidelines for healthy choices and portions discussed    Physical Activity  Exercise 3-5 times per week;30 minutes per day    Medications  take my medication as prescribed    Monitoring   test my blood glucose as discussed    Reducing Risk  increase portions of healthy fats    Health Coping  discuss diabetes with (comment)   MD, RD, CDE     Post-Education Assessment   Patient understands the diabetes disease and treatment process.  Needs Review    Patient understands incorporating nutritional management into lifestyle.  Needs Review    Patient undertands incorporating physical activity into lifestyle.  Needs Review    Patient understands using medications safely.  Demonstrates understanding / competency    Patient understands monitoring blood glucose, interpreting and using results  Demonstrates understanding / competency    Patient understands prevention, detection, and treatment of acute complications.  Needs Review    Patient understands prevention, detection, and treatment of chronic complications.  Needs Review    Patient understands how to develop strategies to address psychosocial issues.  Needs Review    Patient understands how to develop strategies to promote health/change behavior.  Needs Review      Outcomes   Expected Outcomes  Demonstrated interest in learning. Expect positive outcomes    Future DMSE  PRN    Program Status  Completed       Individualized Plan for Diabetes Self-Management Training:   Learning Objective:  Patient will have a greater understanding of diabetes self-management. Patient education plan is to attend individual and/or group sessions per assessed needs and concerns.   Plan:   Patient Instructions  Plan:  Aim for 3 Carb Choices per meal (45 grams) +/- 1 either way  Aim for 0-1 Carbs per snack if hungry  Include protein in moderation with your meals and snacks Consider reading food labels for  Total Carbohydrate of foods Consider  increasing your activity level by walking for 30 minutes daily as tolerated Continue checking BG at alternate times per day  Continue taking medication as directed by MD  What can you do to increase motivation? Each small changes add up. Consider having dinner earlier.      Expected Outcomes:  Demonstrated interest in learning. Expect positive outcomes  Education material provided: ADA - How to Thrive: A Guide for Your Journey with Diabetes, Meal plan card and Snack sheet  If problems or questions, patient to contact team via:  Phone and Email  Future DSME appointment: PRN

## 2019-03-18 NOTE — Patient Instructions (Signed)
Plan:  Aim for 3 Carb Choices per meal (45 grams) +/- 1 either way  Aim for 0-1 Carbs per snack if hungry  Include protein in moderation with your meals and snacks Consider reading food labels for Total Carbohydrate of foods Consider  increasing your activity level by walking for 30 minutes daily as tolerated Continue checking BG at alternate times per day  Continue taking medication as directed by MD  What can you do to increase motivation? Each small changes add up. Consider having dinner earlier.

## 2019-03-23 ENCOUNTER — Other Ambulatory Visit: Payer: Self-pay | Admitting: Endocrinology

## 2019-03-25 LAB — LIPID PANEL WITH LDL/HDL RATIO
Cholesterol, Total: 145 mg/dL (ref 100–199)
HDL: 39 mg/dL — ABNORMAL LOW (ref >39)
LDL Chol Calc (NIH): 85 mg/dL (ref 0–99)
LDL/HDL Ratio: 2.2 ratio (ref 0.0–3.6)
Triglycerides: 118 mg/dL (ref 0–149)
VLDL Cholesterol Cal: 21 mg/dL (ref 5–40)

## 2019-03-25 NOTE — Progress Notes (Signed)
Patient aware of LIPID panel results.

## 2019-04-03 ENCOUNTER — Other Ambulatory Visit: Payer: Self-pay

## 2019-04-07 ENCOUNTER — Ambulatory Visit: Payer: Medicare HMO | Admitting: Endocrinology

## 2019-04-09 ENCOUNTER — Other Ambulatory Visit: Payer: Self-pay

## 2019-04-09 ENCOUNTER — Telehealth: Payer: Self-pay | Admitting: Endocrinology

## 2019-04-09 MED ORDER — INSULIN LISPRO PROT & LISPRO (75-25 MIX) 100 UNIT/ML KWIKPEN: PEN_INJECTOR | SUBCUTANEOUS | 0 refills | Status: DC

## 2019-04-09 NOTE — Telephone Encounter (Signed)
He can start Humalog mix insulin 12 units before breakfast and 22 before supper He was supposed to follow-up this month for his diabetes and needs to make an appointment, labs ahead of time if able to

## 2019-04-09 NOTE — Telephone Encounter (Signed)
Called pt and gave MD message to pt's wife. She verbalized understanding of this.  She also stated that she would not be scheduling a follow up visit right now due to the pt not wanting to leave pt's wife after diagnosis of cancer.

## 2019-04-09 NOTE — Telephone Encounter (Signed)
Please advise. Last office visit note mentions that pt is taking Novolog Mix. Can pt use same units prescribed for Humalog 75/25 as he has been using for Novolog 70/30?

## 2019-04-09 NOTE — Telephone Encounter (Signed)
Attempted to contact pt and give him MD message. Pt did not answer and voicemail box was full. Will attempt to call again later.

## 2019-04-09 NOTE — Telephone Encounter (Signed)
Patient called re: Patient has new insurance [facility]) which changed the cost of Novolog to over $300. Patient requests a RX for a substitute: Humalog (insurance told patient they cover Humalog). Patient has on hand Humalog mix 75/25 free sample and requests to be called at ph# [phone] re: if patient can take that. Patient's wife was recently diagnosed with liver cancer and patient has been taking care of her and unable to make appointment at this time (lives in [place]). Patient requests the following RX be sent asap:  MEDICATION: Humalog  PHARMACY:   [facility] [address], [place] - [address] [place] [address] Phone:  [phone]  Fax:  [phone]      IS THIS A 90 DAY SUPPLY : No  IS PATIENT OUT OF MEDICATION: Yes  IF NOT; HOW MUCH IS LEFT: 0  LAST APPOINTMENT DATE: @12 /27/2020  NEXT APPOINTMENT DATE:@Visit  date not found  DO WE HAVE YOUR PERMISSION TO LEAVE A DETAILED MESSAGE: Yes  OTHER COMMENTS:    **Let patient know to contact pharmacy at the end of the day to make sure medication is ready. **  ** Please notify patient to allow 48-72 hours to process**  **Encourage patient to contact the pharmacy for refills or they can request refills through [facility]**

## 2019-04-16 ENCOUNTER — Other Ambulatory Visit: Payer: Self-pay | Admitting: Endocrinology

## 2019-04-21 ENCOUNTER — Encounter: Payer: Self-pay | Admitting: Cardiology

## 2019-04-21 ENCOUNTER — Ambulatory Visit: Payer: Medicare HMO | Admitting: Cardiology

## 2019-04-21 ENCOUNTER — Other Ambulatory Visit: Payer: Self-pay

## 2019-04-21 VITALS — BP 140/77 | HR 63 | Ht 72.0 in | Wt 347.0 lb

## 2019-04-21 DIAGNOSIS — I5032 Chronic diastolic (congestive) heart failure: Secondary | ICD-10-CM

## 2019-04-21 DIAGNOSIS — I4821 Permanent atrial fibrillation: Secondary | ICD-10-CM

## 2019-04-21 DIAGNOSIS — I25118 Atherosclerotic heart disease of native coronary artery with other forms of angina pectoris: Secondary | ICD-10-CM

## 2019-04-21 DIAGNOSIS — I10 Essential (primary) hypertension: Secondary | ICD-10-CM

## 2019-04-21 DIAGNOSIS — E782 Mixed hyperlipidemia: Secondary | ICD-10-CM

## 2019-04-21 NOTE — Progress Notes (Signed)
Primary Physician/Referring:  Jilda Panda, MD  Patient ID: William Cameron, male    DOB: 02/18/1951, 69 y.o.   MRN: [id number]  Chief Complaint  Patient presents with  . Hyperlipidemia  . Hypertension  . Congestive Heart Failure    HPI: William Cameron  is a 69 y.o. male  with permanent atril fibrillation, hyperlipidemia with intolerance to statins, uncontrolled diabetes mellitus with stage 3 CKD, hypertension, morbid obesity with obstructive sleep apnea does not use CPAP regularly follows Dr. Brett Fairy, and severe 3 vessel CAD by coronary angiogram in Nov 2017 and recommended medical therapy due to diffuse disease.    He presents here for her 30-monthoffice visit and follow-up of hyperlipidemia and hypertension.  No further episodes of decompensated heart failure.  He is presently on Repatha which he is tolerating.  Underwent labs.  He is under tremendous stress due to his wife being diagnosed with metastatic liver disease.  Past Medical History:  Diagnosis Date  . Atrial fibrillation ([facility]   . Bronchitis    chronic bronchitis  . COPD [place] obstructive pulmonary disease) ([place]   . Diabetes mellitus without complication ([place]   . Elevated lactic acid level   . Hyperlipidemia   . Hypertension   . PONV (postoperative nausea and vomiting)    post op nausea  . 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   .[name]  . Sleep apnea    cpap at night    Past Surgical History:  Procedure Laterality Date  . CARDIOVERSION N/A 01/25/2016   Procedure: CARDIOVERSION;  Surgeon: JAdrian Prows MD;  [facility];  Service: Cardiovascular;  Laterality: N/A;  . COLONOSCOPY  02/02/2012   Procedure: COLONOSCOPY;  Surgeon: PBeryle Beams MD;  Location: [facility] ENDOSCOPY;  Service: Endoscopy;  Laterality: N/A;  . HERNIA REPAIR    . HYDROCELE EXCISION / REPAIR    . I & D EXTREMITY  Left 11/24/2017   Procedure: IRRIGATION AND DEBRIDEMENT LEFT FOOT, REMOVAL OF TWO SCREWS, INSERTION OF ANTIBIOTIC BEADS;  Surgeon: EEdrick Kins DPM;  [place] [facility]  Service: Podiatry;  Laterality: Left;  . JOINT REPLACEMENT     rt knee  . ROTATOR CUFF REPAIR  rt  . TOTAL HIP ARTHROPLASTY Right 07/14/2014   Procedure: RIGHT TOTAL HIP ARTHROPLASTY;  Surgeon: MParalee Cancel MD;  Location: [facility] ORS;  Service: Orthopedics;  Laterality: Right;    Social History   Socioeconomic History  . Marital status: Married    Spouse name: SJudeen Hammans . Number of children: 1  . Years of education: 12+  . Highest education level: Not on file  Occupational History    Employer: [employer]  Tobacco Use  . Smoking status: Never Smoker  . Smokeless tobacco: Never Used  Substance and Sexual Activity  . Alcohol use: No  . Drug use: No  . Sexual activity: Never  Other Topics Concern  . Not on file  Social History Narrative   Patient is married (Judeen Hammans and lives at home with his wife.   Patient has one adult child.   Patient is working full-time.   Patient has a high school and auto classes.   Patient is right-handed.   Patient drinks two cups of coffee every morning and very little tea.   Social Determinants of Health   Financial Resource Strain:   . Difficulty of Paying  Living Expenses: Not on file  Food Insecurity:   . Worried About Charity fundraiser in the Last Year: Not on file  . Ran Out of Food in the Last Year: Not on file  Transportation Needs:   . Lack of Transportation (Medical): Not on file  . Lack of Transportation (Non-Medical): Not on file  Physical Activity:   . Days of Exercise per Week: Not on file  . Minutes of Exercise per Session: Not on file  Stress:   . Feeling of Stress : Not on file  Social Connections:   . Frequency of Communication with Friends and Family: Not on file  . Frequency of Social Gatherings with Friends and Family: Not on file  . Attends Religious  Services: Not on file  . Active Member of Clubs or Organizations: Not on file  . Attends Archivist Meetings: Not on file  . Marital Status: Not on file  Intimate Partner Violence:   . Fear of Current or Ex-Partner: Not on file  . Emotionally Abused: Not on file  . Physically Abused: Not on file  . Sexually Abused: Not on file    Current Outpatient Medications on File Prior to Visit  Medication Sig Dispense Refill  . amLODipine (NORVASC) 5 MG tablet Take 5 mg by mouth daily.    Marland Kitchen apixaban (ELIQUIS) 5 MG TABS tablet Take 5 mg by mouth 2 (two) times daily.    Marland Kitchen b complex vitamins tablet Take 1 tablet by mouth daily. Take 1 tablet by mouth once daily.    . benazepril (LOTENSIN) 40 MG tablet Take 40 mg by mouth daily after supper.     . Biotin 2500 MCG CAPS Take 2,500 mcg by mouth daily. Take 1 tablet by mouth once daily.    . Cholecalciferol (VITAMIN D) 2000 units tablet Take 2,000 Units by mouth daily.     . Evolocumab (REPATHA) 140 MG/ML SOSY Inject 1 Syringe into the skin every 14 (fourteen) days. 2.1 mL 6  . fenofibrate micronized (ANTARA) 130 MG capsule Take 130 mg by mouth daily. Take 1 tablet by mouth once daily.    . Insulin Lispro Prot & Lispro (HUMALOG MIX 75/25 KWIKPEN) (75-25) 100 UNIT/ML Kwikpen Inject 12 units under the skin in the morning and 22 units in the evening. 15 mL 0  . Insulin Pen Needle (NOVOFINE PLUS) 32G X 4 MM MISC Use to inject insulin twice daily. 100 each 2  . ONETOUCH VERIO test strip USE 1 STRIP TO CHECK GLUCOSE TWICE DAILY 100 each 2  . traMADol (ULTRAM) 50 MG tablet Take 1 tablet (50 mg total) by mouth every 12 (twelve) hours as needed. 60 tablet 3  . vitamin B-12 (CYANOCOBALAMIN) 500 MCG tablet Take 2,500 mcg by mouth daily. Take 2577mg by mouth once daily.     No current facility-administered medications on file prior to visit.    Review of Systems  Constitution: Positive for malaise/fatigue. Negative for chills, decreased appetite, weight  gain and weight loss (6-8lbs ).  Cardiovascular: Positive for dyspnea on exertion and leg swelling (improved on lasix). Negative for syncope.  Respiratory: Positive for sleep disturbances due to breathing (has OSA but not on CPAP). Negative for cough and sputum production.   Endocrine: Negative for cold intolerance.  Hematologic/Lymphatic: Does not bruise/bleed easily.  Skin: Positive for color change (from leg infection).  Musculoskeletal: Positive for arthritis (diffuse), back pain and joint pain. Negative for joint swelling.  Gastrointestinal: Negative for abdominal pain, anorexia  and change in bowel habit.  Neurological: Negative for headaches and light-headedness.  Psychiatric/Behavioral: Negative for depression and substance abuse. The patient has insomnia (wife diagnosed with liver cancer) and is nervous/anxious.   All other systems reviewed and are negative.  Objective  Blood pressure 140/77, pulse 63, height 6' (1.829 m), weight (!) 347 lb (157.4 kg), SpO2 97 %. Body mass index is 47.06 kg/m.   Vitals with BMI 04/21/2019 03/18/2019 03/10/2019  Height 6' 0"  5' 11"  6' 0"   Weight 347 lbs 362 lbs 362 lbs  BMI 47.05 38.88 75.79  Systolic 728 - 206  Diastolic 77 - 81  Pulse 63 - 47     Physical Exam  Constitutional: He appears well-developed. No distress.  Morbidly obese  HENT:  Head: Atraumatic.  Neck:  Short neck and difficult to evaluate JVP  Cardiovascular: Normal rate, regular rhythm and normal heart sounds. Exam reveals no gallop.  No murmur heard. Pulses:      Carotid pulses are 2+ on the right side and 2+ on the left side.      Dorsalis pedis pulses are 2+ on the right side and 2+ on the left side.       Posterior tibial pulses are 2+ on the right side and 2+ on the left side.  Femoral and popliteal pulse difficult to feel due to patient's body habitus.  No edema.   Pulmonary/Chest: Effort normal and breath sounds normal.  Abdominal: Soft. Bowel sounds are normal.   Obese. Pannus present   Radiology: No results found.  Laboratory examination:    CMP Latest Ref Rng & Units 12/31/2018 11/27/2017 11/26/2017  Glucose 70 - 99 mg/dL 175(H) 142(H) 137(H)  BUN 6 - 23 mg/dL 21 19 20   Creatinine 0.40 - 1.50 mg/dL 1.45 1.44(H) 1.41(H)  Sodium 135 - 145 mEq/L 133(L) 138 137  Potassium 3.5 - 5.1 mEq/L 4.8 4.1 4.3  Chloride 96 - 112 mEq/L 99 104 107  CO2 19 - 32 mEq/L 26 26 22   Calcium 8.4 - 10.5 mg/dL 10.8(H) 9.5 9.0  Total Protein 6.0 - 8.3 g/dL 8.2 6.6 6.3(L)  Total Bilirubin 0.2 - 1.2 mg/dL 0.6 0.9 0.6  Alkaline Phos 39 - 117 U/L 53 56 48  AST 0 - 37 U/L 45(H) 18 17  ALT 0 - 53 U/L 48 12 13   CBC Latest Ref Rng & Units 11/27/2017 11/26/2017 11/25/2017  WBC 4.0 - 10.5 K/uL 10.1 7.4 9.5  Hemoglobin 13.0 - 17.0 g/dL 13.5 12.4(L) 12.4(L)  Hematocrit 39.0 - 52.0 % 43.6 39.9 39.5  Platelets 150 - 400 K/uL 169 165 199   Lipid Panel     Component Value Date/Time   CHOL 145 03/24/2019 1313   TRIG 118 03/24/2019 1313   HDL 39 (L) 03/24/2019 1313   CHOLHDL 5 12/31/2018 1041   VLDL 24.2 12/31/2018 1041   LDLCALC 85 03/24/2019 1313   HEMOGLOBIN A1C Lab Results  Component Value Date   HGBA1C 8.0 (A) 02/26/2019   MPG 171.42 10/08/2017   TSH No results for input(s): TSH in the last 8760 hours.   Labs 04/19/2018: Total cholesterol 219, triglycerides 175, HDL 42, LDL 142.  HB 16.2/HCT 50.1, platelets 167.  Potassium 4.8, BUN 21, creatinine 1.33, eGFR 55 mL.  A1c 8.0%.  Cardiac Studies:   Direct current cardioversion 01/25/2016: 120 x1, 150x1 to NSR.  Exercise sestamibi stress test 12/27/2015: 1. Two day protocol followed. The resting electrocardiogram demonstrated normal sinus rhythm, incomplete RBBB and no resting arrhythmias.  Low voltage and  poor R progression. The stress electrocardiogram was normal. Patient exercised on Bruce protocol for 4:13 minutes and achieved  4.84 METS. Stress test terminated due to % MPHR achieved (Target HR >85%). Symptoms  included dyspnea. 2. The perfusion imaging study demonstrates diaphragmatic attenuation artifact in the inferior wall. There is no demonstrable ischemia or scar. LV systolic function was normal at 64%. This is a low risk study. 01/24/2016: Impression: EKG 11/13/2017: Atrial fibrillation with controlled ventricular response at the rate of 66 bpm, leftward axis, poor R-wave progression, cannot exclude anterolateral infarct old. Low-voltage complexes. Nonspecific T abnormality. No significant change from EKG 11/15/2016.  Coronary angiogram 01/24/2016: Mid LAD occluded, D2 small to moderate sized proximal 80%. Mid Cx occluded with moderate OM1 which bifurcates. Both calcific CTO. RCA PL 1 has 80% stenosis (small) and PL3 occluded, mod-large vessel. LVEF 50-55% with apical akinesis.   Echocardiogram  05/13/2018:  Left ventricle cavity is normal in size. Normal global wall motion. Unable to evaluate diastolic function due to A. Fibrillation. Calculated EF 59%. Left atrial cavity is severely dilated at 5.4 cm. Right ventricle cavity is mild to moderately dilated. Mildly reduced right ventricular function. A moderateor band is noted at the RV apex (normal variant). Trace mitral regurgitation. Mild calcification of the mitral valve annulus. Mild mitral valve leaflet thickening. Trace tricuspid regurgitation. Unable to estimate PA pressure due to absence/minimal TR signal. IVC is dilated with poor inspiration collapse consistent with elevated right atrial pressure. Compared to the study done on 01/04/2016, no significant change.  Assessment     ICD-10-CM   1. Chronic diastolic (congestive) heart failure (HCC)  I50.32   2. Coronary artery disease of native artery of native heart with stable angina pectoris ([place])  I25.118   3. Permanent atrial fibrillation (HCC) CHA2DS2-VASc Score is 4. Yearly risk of stroke: 4.  I48.21   4. Essential hypertension  I10   5. Mixed hyperlipidemia  E78.2     EKG 04/09/2018:  Sinus arrest with junctional escape rhythm at the rate of 54 bpm, left axis deviation, left anterior fascicular block.  Incomplete right bundle branch block.  Anterolateral infarct old.  Low-voltage complexes.  Nonspecific T abnormality. Compared to 05/08/2018, atrial fibrillation with went to the rate of 50 bpm now replaced by junctional escape rhythm  Recommendations:   William Cameron  is a 69 y.o.  male  with permanent atril fibrillation, hyperlipidemia with intolerance to statins, uncontrolled diabetes mellitus with stage 3 CKD, hypertension, morbid obesity with obstructive sleep apnea does not use CPAP regularly follows Dr. Brett Fairy, and severe 3 vessel CAD by coronary angiogram in Nov 2017 and recommended medical therapy due to diffuse disease.    He is presently on Repatha with improvement in lipid profile, although LDL still is not at goal.  However he has lost about 15 pounds in weight, will continue to reinforce compliance with weight loss and diet.  He is going through tremendous amount of stress in his life with his wife having been diagnosed with metastatic liver disease.  With regard to hypertension, blood pressure is much improved.  States that home blood pressure recordings have been even better around 120 mmHg range.  I did not make any changes to his medications with regard to this.  With regard to heart failure, he is now well compensated, there is no leg edema, will continue to reinforce weight loss and I will see him back in 3 months to improve compliance.  Adrian Prows, MD,  [facility] 04/21/2019, 4:14 PM [facility] [place]

## 2019-05-07 ENCOUNTER — Other Ambulatory Visit: Payer: Self-pay | Admitting: Cardiology

## 2019-05-07 DIAGNOSIS — R600 Localized edema: Secondary | ICD-10-CM

## 2019-05-07 NOTE — Telephone Encounter (Signed)
Please refill.

## 2019-05-14 LAB — BASIC METABOLIC PANEL
BUN: 21 (ref 4–21)
CO2: 26 — AB (ref 13–22)
Chloride: 101 (ref 99–108)
Creatinine: 1.4 — AB (ref <=1.3)
Glucose: 136
Potassium: 4.8 (ref 3.4–5.3)
Sodium: 136 — AB (ref 137–147)

## 2019-05-14 LAB — HEPATIC FUNCTION PANEL
ALT: 17 (ref 10–40)
AST: 21 (ref 14–40)
Alkaline Phosphatase: 58 (ref 25–125)
Bilirubin, Total: 0.6

## 2019-05-14 LAB — LIPID PANEL
Cholesterol: 134 (ref 0–200)
HDL: 44 (ref 35–70)
LDL Cholesterol: 64
LDl/HDL Ratio: 1.5
Triglycerides: 129 (ref 40–160)

## 2019-05-14 LAB — COMPREHENSIVE METABOLIC PANEL
Albumin: 4.1 (ref 3.5–5.0)
Calcium: 10 (ref 8.7–10.7)
GFR calc non Af Amer: 52

## 2019-05-14 LAB — HEMOGLOBIN A1C: Hemoglobin A1C: 7.9

## 2019-06-12 ENCOUNTER — Other Ambulatory Visit: Payer: Self-pay

## 2019-06-16 ENCOUNTER — Encounter: Payer: Self-pay | Admitting: Endocrinology

## 2019-06-16 ENCOUNTER — Other Ambulatory Visit: Payer: Self-pay

## 2019-06-16 ENCOUNTER — Ambulatory Visit (INDEPENDENT_AMBULATORY_CARE_PROVIDER_SITE_OTHER): Payer: Medicare Other | Admitting: Endocrinology

## 2019-06-16 VITALS — BP 112/74 | HR 58 | Ht 72.0 in | Wt 354.6 lb

## 2019-06-16 DIAGNOSIS — E1165 Type 2 diabetes mellitus with hyperglycemia: Secondary | ICD-10-CM

## 2019-06-16 DIAGNOSIS — Z794 Long term (current) use of insulin: Secondary | ICD-10-CM

## 2019-06-16 DIAGNOSIS — E1142 Type 2 diabetes mellitus with diabetic polyneuropathy: Secondary | ICD-10-CM | POA: Diagnosis not present

## 2019-06-16 NOTE — Progress Notes (Signed)
Patient ID: William Cameron, male   DOB: Apr 24, 1950, 69 y.o.   MRN: [id number]           Reason for Appointment: f/u  for Type 2 Diabetes  Referring PCP: Ralene Okoy Moreira   History of Present Illness:          Date of diagnosis of type 2 diabetes mellitus:   Around year 2000      Background history:  Patient has very poor recall about his initial diagnosis and previous treatment Only last office visit from PCP available and no other records available to review diabetes management and previous A1c results He does not know if he took metformin However in 2016 his A1c was 6% and he thinks that his blood sugars started going up only when he had staph infection in his foot in 7/19 when his A1c was 7.6   He apparently stopped [place]Farxiga in 2020 because of high out-of-pocket expense  Previously had tried Trulicity but apparently had nausea with this and not clear when this was stopped   Recent history:      INSULIN regimen: Humalog mix before breakfast and dinner: 12--24 units  Non-insulin hypoglycemic drugs the patient is taking are: None  His A1c is 7.9 %, previously 8%  Current management, blood sugar patterns and problems identified:  A1c was done through his PCP  He was started on premixed insulin in 9/20  He did not bring his meter for download.  Also he says because of his caring for his wife who is having health problems he is not able to remember to check his sugars and has irregular eating habits  He still takes his insulin in the morning even though he usually does not eat breakfast  He was told by his PCP last month that his blood sugars were high although lab glucose was 136  Previously stopped taking Metformin because of abdominal cramps  He has not checked his blood sugars except once or twice, glucose was 116 this morning  He is not aware of symptoms of hypoglycemia but has not apparently had any  This is despite his taking his insulin in the morning without eating  and also in the evening tends to take it after finishing his meal  He has taken arbitrary insulin doses, previously was taking 10 units in the morning and was told to take 22 at dinnertime  Has had a consultation with the dietitian  Overall he has been cutting back on his portions and eating relatively small meals or even just half sandwiches, he says that he is not able to plan his meals; occasionally however may eat fast food with his wife  He is drinking more water and less milk and soft drinks  He appears to be losing weight and likely has lost around 10 pounds at least        Side effects from medications have been: Nausea from Trulicity                  Exercise:  Unable to do any  Glucose monitoring:      PRE-MEAL Fasting Lunch Dinner Bedtime Overall  Glucose range:  135-218   144-254    Mean/median:      191    Dietician visit, most recent: 12/20 CDE visit: 9/20  Weight history:  Wt Readings from Last 3 Encounters:  06/16/19 (!) 354 lb 9.6 oz (160.8 kg)  04/21/19 (!) 347 lb (157.4 kg)  03/18/19 (!) 362 lb (164.2  kg)    Glycemic control:   Lab Results  Component Value Date   HGBA1C 7.9 05/14/2019   HGBA1C 8.0 (A) 02/26/2019   HGBA1C 9.6 (A) 12/10/2018   Lab Results  Component Value Date   MICROALBUR <0.7 12/31/2018   LDLCALC 64 05/14/2019   CREATININE 1.4 (A) 05/14/2019   Lab Results  Component Value Date   MICRALBCREAT 2.5 12/31/2018    No results found for: FRUCTOSAMINE  No visits with results within 1 Week(s) from this visit.  Latest known visit with results is:  Abstract on 05/19/2019  Component Date Value Ref Range Status  . Triglycerides 05/14/2019 129  40 - 160 Final  . Cholesterol 05/14/2019 134  0 - 200 Final  . HDL 05/14/2019 44  35 - 70 Final  . Glucose 05/14/2019 136   Final  . BUN 05/14/2019 21  4 - 21 Final  . CO2 05/14/2019 26* 13 - 22 Final  . Creatinine 05/14/2019 1.4* 0.6 - 1.3 Final  . Potassium 05/14/2019 4.8  3.4 - 5.3  Final  . Sodium 05/14/2019 136* 137 - 147 Final  . Chloride 05/14/2019 101  99 - 108 Final  . GFR calc non Af Amer 05/14/2019 52   Final  . Calcium 05/14/2019 10.0  8.7 - 10.7 Final  . Albumin 05/14/2019 4.1  3.5 - 5.0 Final  . LDl/HDL Ratio 05/14/2019 1.5   Final  . LDL Cholesterol 05/14/2019 64   Final  . Alkaline Phosphatase 05/14/2019 58  25 - 125 Final  . ALT 05/14/2019 17  10 - 40 Final  . AST 05/14/2019 21  14 - 40 Final  . Bilirubin, Total 05/14/2019 0.6   Final  . Hemoglobin A1C 05/14/2019 7.9   Final    Allergies as of 06/16/2019      Reactions   Penicillins Anaphylaxis   Has patient had a PCN reaction causing immediate rash, facial/tongue/throat swelling, SOB or lightheadedness with hypotension: Yes Has patient had a PCN reaction causing severe rash involving mucus membranes or skin necrosis: Yes Has patient had a PCN reaction that required hospitalization Yes Has patient had a PCN reaction occurring within the last 10 years: No If all of the above answers are "NO", then may proceed with Cephalosporin use.   Tetanus Toxoids Anaphylaxis, Shortness Of Breath, Swelling   Sulfa Antibiotics Nausea And Vomiting, Swelling   Trulicity [dulaglutide]    nausea      Medication List       Accurate as of June 16, 2019  2:29 PM. If you have any questions, ask your nurse or doctor.        amLODipine 5 MG tablet Commonly known as: NORVASC Take 5 mg by mouth daily.   b complex vitamins tablet Take 1 tablet by mouth daily. Take 1 tablet by mouth once daily.   benazepril 40 MG tablet Commonly known as: LOTENSIN Take 40 mg by mouth daily after supper.   Biotin 2500 MCG Caps Take 2,500 mcg by mouth daily. Take 1 tablet by mouth once daily.   Eliquis 5 MG Tabs tablet Generic drug: apixaban Take 5 mg by mouth 2 (two) times daily.   fenofibrate micronized 130 MG capsule Commonly known as: ANTARA Take 130 mg by mouth daily. Take 1 tablet by mouth once daily.   Insulin  Lispro Prot & Lispro (75-25) 100 UNIT/ML Kwikpen Commonly known as: HumaLOG Mix 75/25 KwikPen Inject 12 units under the skin in the morning and 22 units in the evening.  NovoFine Plus 32G X 4 MM Misc Generic drug: Insulin Pen Needle Use to inject insulin twice daily.   OneTouch Verio test strip Generic drug: glucose blood USE 1 STRIP TO CHECK GLUCOSE TWICE DAILY   Repatha 140 MG/ML Sosy Generic drug: Evolocumab Inject 1 Syringe into the skin every 14 (fourteen) days.   traMADol 50 MG tablet Commonly known as: Ultram Take 1 tablet (50 mg total) by mouth every 12 (twelve) hours as needed.   vitamin B-12 500 MCG tablet Commonly known as: CYANOCOBALAMIN Take 2,500 mcg by mouth daily. Take by mouth once daily.   Vitamin D 50 MCG (2000 UT) tablet Take 2,000 Units by mouth daily.       Allergies:  Allergies  Allergen Reactions  . Penicillins Anaphylaxis    Has patient had a PCN reaction causing immediate rash, facial/tongue/throat swelling, SOB or lightheadedness with hypotension: Yes Has patient had a PCN reaction causing severe rash involving mucus membranes or skin necrosis: Yes Has patient had a PCN reaction that required hospitalization Yes Has patient had a PCN reaction occurring within the last 10 years: No If all of the above answers are "NO", then may proceed with Cephalosporin use.  . Tetanus Toxoids Anaphylaxis, Shortness Of Breath and Swelling  . Sulfa Antibiotics Nausea And Vomiting and Swelling  . Trulicity [Dulaglutide]     nausea    Past Medical History:  Diagnosis Date  . Atrial fibrillation (HCC)   . Bronchitis    chronic bronchitis  . COPD (chronic obstructive pulmonary disease) (HCC)   . Diabetes mellitus without complication (HCC)   . Elevated lactic acid level   . Hyperlipidemia   . Hypertension   . PONV (postoperative nausea and vomiting)    post op nausea  . 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   Chase Picket   . Sleep apnea    cpap at night    Past Surgical History:  Procedure Laterality Date  . CARDIOVERSION N/A 01/25/2016   Procedure: CARDIOVERSION;  Surgeon: Yates Decamp, MD;  Location: [facility] ENDOSCOPY;  Service: Cardiovascular;  Laterality: N/A;  . COLONOSCOPY  02/02/2012   Procedure: COLONOSCOPY;  Surgeon: Theda Belfast, MD;  Location: [facility] ENDOSCOPY;  Service: Endoscopy;  Laterality: N/A;  . HERNIA REPAIR    . HYDROCELE EXCISION / REPAIR    . I & D EXTREMITY Left 11/24/2017   Procedure: IRRIGATION AND DEBRIDEMENT LEFT FOOT, REMOVAL OF TWO SCREWS, INSERTION OF ANTIBIOTIC BEADS;  Surgeon: Felecia Shelling, DPM;  Location: [facility] OR;  Service: Podiatry;  Laterality: Left;  . JOINT REPLACEMENT     rt knee  . ROTATOR CUFF REPAIR  rt  . TOTAL HIP ARTHROPLASTY Right 07/14/2014   Procedure: RIGHT TOTAL HIP ARTHROPLASTY;  Surgeon: Durene Romans, MD;  Location: [facility] ORS;  Service: Orthopedics;  Laterality: Right;    Family History  Problem Relation Age of Onset  . Diabetes type II Mother   . Prostate cancer Father 71    Social History:  reports that he has never smoked. He has never used smokeless tobacco. He reports that he does not drink alcohol or use drugs.   Review of Systems   Lipid history: He had muscle and joint pains with Lipitor and also with Crestor prescribed by PCP which he could not tolerate apparently  He has been on Repatha although irregular recently  Lab Results  Component Value Date   CHOL 134 05/14/2019   HDL 44 05/14/2019   LDLCALC 64 05/14/2019   TRIG 129 05/14/2019   CHOLHDL 5 12/31/2018           Hypertension: Has been on treatment, treatment includes benazepril 40 mg  BP Readings from Last 3 Encounters:  06/16/19 112/74  04/21/19 140/77  03/10/19 (!) 143/81   Has had mild renal dysfunction as follows:  Lab Results  Component Value Date   CREATININE 1.4 (A)  05/14/2019   CREATININE 1.45 12/31/2018   CREATININE 1.44 (H) 11/27/2017    Most recent eye exam was in 2019  Most recent foot exam: 9/20  Currently known complications of diabetes: Mild neuropathy    LABS:  No visits with results within 1 Week(s) from this visit.  Latest known visit with results is:  Abstract on 05/19/2019  Component Date Value Ref Range Status  . Triglycerides 05/14/2019 129  40 - 160 Final  . Cholesterol 05/14/2019 134  0 - 200 Final  . HDL 05/14/2019 44  35 - 70 Final  . Glucose 05/14/2019 136   Final  . BUN 05/14/2019 21  4 - 21 Final  . CO2 05/14/2019 26* 13 - 22 Final  . Creatinine 05/14/2019 1.4* 0.6 - 1.3 Final  . Potassium 05/14/2019 4.8  3.4 - 5.3 Final  . Sodium 05/14/2019 136* 137 - 147 Final  . Chloride 05/14/2019 101  99 - 108 Final  . GFR calc non Af Amer 05/14/2019 52   Final  . Calcium 05/14/2019 10.0  8.7 - 10.7 Final  . Albumin 05/14/2019 4.1  3.5 - 5.0 Final  . LDl/HDL Ratio 05/14/2019 1.5   Final  . LDL Cholesterol 05/14/2019 64   Final  . Alkaline Phosphatase 05/14/2019 58  25 - 125 Final  . ALT 05/14/2019 17  10 - 40 Final  . AST 05/14/2019 21  14 - 40 Final  . Bilirubin, Total 05/14/2019 0.6   Final  . Hemoglobin A1C 05/14/2019 7.9   Final    Physical Examination:  BP 112/74 (BP Location: Left Arm, Patient Position: Sitting, Cuff Size: Large)   Pulse (!) 58   Ht 6' (1.829 m)   Wt (!) 354 lb 9.6 oz (160.8 kg)   SpO2 97%   BMI 48.09 kg/m          ASSESSMENT:  Diabetes type 2 with obesity  See history of present illness for detailed discussion of current diabetes management, blood sugar patterns and problems identified  His A1c is 7.9% last month  He has had difficulty with having a routine for his insulin, meals and not monitoring blood sugar Although A1c was still notably high at 7.9 last month it was partly related to not taking his insulin for couple of weeks at least Difficult to assess his insulin dose because  of his not monitoring his blood sugar Again likely has postprandial hyperglycemia at times  He is unable to exercise because of his CHF and obesity However he is apparently losing weight, cutting back on portions and likely not eating full meals or even more than 1 meal a day sometimes Timing of insulin: He is not taking a meal when he is taking his morning insulin in the evening takes his Humalog mix after finishing his meal  Hyperlipidemia: Recent LDL was excellent with starting Repatha although he has taken this irregularly  Mild renal dysfunction: Stable as of last month  PLAN:  Restart glucose monitoring, some before insulin after meals  Discussed blood sugar targets  Explained to him that he may get hypoglycemia if he does not eat after taking his premixed insulin  Explained symptoms and treatment of hypoglycemia and given patient handout on this  He will take his insulin before his first meal of the day and not take it without eating  He will try to take his evening shot when he starts eating  Continue monitoring his diet and make sure he has some protein with every meal  Continue to keep soft drinks and excessive amounts of milk controlled  Follow-up with cardiologist for his lipids  Total visit time for evaluation and management of multiple problems and counseling = 30 minutes  Patient Instructions  Check blood sugars on waking up 1-2 days a week  Also check blood sugars about 2-3 hours after meals and do this after different meals by rotation  Recommended blood sugar levels on waking up are 90-130 and about 2 hours after meal is 130-180  Please bring your blood sugar monitor to each visit, thank you  Take am insulin before 1st meal of day and when starting to eat             Reather Littler 06/16/2019, 2:29 PM   Note: This office note was prepared with Dragon voice recognition system technology. Any transcriptional errors that result from this process  are unintentional.

## 2019-06-16 NOTE — Patient Instructions (Addendum)
Check blood sugars on waking up 1-2 days a week  Also check blood sugars about 2-3 hours after meals and do this after different meals by rotation  Recommended blood sugar levels on waking up are 90-130 and about 2 hours after meal is 130-180  Please bring your blood sugar monitor to each visit, thank you  Take am insulin before 1st meal of day and when starting to eat

## 2019-07-18 ENCOUNTER — Other Ambulatory Visit: Payer: Self-pay | Admitting: Cardiology

## 2019-07-18 DIAGNOSIS — I482 Chronic atrial fibrillation, unspecified: Secondary | ICD-10-CM

## 2019-07-18 DIAGNOSIS — I5032 Chronic diastolic (congestive) heart failure: Secondary | ICD-10-CM

## 2019-07-23 ENCOUNTER — Ambulatory Visit: Payer: Medicare Other | Admitting: Cardiology

## 2019-07-23 NOTE — Telephone Encounter (Signed)
Called patient to ask if he still taking Metoprolol. It is not listed on his medication list. LMTCB

## 2019-07-24 ENCOUNTER — Ambulatory Visit: Payer: Medicare Other | Admitting: Cardiology

## 2019-07-24 NOTE — Telephone Encounter (Signed)
Patient wants to know if he should be taking Metoprolol. It is not listed on his medication list.

## 2019-07-27 ENCOUNTER — Other Ambulatory Visit: Payer: Self-pay | Admitting: Endocrinology

## 2019-07-30 ENCOUNTER — Ambulatory Visit: Payer: Medicare Other | Admitting: Endocrinology

## 2019-08-28 ENCOUNTER — Other Ambulatory Visit: Payer: Self-pay

## 2019-08-28 MED ORDER — INSULIN LISPRO PROT & LISPRO (75-25 MIX) 100 UNIT/ML KWIKPEN: PEN_INJECTOR | SUBCUTANEOUS | 2 refills | Status: DC

## 2019-11-25 ENCOUNTER — Ambulatory Visit
Admission: RE | Admit: 2019-11-25 | Discharge: 2019-11-25 | Disposition: A | Discharge status: Home / Self Care | Payer: Medicare Other | Source: Ambulatory Visit | Attending: Internal Medicine | Admitting: Internal Medicine

## 2019-11-25 ENCOUNTER — Other Ambulatory Visit: Payer: Self-pay | Admitting: Internal Medicine

## 2019-11-25 ENCOUNTER — Other Ambulatory Visit: Payer: Self-pay

## 2019-11-25 DIAGNOSIS — R059 Cough, unspecified: Secondary | ICD-10-CM

## 2019-12-30 ENCOUNTER — Other Ambulatory Visit: Payer: Self-pay

## 2019-12-30 ENCOUNTER — Ambulatory Visit (INDEPENDENT_AMBULATORY_CARE_PROVIDER_SITE_OTHER): Payer: Medicare Other | Admitting: Pulmonary Disease

## 2019-12-30 ENCOUNTER — Ambulatory Visit (INDEPENDENT_AMBULATORY_CARE_PROVIDER_SITE_OTHER): Payer: Medicare Other

## 2019-12-30 ENCOUNTER — Encounter: Payer: Self-pay | Admitting: Pulmonary Disease

## 2019-12-30 VITALS — BP 128/60 | HR 56 | Temp 98.3°F | Ht 72.0 in | Wt 326.4 lb

## 2019-12-30 DIAGNOSIS — R0602 Shortness of breath: Secondary | ICD-10-CM

## 2019-12-30 DIAGNOSIS — U099 Post covid-19 condition, unspecified: Secondary | ICD-10-CM

## 2019-12-30 NOTE — Progress Notes (Signed)
William Cameron    [id number]    04-Jan-1951  Primary Care Physician:Moreira, Channing Mutters, MD  Referring Physician: Ralene Ok, MD [address] [place],  [place] [address]  Chief complaint: Consult for post COVID-65  HPI: 69 year old with history of diabetes mellitus, hypertension, chronic atrial fibrillation, stroke, CKD Diagnosed with COVID-19 on August 16.  He could not receive the monoclonal antibody since he is outside the 10-day window.  He did not require hospitalization Chest x-ray showed bilateral infiltrates consistent with pneumonia for which he received Z-Pak.  He has had a slow improvement since then and feels that he is back to normal He has chronic dyspnea on exertion which he attributes to atrial fibrillation had preceded his COVID-19 diagnosis.  He was diagnosed with OSA many years ago.  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  He has not tolerated the CPAP in the past and does not want to go back on it.  He has coronary artery disease and chronic atrial fibrillation and follows with Dr. Jacinto Halim.  Pets: Dogs Occupation: Retired Naval architect Exposures: No known exposures.  No mold, hot tub, Jacuzzi.  No feather pillows or comforters Smoking history: Never smoker Travel history: No significant travel history Relevant family history: No significant family history of lung disease   Outpatient Encounter Medications as of 12/30/2019  Medication Sig  . amLODipine (NORVASC) 5 MG tablet Take 5 mg by mouth daily.  Marland Kitchen apixaban (ELIQUIS) 5 MG TABS tablet Take 5 mg by mouth 2 (two) times daily.  . benazepril (LOTENSIN) 40 MG tablet Take 40 mg by mouth daily after supper.   . Insulin Lispro Prot & Lispro (HUMALOG MIX 75/25 KWIKPEN) (75-25) 100 UNIT/ML Kwikpen Inject 12-24 units under the skin before breakfast and dinner.  . Insulin Pen Needle (NOVOFINE PLUS) 32G X 4 MM MISC Use to inject insulin twice daily.  .  metoprolol succinate (TOPROL-XL) 25 MG 24 hr tablet TAKE 1 TABLET BY MOUTH ONCE DAILY **TAKE  WITH  OR  IMMEDIATELY  FOLLOWING  A  MEAL  . ONETOUCH VERIO test strip USE 1 STRIP TO CHECK GLUCOSE TWICE DAILY  . b complex vitamins tablet Take 1 tablet by mouth daily. Take 1 tablet by mouth once daily. (Patient not taking: Reported on 12/30/2019)  . Biotin 2500 MCG CAPS Take 2,500 mcg by mouth daily. Take 1 tablet by mouth once daily. (Patient not taking: Reported on 12/30/2019)  . Cholecalciferol (VITAMIN D) 2000 units tablet Take 2,000 Units by mouth daily.  (Patient not taking: Reported on 12/30/2019)  . Evolocumab (REPATHA) 140 MG/ML SOSY Inject 1 Syringe into the skin every 14 (fourteen) days. (Patient not taking: Reported on 06/16/2019)  . fenofibrate micronized (ANTARA) 130 MG capsule Take 130 mg by mouth daily. Take 1 tablet by mouth once daily. (Patient not taking: Reported on 12/30/2019)  . traMADol (ULTRAM) 50 MG tablet Take 1 tablet (50 mg total) by mouth every 12 (twelve) hours as needed. (Patient not taking: Reported on 12/30/2019)  . vitamin B-12 (CYANOCOBALAMIN) 500 MCG tablet Take 2,500 mcg by mouth daily. Take by mouth once daily. (Patient not taking: Reported on 12/30/2019)   No facility-administered encounter medications on file as of 12/30/2019.    Allergies as of 12/30/2019 - Review Complete 12/30/2019  Allergen Reaction Noted  . Penicillins Anaphylaxis 02/02/2012  . Tetanus toxoids Anaphylaxis, Shortness Of Breath, and Swelling  02/02/2012  . Sulfa antibiotics Nausea And Vomiting and Swelling 02/02/2012  . Trulicity [dulaglutide]  08/28/2018    Past Medical History:  Diagnosis Date  . Atrial fibrillation (HCC)   . Bronchitis    chronic bronchitis  . COPD (chronic obstructive pulmonary disease) (HCC)   . Diabetes mellitus without complication (HCC)   . Elevated lactic acid level   . Hyperlipidemia   . Hypertension   . PONV (postoperative nausea and vomiting)    post  op nausea  . 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   Chase Picket   . Sleep apnea    cpap at night    Past Surgical History:  Procedure Laterality Date  . CARDIOVERSION N/A 01/25/2016   Procedure: CARDIOVERSION;  Surgeon: Yates Decamp, MD;  Location: [facility] ENDOSCOPY;  Service: Cardiovascular;  Laterality: N/A;  . COLONOSCOPY  02/02/2012   Procedure: COLONOSCOPY;  Surgeon: Theda Belfast, MD;  Location: [facility] ENDOSCOPY;  Service: Endoscopy;  Laterality: N/A;  . HERNIA REPAIR    . HYDROCELE EXCISION / REPAIR    . I & D EXTREMITY Left 11/24/2017   Procedure: IRRIGATION AND DEBRIDEMENT LEFT FOOT, REMOVAL OF TWO SCREWS, INSERTION OF ANTIBIOTIC BEADS;  Surgeon: Felecia Shelling, DPM;  Location: [facility] OR;  Service: Podiatry;  Laterality: Left;  . JOINT REPLACEMENT     rt knee  . ROTATOR CUFF REPAIR  rt  . TOTAL HIP ARTHROPLASTY Right 07/14/2014   Procedure: RIGHT TOTAL HIP ARTHROPLASTY;  Surgeon: Durene Romans, MD;  Location: [facility] ORS;  Service: Orthopedics;  Laterality: Right;    Family History  Problem Relation Age of Onset  . Diabetes type II Mother   . Prostate cancer Father 26    Social History   Socioeconomic History  . Marital status: Married    Spouse name: [name]  . Number of children: 1  . Years of education: 12+  . Highest education level: Not on file  Occupational History    Employer: [employer]  Tobacco Use  . Smoking status: Never Smoker  . Smokeless tobacco: Never Used  Vaping Use  . Vaping Use: Never used  Substance and Sexual Activity  . Alcohol use: No  . Drug use: No  . Sexual activity: Never  Other Topics Concern  . Not on file  Social History Narrative   Patient is married [name]) and lives at home with his wife.   Patient has one adult child.   Patient is working full-time.   Patient has a high school and auto classes.   Patient is right-handed.    Patient drinks two cups of coffee every morning and very little tea.   Social Determinants of Health   Financial Resource Strain:   . Difficulty of Paying Living Expenses: Not on file  Food Insecurity:   . Worried About Programme researcher, broadcasting/film/video in the Last Year: Not on file  . Ran Out of Food in the Last Year: Not on file  Transportation Needs:   . Lack of Transportation (Medical): Not on file  . Lack of Transportation (Non-Medical): Not on file  Physical Activity:   . Days of Exercise per Week: Not on file  . Minutes of Exercise per Session: Not on file  Stress:   . Feeling of Stress : Not on file  Social Connections:   . Frequency of Communication with Friends and Family:  Not on file  . Frequency of Social Gatherings with Friends and Family: Not on file  . Attends Religious Services: Not on file  . Active Member of Clubs or Organizations: Not on file  . Attends Banker Meetings: Not on file  . Marital Status: Not on file  Intimate Partner Violence:   . Fear of Current or Ex-Partner: Not on file  . Emotionally Abused: Not on file  . Physically Abused: Not on file  . Sexually Abused: Not on file    Review of systems: Review of Systems  Constitutional: Negative for fever and chills.  HENT: Negative.   Eyes: Negative for blurred vision.  Respiratory: as per HPI  Cardiovascular: Negative for chest pain and palpitations.  Gastrointestinal: Negative for vomiting, diarrhea, blood per rectum. Genitourinary: Negative for dysuria, urgency, frequency and hematuria.  Musculoskeletal: Negative for myalgias, back pain and joint pain.  Skin: Negative for itching and rash.  Neurological: Negative for dizziness, tremors, focal weakness, seizures and loss of consciousness.  Endo/Heme/Allergies: Negative for environmental allergies.  Psychiatric/Behavioral: Negative for depression, suicidal ideas and hallucinations.  All other systems reviewed and are negative.  Physical  Exam: Blood pressure 128/60, pulse (!) 56, temperature 98.3 F (36.8 C), temperature source Skin, height 6' (1.829 m), weight (!) 326 lb 6.4 oz (148.1 kg), SpO2 97 %. Gen:      No acute distress HEENT:  EOMI, sclera anicteric Neck:     No masses; no thyromegaly Lungs:    Clear to auscultation bilaterally; normal respiratory effort CV:         Regular rate and rhythm; no murmurs Abd:      + bowel sounds; soft, non-tender; no palpable masses, no distension Ext:    No edema; adequate peripheral perfusion Skin:      Warm and dry; no rash Neuro: alert and oriented x 3 Psych: normal mood and affect  Data Reviewed: Imaging: Chest x-ray 11/25/2019-patchy bilateral pulmonary infiltrates.  I have reviewed the images personally.  PFTs:   Labs:  Sleep: Sleep study 09/11/2013-significant OSA with AHI 49  Assessment:  Post COVID-19 Previous chest x-ray reviewed with bilateral infiltrates. Overall he is improved and feels he is back to baseline We will get a chest x-ray today and PFTs.  If still abnormal then consider high-res CT  OSA Does not want to go back on CPAP Encouraged weight loss with diet and exercise  Plan/Recommendations: Chest x-ray, PFTs Weight loss  Chilton Greathouse MD [facility] 12/30/2019, 9:45 AM  CC: Ralene Ok, MD

## 2019-12-30 NOTE — Patient Instructions (Signed)
We will get a chest x-ray today for evaluation of the lung and schedule pulmonary function test Follow-up in 3 months.

## 2020-02-15 ENCOUNTER — Other Ambulatory Visit: Payer: Self-pay | Admitting: Endocrinology

## 2020-02-17 ENCOUNTER — Other Ambulatory Visit: Payer: Self-pay | Admitting: Endocrinology

## 2020-02-17 NOTE — Telephone Encounter (Signed)
Patient called to state that he is at the [facility] listed herein to pick up RX for Insulin Lispro Prot & Lispro (HUMALOG MIX 75/25 KWIKPEN) (75-25) 100 UNIT/ML Kwikpen. Patient states that PHARM told him they sent RX request to our office yesterday with no response.

## 2020-02-23 ENCOUNTER — Encounter: Payer: Self-pay | Admitting: Endocrinology

## 2020-02-23 ENCOUNTER — Other Ambulatory Visit: Payer: Self-pay

## 2020-02-23 ENCOUNTER — Other Ambulatory Visit: Payer: Self-pay | Admitting: *Deleted

## 2020-02-23 ENCOUNTER — Ambulatory Visit (INDEPENDENT_AMBULATORY_CARE_PROVIDER_SITE_OTHER): Payer: Medicare Other | Admitting: Endocrinology

## 2020-02-23 VITALS — BP 138/84 | HR 71 | Ht 72.0 in | Wt 334.8 lb

## 2020-02-23 DIAGNOSIS — Z794 Long term (current) use of insulin: Secondary | ICD-10-CM

## 2020-02-23 DIAGNOSIS — I10 Essential (primary) hypertension: Secondary | ICD-10-CM

## 2020-02-23 DIAGNOSIS — E1165 Type 2 diabetes mellitus with hyperglycemia: Secondary | ICD-10-CM

## 2020-02-23 LAB — POCT GLYCOSYLATED HEMOGLOBIN (HGB A1C): Hemoglobin A1C: 6.3 % — AB (ref 4.0–5.6)

## 2020-02-23 MED ORDER — INSULIN LISPRO PROT & LISPRO (75-25 MIX) 100 UNIT/ML KWIKPEN
PEN_INJECTOR | SUBCUTANEOUS | 0 refills | Status: DC
Start: 2020-02-23 — End: 2020-04-08

## 2020-02-23 NOTE — Progress Notes (Signed)
Patient ID: William Cameron, male   DOB: Feb 14, 1951, 69 y.o.   MRN: [id number]           Reason for Appointment: f/u  for Type 2 Diabetes  Referring PCP: Ralene Ok   History of Present Illness:          Date of diagnosis of type 2 diabetes mellitus:   Around year 2000      Background history:  Patient has very poor recall about his initial diagnosis and previous treatment Also no records available to review diabetes management and previous A1c results He does not know if he took metformin However in 2016 his A1c was 6% and he thinks that his blood sugars started going up only when he had staph infection in his foot in 7/19 when his A1c was 7.6   He apparently stopped [place] in 2020 because of high out-of-pocket expense  Previously had tried Trulicity but apparently had nausea with this and not clear when this was stopped   Recent history:      INSULIN regimen: Humalog mix 12 units in a.m. and before dinner: 22 units  Non-insulin hypoglycemic drugs the patient is taking are: None  His A1c is 6.3 compared to 7.9 in 3/21  Current management, blood sugar patterns and problems identified:   He was started on premixed insulin in 9/20  He has not been seen in follow-up for several months  He again forgets to check his sugars after dinner and only checking it in the morning  He said that he is mostly eating 1 large meal in the evening around 6 PM  Overall blood sugars are fairly stable and less fluctuating compared to the last visit  He still takes his insulin in the morning even though he usually does not eat breakfast  He does not think he feels weak or shaky midday even with taking the morning insulin  He has however lost 20 pounds from generally improving diet, cutting back on sweets  Previously was also drinking a lot of soft drinks and milk and likely not following diet because of stress  He thinks he is a little more active and is now being helped with lifestyle  changes with a friend Only has 1 reading after dinner which was 176  In the last few days has gone off his diet because of the holiday                   Glucose monitoring:  Using One Touch Verio  FASTING range for the last 30 days = 103-176 with AVERAGE 126  Previous readings:   PRE-MEAL Fasting Lunch Dinner Bedtime Overall  Glucose range:  135-218   144-254    Mean/median:      191    Dietician visit, most recent: 12/20 CDE visit: 9/20  Weight history:  Wt Readings from Last 3 Encounters:  02/23/20 (!) 334 lb 12.8 oz (151.9 kg)  12/30/19 (!) 326 lb 6.4 oz (148.1 kg)  06/16/19 (!) 354 lb 9.6 oz (160.8 kg)    Glycemic control:   Lab Results  Component Value Date   HGBA1C 6.3 (A) 02/23/2020   HGBA1C 7.9 05/14/2019   HGBA1C 8.0 (A) 02/26/2019   Lab Results  Component Value Date   MICROALBUR <0.7 12/31/2018   LDLCALC 64 05/14/2019   CREATININE 1.4 (A) 05/14/2019   Lab Results  Component Value Date   MICRALBCREAT 2.5 12/31/2018    No results found for: FRUCTOSAMINE  Office Visit  on 02/23/2020  Component Date Value Ref Range Status  . Hemoglobin A1C 02/23/2020 6.3* 4.0 - 5.6 % Final    Allergies as of 02/23/2020      Reactions   Penicillins Anaphylaxis   Has patient had a PCN reaction causing immediate rash, facial/tongue/throat swelling, SOB or lightheadedness with hypotension: Yes Has patient had a PCN reaction causing severe rash involving mucus membranes or skin necrosis: Yes Has patient had a PCN reaction that required hospitalization Yes Has patient had a PCN reaction occurring within the last 10 years: No If all of the above answers are "NO", then may proceed with Cephalosporin use.   Tetanus Toxoids Anaphylaxis, Shortness Of Breath, Swelling   Sulfa Antibiotics Nausea And Vomiting, Swelling   Trulicity [dulaglutide]    nausea      Medication List       Accurate as of February 23, 2020 12:24 PM. If you have any questions, ask your nurse or  doctor.        STOP taking these medications   traMADol 50 MG tablet Commonly known as: Ultram Stopped by: Reather LittlerAjay Aveleen Nevers, MD     TAKE these medications   amLODipine 5 MG tablet Commonly known as: NORVASC Take 5 mg by mouth daily.   b complex vitamins tablet Take 1 tablet by mouth daily. Take 1 tablet by mouth once daily.   benazepril 40 MG tablet Commonly known as: LOTENSIN Take 40 mg by mouth daily after supper.   Biotin 2500 MCG Caps Take 2,500 mcg by mouth daily. Take 1 tablet by mouth once daily.   Eliquis 5 MG Tabs tablet Generic drug: apixaban Take 5 mg by mouth 2 (two) times daily.   fenofibrate micronized 130 MG capsule Commonly known as: ANTARA Take 130 mg by mouth daily. Take 1 tablet by mouth once daily.   Insulin Lispro Prot & Lispro (75-25) 100 UNIT/ML Kwikpen Commonly known as: HumaLOG Mix 75/25 KwikPen INJECT 12 UNITS SUBCUTANEOUSLY IN THE MORNING AND 22 UNITS IN THE EVENING   metoprolol succinate 25 MG 24 hr tablet Commonly known as: TOPROL-XL TAKE 1 TABLET BY MOUTH ONCE DAILY **TAKE  WITH  OR  IMMEDIATELY  FOLLOWING  A  MEAL   NovoFine Plus 32G X 4 MM Misc Generic drug: Insulin Pen Needle Use to inject insulin twice daily.   OneTouch Verio test strip Generic drug: glucose blood USE 1 STRIP TO CHECK GLUCOSE TWICE DAILY   Repatha 140 MG/ML Sosy Generic drug: Evolocumab Inject 1 Syringe into the skin every 14 (fourteen) days.   vitamin B-12 500 MCG tablet Commonly known as: CYANOCOBALAMIN Take 2,500 mcg by mouth daily. Take 2500mcg by mouth once daily.   Vitamin D 50 MCG (2000 UT) tablet Take 2,000 Units by mouth daily.       Allergies:  Allergies  Allergen Reactions  . Penicillins Anaphylaxis    Has patient had a PCN reaction causing immediate rash, facial/tongue/throat swelling, SOB or lightheadedness with hypotension: Yes Has patient had a PCN reaction causing severe rash involving mucus membranes or skin necrosis: Yes Has patient had  a PCN reaction that required hospitalization Yes Has patient had a PCN reaction occurring within the last 10 years: No If all of the above answers are "NO", then may proceed with Cephalosporin use.  . Tetanus Toxoids Anaphylaxis, Shortness Of Breath and Swelling  . Sulfa Antibiotics Nausea And Vomiting and Swelling  . Trulicity [Dulaglutide]     nausea    Past Medical History:  Diagnosis Date  .  Atrial fibrillation (HCC)   . Bronchitis    chronic bronchitis  . COPD (chronic obstructive pulmonary disease) (HCC)   . Diabetes mellitus without complication (HCC)   . Elevated lactic acid level   . Hyperlipidemia   . Hypertension   . PONV (postoperative nausea and vomiting)    post op nausea  . 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   [place]ase Picket   . Sleep apnea    cpap at night    Past Surgical History:  Procedure Laterality Date  . CARDIOVERSION N/A 01/25/2016   Procedure: CARDIOVERSION;  Surgeon: Yates Decamp, MD;  [facility]: [facility] ENDOSCOPY;  Service: Cardiovascular;  Laterality: N/A;  . COLONOSCOPY  02/02/2012   Procedure: COLONOSCOPY;  Surgeon: Theda Belfast, MD;  Location: [facility] ENDOSCOPY;  Service: Endoscopy;  Laterality: N/A;  . HERNIA REPAIR    . HYDROCELE EXCISION / REPAIR    . I & D EXTREMITY Left 11/24/2017   Procedure: IRRIGATION AND DEBRIDEMENT LEFT FOOT, REMOVAL OF TWO SCREWS, INSERTION OF ANTIBIOTIC BEADS;  Surgeon: Felecia Shelling, DPM;  Location: [facility] OR;  Service: Podiatry;  Laterality: Left;  . JOINT REPLACEMENT     rt knee  . ROTATOR CUFF REPAIR  rt  . TOTAL HIP ARTHROPLASTY Right 07/14/2014   Procedure: RIGHT TOTAL HIP ARTHROPLASTY;  Surgeon: Durene Romans, MD;  Location: [facility] ORS;  Service: Orthopedics;  Laterality: Right;    Family History  Problem Relation Age of Onset  . Diabetes type II Mother   . Prostate cancer Father 75    Social History:   reports that he has never smoked. He has never used smokeless tobacco. He reports that he does not drink alcohol and does not use drugs.   Review of Systems   Lipid history: He had muscle and joint pains with Lipitor and also with Crestor prescribed by PCP which he could not tolerate apparently  He has been on Repatha although he ran out of his prescription and has not had a follow-up with his cardiologist recently    Lab Results  Component Value Date   CHOL 134 05/14/2019   HDL 44 05/14/2019   LDLCALC 64 05/14/2019   TRIG 129 05/14/2019   CHOLHDL 5 12/31/2018           Hypertension: Has been on treatment, treatment includes benazepril 40 mg and amlodipine followed by PCP  BP Readings from Last 3 Encounters:  02/23/20 138/84  12/30/19 128/60  06/16/19 112/74   Has had mild renal dysfunction as follows:  Lab Results  Component Value Date   CREATININE 1.4 (A) 05/14/2019   CREATININE 1.45 12/31/2018   CREATININE 1.44 (H) 11/27/2017    Most recent eye exam was in 2019  Most recent foot exam: 9/20  Currently known complications of diabetes: Mild neuropathy    LABS:  Office Visit on 02/23/2020  Component Date Value Ref Range Status  . Hemoglobin A1C 02/23/2020 6.3* 4.0 - 5.6 % Final    Physical Examination:  BP 138/84   Pulse 71   Ht 6' (1.829 m)   Wt (!) 334 lb 12.8 oz (151.9 kg)   SpO2 97%   BMI 45.41 kg/m          ASSESSMENT:  Diabetes type 2 with obesity  See history of present illness for detailed discussion of current diabetes management, blood sugar patterns and problems identified  His A1c is 6.3  His blood sugars are much better overall compared to the last few years He has been more consistent with his insulin doses Also likely doing better with diet overall with calorie reduction He is more active generally also and has lost 20 pounds Lifestyle changes are probably helping his blood sugar control  He still likes to eat only 1 meal a  day but appears to be not having any low sugars with taking 12 units in the morning No readings available after dinner except once  Hyperlipidemia: Recent LDL was excellent with Repatha although he has not followed up with his cardiologist to renew the prescription  Renal dysfunction: Labs to be obtained from PCP, not clear if he has had microalbuminuria  He is still a candidate for the use of medications like Marcelline Deist but he cannot afford this  PLAN:     Discussed glucose monitoring, try to do more readings after his evening meal and less in the morning  Also can occasionally check blood sugars midday  Discussed blood sugar targets fasting and after meals  He will try to cut back on portions and high-fat meals in the evening also consistently especially when he says his blood sugars going up with certain foods or drinks  No change in insulin dose as yet  He thinks he can try to increase his activity level which should help with further weight loss  Needs follow-up urine microalbumin  Schedule follow-up with cardiologist for his lipid management, likely needs to go back to Repatha  Annual eye exams  Recommended influenza and Covid vaccines but he refuses both and will not accept any patient information material to be given also.  Emphasized the need for Covid vaccine especially with his comorbid conditions and explained that vaccines are very safe and he should not be swayed by nonscientific misinformation    Patient Instructions  Check blood sugars on waking up 2-3 days a week  Also check blood sugars about 2 hours after meals and do this after different meals by rotation  Recommended blood sugar levels on waking up are 90-130 and about 2 hours after meal is 130-180  Please bring your blood sugar monitor to each visit, thank you          Reather Littler 02/23/2020, 12:24 PM   Note: This office note was prepared with Dragon voice recognition system technology. Any  transcriptional errors that result from this process are unintentional.

## 2020-02-23 NOTE — Patient Instructions (Addendum)
Check blood sugars on waking up 2-3 days a week  Also check blood sugars about 2 hours after meals and do this after different meals by rotation  Recommended blood sugar levels on waking up are 90-130 and about 2 hours after meal is 130-180  Please bring your blood sugar monitor to each visit, thank you   

## 2020-02-24 LAB — MICROALBUMIN / CREATININE URINE RATIO
Creatinine,U: 60.5 mg/dL
Microalb Creat Ratio: 8.5 mg/g (ref 0.0–30.0)
Microalb, Ur: 5.1 mg/dL — ABNORMAL HIGH (ref 0.0–1.9)

## 2020-04-07 ENCOUNTER — Other Ambulatory Visit: Payer: Self-pay

## 2020-04-07 ENCOUNTER — Encounter: Payer: Self-pay | Admitting: Cardiology

## 2020-04-07 ENCOUNTER — Ambulatory Visit: Payer: Medicare Other | Admitting: Cardiology

## 2020-04-07 VITALS — BP 140/84 | HR 47 | Resp 16 | Ht 72.0 in | Wt 339.2 lb

## 2020-04-07 DIAGNOSIS — I4821 Permanent atrial fibrillation: Secondary | ICD-10-CM

## 2020-04-07 DIAGNOSIS — I25118 Atherosclerotic heart disease of native coronary artery with other forms of angina pectoris: Secondary | ICD-10-CM

## 2020-04-07 DIAGNOSIS — I5032 Chronic diastolic (congestive) heart failure: Secondary | ICD-10-CM

## 2020-04-07 DIAGNOSIS — E782 Mixed hyperlipidemia: Secondary | ICD-10-CM

## 2020-04-07 MED ORDER — ROSUVASTATIN CALCIUM 20 MG PO TABS: 20.0000 mg | ORAL_TABLET | Freq: Every day | ORAL | 3 refills | Status: DC

## 2020-04-07 NOTE — Progress Notes (Signed)
Primary Physician/Referring:  Jilda Panda, MD  Patient ID: William Cameron, male    DOB: September 30, 1950, 70 y.o.   MRN: [id number]  Chief Complaint  Patient presents with  . Atrial Fibrillation  . Coronary Artery Disease    HPI: LION William Cameron  is a 70 y.o. male  with permanent atril fibrillation, hyperlipidemia with intolerance to statins, uncontrolled diabetes mellitus with stage 3 CKD, hypertension, morbid obesity with obstructive sleep apnea does not use CPAP regularly follows Dr. Brett Fairy, and severe 3 vessel CAD by coronary angiogram in Nov 2017 and recommended medical therapy due to diffuse disease.  He had Covid pneumonia in Nov 2021.   He presents here for here for an annual visit of CAD. He is presently doing well, states that dyspnea is remained stable.  He has no specific complaints today.   Past Medical History:  Diagnosis Date  . Atrial fibrillation ([place])   . Bronchitis    chronic bronchitis  . COPD (chronic obstructive pulmonary disease) ([place])   . Diabetes mellitus without complication ([place])   . Elevated lactic acid level   . Hyperlipidemia   . Hypertension   . PONV (postoperative nausea and vomiting)    post op nausea  . 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   William Cameron   . Sleep apnea    cpap at night    Past Surgical History:  Procedure Laterality Date  . CARDIOVERSION N/A 01/25/2016   Procedure: CARDIOVERSION;  Surgeon: Adrian Prows, MD;  Location: [facility] ENDOSCOPY;  Service: Cardiovascular;  Laterality: N/A;  . COLONOSCOPY  02/02/2012   Procedure: COLONOSCOPY;  Surgeon: Beryle Beams, MD;  Location: [facility] ENDOSCOPY;  Service: Endoscopy;  Laterality: N/A;  . HERNIA REPAIR    . HYDROCELE EXCISION / REPAIR    . I & D EXTREMITY Left 11/24/2017   Procedure: IRRIGATION AND DEBRIDEMENT LEFT FOOT, REMOVAL OF TWO SCREWS, INSERTION OF ANTIBIOTIC BEADS;   Surgeon: Edrick Kins, DPM;  Location: [place];  Service: Podiatry;  Laterality: Left;  . JOINT REPLACEMENT     rt knee  . ROTATOR CUFF REPAIR  rt  . TOTAL HIP ARTHROPLASTY Right 07/14/2014   Procedure: RIGHT TOTAL HIP ARTHROPLASTY;  Surgeon: Paralee Cancel, MD;  Location: [facility] ORS;  Service: Orthopedics;  Laterality: Right;   Social History   Tobacco Use  . Smoking status: Never Smoker  . Smokeless tobacco: Never Used  Substance Use Topics  . Alcohol use: No  marital Status: Widowed    Current Outpatient Medications on File Prior to Visit  Medication Sig Dispense Refill  . amLODipine (NORVASC) 5 MG tablet Take 5 mg by mouth daily.    Marland Kitchen apixaban (ELIQUIS) 5 MG TABS tablet Take 5 mg by mouth 2 (two) times daily.    . benazepril (LOTENSIN) 40 MG tablet Take 40 mg by mouth daily after supper.     . Insulin Lispro Prot & Lispro (HUMALOG MIX 75/25 KWIKPEN) (75-25) 100 UNIT/ML Kwikpen INJECT 12 UNITS SUBCUTANEOUSLY IN THE MORNING AND 22 UNITS IN THE EVENING 15 mL 0  . Insulin Pen Needle (NOVOFINE PLUS) 32G X 4 MM MISC Use to inject insulin twice daily. 100 each 2  . metoprolol succinate (TOPROL-XL) 25 MG 24 hr tablet TAKE 1 TABLET BY MOUTH ONCE DAILY **TAKE  WITH  OR  IMMEDIATELY  FOLLOWING  A  MEAL 90 tablet  0  . ONETOUCH VERIO test strip USE 1 STRIP TO CHECK GLUCOSE TWICE DAILY 100 each 2  . vitamin B-12 (CYANOCOBALAMIN) 500 MCG tablet Take 2,500 mcg by mouth daily. Take 2534mg by mouth once daily.     No current facility-administered medications on file prior to visit.   Review of Systems  Constitutional: Positive for malaise/fatigue.  Cardiovascular: Positive for dyspnea on exertion and leg swelling (improved on lasix). Negative for syncope.  Respiratory: Positive for sleep disturbances due to breathing (has OSA but not on CPAP). Negative for cough and sputum production.   Hematologic/Lymphatic: Does not bruise/bleed easily.  Skin: Positive for color change (from leg infection).   Musculoskeletal: Positive for arthritis (diffuse), back pain and joint pain. Negative for joint swelling.  Neurological: Negative for headaches and light-headedness.  Psychiatric/Behavioral: Negative for depression and substance abuse. The patient has insomnia (wife diagnosed with liver cancer) and is nervous/anxious.   All other systems reviewed and are negative.  Objective  Blood pressure 140/84, pulse (!) 47, resp. rate 16, height 6' (1.829 m), weight (!) 339 lb 3.2 oz (153.9 kg), SpO2 95 %. Body mass index is 46 kg/m.   Vitals with BMI 04/07/2020 02/23/2020 12/30/2019  Height 6' 0"  6' 0"  6' 0"   Weight 339 lbs 3 oz 334 lbs 13 oz 326 lbs 6 oz  BMI 45.99 [phone] Systolic [id number] Diastolic 84 84 60  Pulse 47 71 56     Physical Exam Constitutional:      General: He is not in acute distress.    Appearance: He is well-developed.     Comments: Morbidly obese  HENT:     Head: Atraumatic.  Neck:     Comments: Short neck and difficult to evaluate JVP Cardiovascular:     Rate and Rhythm: Normal rate. Rhythm irregular.     Pulses:          Carotid pulses are 2+ on the right side and 2+ on the left side.      Dorsalis pedis pulses are 2+ on the right side and 2+ on the left side.       Posterior tibial pulses are 2+ on the right side and 2+ on the left side.     Heart sounds: Normal heart sounds. No murmur heard. No gallop.      Comments: Femoral and popliteal pulse difficult to feel due to patient's body habitus.  No edema.  Pulmonary:     Effort: Pulmonary effort is normal.     Breath sounds: Normal breath sounds.  Abdominal:     General: Bowel sounds are normal.     Palpations: Abdomen is soft.     Comments: Obese. Pannus present    Radiology: No results found.  Laboratory examination:    CMP Latest Ref Rng & Units 05/14/2019 12/31/2018 11/27/2017  Glucose 70 - 99 mg/dL - 175(H) 142(H)  BUN 4 - 21 21 21 19   Creatinine 0.6 - 1.3 1.4(A) 1.45 1.44(H)  Sodium 137 -  147 136(A) 133(L) 138  Potassium 3.4 - 5.3 4.8 4.8 4.1  Chloride 99 - 108 101 99 104  CO2 13 - 22 26(A) 26 26  Calcium 8.7 - 10.7 10.0 10.8(H) 9.5  Total Protein 6.0 - 8.3 g/dL - 8.2 6.6  Total Bilirubin 0.2 - 1.2 mg/dL - 0.6 0.9  Alkaline Phos 25 - 125 58 53 56  AST 14 - 40 21 45(H) 18  ALT 10 - 40 17 48 12  CBC Latest Ref Rng & Units 11/27/2017 11/26/2017 11/25/2017  WBC 4.0 - 10.5 K/uL 10.1 7.4 9.5  Hemoglobin 13.0 - 17.0 g/dL 13.5 12.4(L) 12.4(L)  Hematocrit 39.0 - 52.0 % 43.6 39.9 39.5  Platelets 150 - 400 K/uL 169 165 199   Lipid Panel Recent Labs    05/14/19 0000  CHOL 134  TRIG 129  LDLCALC 64  HDL 44     HEMOGLOBIN A1C Lab Results  Component Value Date   HGBA1C 6.3 (A) 02/23/2020   MPG 171.42 10/08/2017   TSH No results for input(s): TSH in the last 8760 hours.   Labs 04/19/2018: Total cholesterol 219, triglycerides 175, HDL 42, LDL 142.  HB 16.2/HCT 50.1, platelets 167.  Potassium 4.8, BUN 21, creatinine 1.33, eGFR 55 mL.  A1c 8.0%.  Cardiac Studies:   Direct current cardioversion 01/25/2016: 120 x1, 150x1 to NSR.  Exercise sestamibi stress test 12/27/2015: 1. Two day protocol followed. The resting electrocardiogram demonstrated normal sinus rhythm, incomplete RBBB and no resting arrhythmias.  Low voltage and  poor R progression. The stress electrocardiogram was normal. Patient exercised on Bruce protocol for 4:13 minutes and achieved  4.84 METS. Stress test terminated due to % MPHR achieved (Target HR >85%). Symptoms included dyspnea. 2. The perfusion imaging study demonstrates diaphragmatic attenuation artifact in the inferior wall. There is no demonstrable ischemia or scar. LV systolic function was normal at 64%. This is a low risk study. 01/24/2016: Impression: EKG 11/13/2017: Atrial fibrillation with controlled ventricular response at the rate of 66 bpm, leftward axis, poor R-wave progression, cannot exclude anterolateral infarct old. Low-voltage complexes.  Nonspecific T abnormality. No significant change from EKG 11/15/2016.  Coronary angiogram 01/24/2016: Mid LAD occluded, D2 small to moderate sized proximal 80%. Mid Cx occluded with moderate OM1 which bifurcates. Both calcific CTO. RCA PL 1 has 80% stenosis (small) and PL3 occluded, mod-large vessel. LVEF 50-55% with apical akinesis.   Echocardiogram  05/13/2018:  Left ventricle cavity is normal in size. Normal global wall motion. Unable to evaluate diastolic function due to A. Fibrillation. Calculated EF 59%. Left atrial cavity is severely dilated at 5.4 cm. Right ventricle cavity is mild to moderately dilated. Mildly reduced right ventricular function. A moderateor band is noted at the RV apex (normal variant). Trace mitral regurgitation. Mild calcification of the mitral valve annulus. Mild mitral valve leaflet thickening. Trace tricuspid regurgitation. Unable to estimate PA pressure due to absence/minimal TR signal. IVC is dilated with poor inspiration collapse consistent with elevated right atrial pressure. Compared to the study done on 01/04/2016, no significant change.  EKG:   EKG 04/07/2020: Atrial fibrillation with controlled ventricular response at the rate of 47 bpm, left axis deviation, left anterior fascicular block.  Incomplete right bundle branch block.  Poor R wave progression, anteroseptal infarct old.  Low-voltage complexes.  Pulmonary disease pattern.    EKG 04/09/2018: Sinus arrest with junctional escape rhythm at the rate of 54 bpm, left axis deviation, left anterior fascicular block.  Incomplete right bundle branch block.  Anterolateral infarct old.  Low-voltage complexes.  Nonspecific T abnormality.   Assessment     ICD-10-CM   1. Coronary artery disease of native artery of native heart with stable angina pectoris (HCC)  I25.118 rosuvastatin (CRESTOR) 20 MG tablet  2. Chronic diastolic (congestive) heart failure (HCC)  I50.32 EKG 12-Lead  3. Permanent atrial fibrillation  (HCC) CHA2DS2-VASc Score is 4. Yearly risk of stroke: 4.  I48.21   4. Mixed hyperlipidemia  E78.2 rosuvastatin (CRESTOR) 20 MG  tablet    Lipid Panel With LDL/HDL Ratio    Lipid Panel With LDL/HDL Ratio     Meds ordered this encounter  Medications  . rosuvastatin (CRESTOR) 20 MG tablet    Sig: Take 1 tablet (20 mg total) by mouth daily.    Dispense:  90 tablet    Refill:  3    Medications Discontinued During This Encounter  Medication Reason  . b complex vitamins tablet Patient Preference  . Biotin 2500 MCG CAPS Patient Preference  . Evolocumab (REPATHA) 140 MG/ML SOSY Error  . fenofibrate micronized (ANTARA) 130 MG capsule Patient Preference  . Cholecalciferol (VITAMIN D) 2000 units tablet Error    Orders Placed This Encounter  Procedures  . Lipid Panel With LDL/HDL Ratio    Standing Status:   Future    Number of Occurrences:   1    Standing Expiration Date:   04/07/2021  . EKG 12-Lead     Recommendations:   SUHAIL PELOQUIN  is a 70 y.o.  male  with permanent atril fibrillation, hyperlipidemia with intolerance to statins, uncontrolled diabetes mellitus with stage 3 CKD, hypertension, morbid obesity with obstructive sleep apnea does not use CPAP regularly follows Dr. Brett Fairy, and severe 3 vessel CAD by coronary angiogram in Nov 2017 and recommended medical therapy due to diffuse disease.     His diabetes is now well controlled, no clinical evidence of heart failure, no leg edema, no JVD.  He is on appropriate medical therapy.  I do not have his recent lipids, he was prescribed Repatha however he could not afford it due to cost.  In view of severe coronary artery disease LDL goal <70.  He is willing to try statin, will Rx Crestor 20 mg daily.  Will obtain lipids in 2 months.  His diabetes is now well controlled with A1c less than 6.5%.  Overall I am pleased with his progress, I will see him back in 6 months.   Adrian Prows, MD, [facility] 04/07/2020, 3:51 PM Office: [phone] Pager:  [phone]

## 2020-04-08 ENCOUNTER — Other Ambulatory Visit: Payer: Self-pay | Admitting: Endocrinology

## 2020-05-11 LAB — LIPID PANEL
Cholesterol: 188 (ref 0–200)
HDL: 46 (ref 35–70)
LDL Cholesterol: 142
LDl/HDL Ratio: 2.7
Triglycerides: 96 (ref 40–160)

## 2020-05-11 LAB — TSH: TSH: 1.01 (ref 0.41–5.90)

## 2020-05-11 LAB — PSA: PSA: 1.05

## 2020-05-11 LAB — MICROALBUMIN, URINE: Microalb, Ur: 159

## 2020-05-17 ENCOUNTER — Encounter: Payer: Self-pay | Admitting: Endocrinology

## 2020-06-22 ENCOUNTER — Ambulatory Visit: Payer: Medicare Other | Admitting: Endocrinology

## 2020-07-12 ENCOUNTER — Ambulatory Visit: Payer: Medicare Other | Admitting: Endocrinology

## 2020-07-12 ENCOUNTER — Encounter: Payer: Self-pay | Admitting: Endocrinology

## 2020-07-12 ENCOUNTER — Other Ambulatory Visit: Payer: Self-pay

## 2020-07-12 VITALS — BP 142/86 | HR 45 | Ht 72.0 in | Wt 353.8 lb

## 2020-07-12 DIAGNOSIS — E1165 Type 2 diabetes mellitus with hyperglycemia: Secondary | ICD-10-CM

## 2020-07-12 LAB — POCT GLYCOSYLATED HEMOGLOBIN (HGB A1C): Hemoglobin A1C: 6.7 % — AB (ref 4.0–5.6)

## 2020-07-12 LAB — GLUCOSE, POCT (MANUAL RESULT ENTRY): POC Glucose: 124 mg/dl — AB (ref 70–99)

## 2020-07-12 MED ORDER — DAPAGLIFLOZIN PROPANEDIOL 5 MG PO TABS: 5.0000 mg | ORAL_TABLET | Freq: Every day | ORAL | 3 refills | Status: DC

## 2020-07-12 MED ORDER — FLUVASTATIN SODIUM ER 80 MG PO TB24: 80.0000 mg | ORAL_TABLET | Freq: Every day | ORAL | 1 refills | Status: DC

## 2020-07-12 NOTE — Progress Notes (Signed)
Patient ID: William Cameron, male   DOB: [birth year], 70 y.o.   MRN: [id number]           Reason for Appointment: Follow-up for Type 2 Diabetes  Referring PCP: Ralene Ok   History of Present Illness:          Date of diagnosis of type 2 diabetes mellitus:   Around year 2000      Background history:  Patient has very poor recall about his initial diagnosis and previous treatment Also no records available to review diabetes management and previous A1c results He does not know if he took metformin However in 2016 his A1c was 6% and he thinks that his blood sugars started going up only when he had staph infection in his foot in 7/19 when his A1c was 7.6   He apparently stopped [place] in 2020 because of high out-of-pocket expense  Previously had tried Trulicity but apparently had nausea with this and not clear when this was stopped   Recent history:      INSULIN regimen: Humalog mix 12 units in a.m. and before dinner: 22 units  Non-insulin hypoglycemic drugs the patient is taking are: None  His A1c is 6.7, previously 6.3  Current management, blood sugar patterns and problems identified:   He continues the same doses of premixed insulin started in 9/20  Although he had lost 20 pounds last year he has gone off his diet in the last few weeks and gained it all back  His sugars are relatively higher he thinks when he is eating sweets in the evening  Again with not checking blood sugars after his main meal in the evening not clear what his postprandial readings are  Despite reminders he has only blood sugars in the mornings occasionally  As before his blood sugars are overall averaging fairly good in the morning  Also no hypoglycemic symptoms during the day even though he frequently does not eat breakfast or lunch including today  Currently not exercising also  He does not think he is forgetting his insulin  As before he has difficulty affording brand-name medications,  previously on Farxiga                   Glucose monitoring:  Using One Touch Verio, blood sugars by download:   PRE-MEAL Fasting Lunch Dinner Bedtime Overall  Glucose range: 111-156      Mean/median:     128    FASTING range previously over 30 days = 103-176 with AVERAGE 126   Dietician visit, most recent: 12/20 CDE visit: 9/20  Weight history:  Wt Readings from Last 3 Encounters:  07/12/20 (!) 353 lb 12.8 oz (160.5 kg)  04/07/20 (!) 339 lb 3.2 oz (153.9 kg)  02/23/20 (!) 334 lb 12.8 oz (151.9 kg)    Glycemic control:   Lab Results  Component Value Date   HGBA1C 6.7 (A) 07/12/2020   HGBA1C 6.3 (A) 02/23/2020   HGBA1C 7.9 05/14/2019   Lab Results  Component Value Date   MICROALBUR 159 05/11/2020   LDLCALC 142 05/11/2020   CREATININE 1.4 (A) 05/14/2019   Lab Results  Component Value Date   MICRALBCREAT 8.5 02/23/2020    No results found for: FRUCTOSAMINE  Office Visit on 07/12/2020  Component Date Value Ref Range Status  . Hemoglobin A1C 07/12/2020 6.7* 4.0 - 5.6 % Final  . POC Glucose 07/12/2020 124* 70 - 99 mg/dl Final    Allergies as of 07/12/2020  Reactions   Penicillins Anaphylaxis   Has patient had a PCN reaction causing immediate rash, facial/tongue/throat swelling, SOB or lightheadedness with hypotension: Yes Has patient had a PCN reaction causing severe rash involving mucus membranes or skin necrosis: Yes Has patient had a PCN reaction that required hospitalization Yes Has patient had a PCN reaction occurring within the last 10 years: No If all of the above answers are "NO", then may proceed with Cephalosporin use.   Tetanus Toxoids Anaphylaxis, Shortness Of Breath, Swelling   Sulfa Antibiotics Nausea And Vomiting, Swelling   Trulicity [dulaglutide]    nausea      Medication List       Accurate as of July 12, 2020  2:45 PM. If you have any questions, ask your nurse or doctor.        STOP taking these medications   rosuvastatin 20 MG  tablet Commonly known as: CRESTOR Stopped by: Reather LittlerAjay Melvie Paglia, MD     TAKE these medications   amLODipine 5 MG tablet Commonly known as: NORVASC Take 5 mg by mouth daily.   apixaban 5 MG Tabs tablet Commonly known as: ELIQUIS Take 5 mg by mouth 2 (two) times daily.   benazepril 40 MG tablet Commonly known as: LOTENSIN Take 40 mg by mouth daily after supper.   dapagliflozin propanediol 5 MG Tabs tablet Commonly known as: Farxiga Take 1 tablet (5 mg total) by mouth daily. Started by: Reather LittlerAjay Regis Hinton, MD   fluvastatin XL 80 MG 24 hr tablet Commonly known as: LESCOL XL Take 1 tablet (80 mg total) by mouth daily. Started by: Reather LittlerAjay Canaan Prue, MD   Insulin Lispro Prot & Lispro (75-25) 100 UNIT/ML Kwikpen Commonly known as: HumaLOG Mix 75/25 KwikPen INJECT 12 UNITS SUBCUTANEOUSLY IN THE MORNING AND 22 UNITS IN THE EVENING   metoprolol succinate 25 MG 24 hr tablet Commonly known as: TOPROL-XL TAKE 1 TABLET BY MOUTH ONCE DAILY **TAKE  WITH  OR  IMMEDIATELY  FOLLOWING  A  MEAL   NovoFine Plus 32G X 4 MM Misc Generic drug: Insulin Pen Needle Use to inject insulin twice daily.   OneTouch Verio test strip Generic drug: glucose blood USE 1 STRIP TO CHECK GLUCOSE TWICE DAILY   vitamin B-12 500 MCG tablet Commonly known as: CYANOCOBALAMIN Take 2,500 mcg by mouth daily. Take 2500mcg by mouth once daily.       Allergies:  Allergies  Allergen Reactions  . Penicillins Anaphylaxis    Has patient had a PCN reaction causing immediate rash, facial/tongue/throat swelling, SOB or lightheadedness with hypotension: Yes Has patient had a PCN reaction causing severe rash involving mucus membranes or skin necrosis: Yes Has patient had a PCN reaction that required hospitalization Yes Has patient had a PCN reaction occurring within the last 10 years: No If all of the above answers are "NO", then may proceed with Cephalosporin use.  . Tetanus Toxoids Anaphylaxis, Shortness Of Breath and Swelling  . Sulfa  Antibiotics Nausea And Vomiting and Swelling  . Trulicity [Dulaglutide]     nausea    Past Medical History:  Diagnosis Date  . Atrial fibrillation (HCC)   . Bronchitis    chronic bronchitis  . COPD (chronic obstructive pulmonary disease) (HCC)   . Diabetes mellitus without complication (HCC)   . Elevated lactic acid level   . Hyperlipidemia   . Hypertension   . PONV (postoperative nausea and vomiting)    post op nausea  . Severe obesity (BMI >= 40) (HCC) 09/03/2013   patien tlost  55 pounds, from 450 pounds, arthritism neuropathy, diabetes , 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   [employer]   . Sleep apnea    cpap at night    Past Surgical History:  Procedure Laterality Date  . CARDIOVERSION N/A 01/25/2016   Procedure: CARDIOVERSION;  Surgeon: Yates Decamp, MD;  Location: [facility] ENDOSCOPY;  Service: Cardiovascular;  Laterality: N/A;  . COLONOSCOPY  02/02/2012   Procedure: COLONOSCOPY;  Surgeon: Theda Belfast, MD;  Location: [facility] ENDOSCOPY;  Service: Endoscopy;  Laterality: N/A;  . HERNIA REPAIR    . HYDROCELE EXCISION / REPAIR    . I & D EXTREMITY Left 11/24/2017   Procedure: IRRIGATION AND DEBRIDEMENT LEFT FOOT, REMOVAL OF TWO SCREWS, INSERTION OF ANTIBIOTIC BEADS;  Surgeon: Felecia Shelling, DPM;  Location: [facility] OR;  Service: Podiatry;  Laterality: Left;  . JOINT REPLACEMENT     rt knee  . ROTATOR CUFF REPAIR  rt  . TOTAL HIP ARTHROPLASTY Right 07/14/2014   Procedure: RIGHT TOTAL HIP ARTHROPLASTY;  Surgeon: Durene Romans, MD;  Location: [facility] ORS;  Service: Orthopedics;  Laterality: Right;    Family History  Problem Relation Age of Onset  . Diabetes type II Mother   . Prostate cancer Father 69    Social History:  reports that he has never smoked. He has never used smokeless tobacco. He reports that he does not drink alcohol and does not use drugs.   Review of Systems   Lipid history: He had muscle and joint pains with Lipitor and also with Crestor prescribed by PCP which he could  not tolerate apparently  He had been on Repatha although he ran out of his prescription    Lab Results  Component Value Date   CHOL 188 05/11/2020   HDL 46 05/11/2020   LDLCALC 142 05/11/2020   TRIG 96 05/11/2020   CHOLHDL 5 12/31/2018           Hypertension: Has been on treatment, treatment includes benazepril 40 mg and amlodipine followed by PCP  BP Readings from Last 3 Encounters:  07/12/20 (!) 142/86  04/07/20 140/84  02/23/20 138/84   Has had mild renal dysfunction, creatinine was normal in 2/22 from PCP  Lab Results  Component Value Date   CREATININE 1.4 (A) 05/14/2019   CREATININE 1.45 12/31/2018   CREATININE 1.44 (H) 11/27/2017    Most recent eye exam was in 2022, no report available from Dr. Harriette Bouillon  Most recent foot exam: 4/22, also done by PCP  Currently known complications of diabetes: Mild neuropathy    LABS:  Office Visit on 07/12/2020  Component Date Value Ref Range Status  . Hemoglobin A1C 07/12/2020 6.7* 4.0 - 5.6 % Final  . POC Glucose 07/12/2020 124* 70 - 99 mg/dl Final    Physical Examination:  BP (!) 142/86   Pulse (!) 45   Ht 6' (1.829 m)   Wt (!) 353 lb 12.8 oz (160.5 kg)   SpO2 96%   BMI 47.98 kg/m       Diabetic Foot Exam - Simple   Simple Foot Form Visual Inspection See comments: Yes Sensation Testing Intact to touch and monofilament testing bilaterally: Yes Pulse Check Posterior Tibialis and Dorsalis pulse intact bilaterally: Yes Comments Scar of surgery on the left foot medial to the first toe.          ASSESSMENT:  Diabetes type 2 with obesity  See history of present illness for detailed discussion of current diabetes management, blood sugar patterns  and problems identified  His A1c is 6.7  He appears to have relatively mild diabetes with taking only small doses of premixed insulin  Despite his weight gain his blood sugars are not much higher Blood sugar monitoring is inadequate only few morning  readings Also recently diet has been poor He does need significant weight loss as well as diabetes management to include a risk reduction strategy  Hyperlipidemia: He has a history of coronary disease but not on any  Renal dysfunction: Improved, also has  Hypertension: Fair control unable to continue to follow-up with PCP   PLAN:     Discussed need for more postprandial glucose monitoring Discussed action of SGLT 2 drugs on lowering glucose by decreasing kidney absorption of glucose, benefits of weight loss and lower blood pressure, possible side effects including candidiasis and dosage regimen   He will try Farxiga 5 mg daily  Also benefits of long-term cardiovascular risk reduction discussed  He will be given 30-day free trial along with information on patient assistance program from [employer]  Trial of LESCOL XL for hypercholesterolemia and he can at least take this 2 or 3 days a week and more if tolerated  He will start watching his diet and cut back on any high fat and high glycemic index foods and snacks  Walk regularly  Consider follow-up with nutritionist on the next visit  Follow-up in 3 months  Patient Instructions  Fluvastatin every 2-3 days and after 2 weeks daily if able for cholesterol           Reather Littler 07/12/2020, 2:45 PM   Note: This office note was prepared with Dragon voice recognition system technology. Any transcriptional errors that result from this process are unintentional.

## 2020-07-12 NOTE — Patient Instructions (Signed)
Fluvastatin every 2-3 days and after 2 weeks daily if able for cholesterol

## 2020-07-22 IMAGING — DX DG FOOT COMPLETE 3+V*L*
2 series · 3 of 3 positions shown · non-contrast
Comparison: 11/12/2017

CLINICAL DATA: Postop

EXAM:
LEFT FOOT - COMPLETE 3+ VIEW

[Series 1: foot · 0.14mm/px · 2 of 2 slices shown]
[im 1/2]
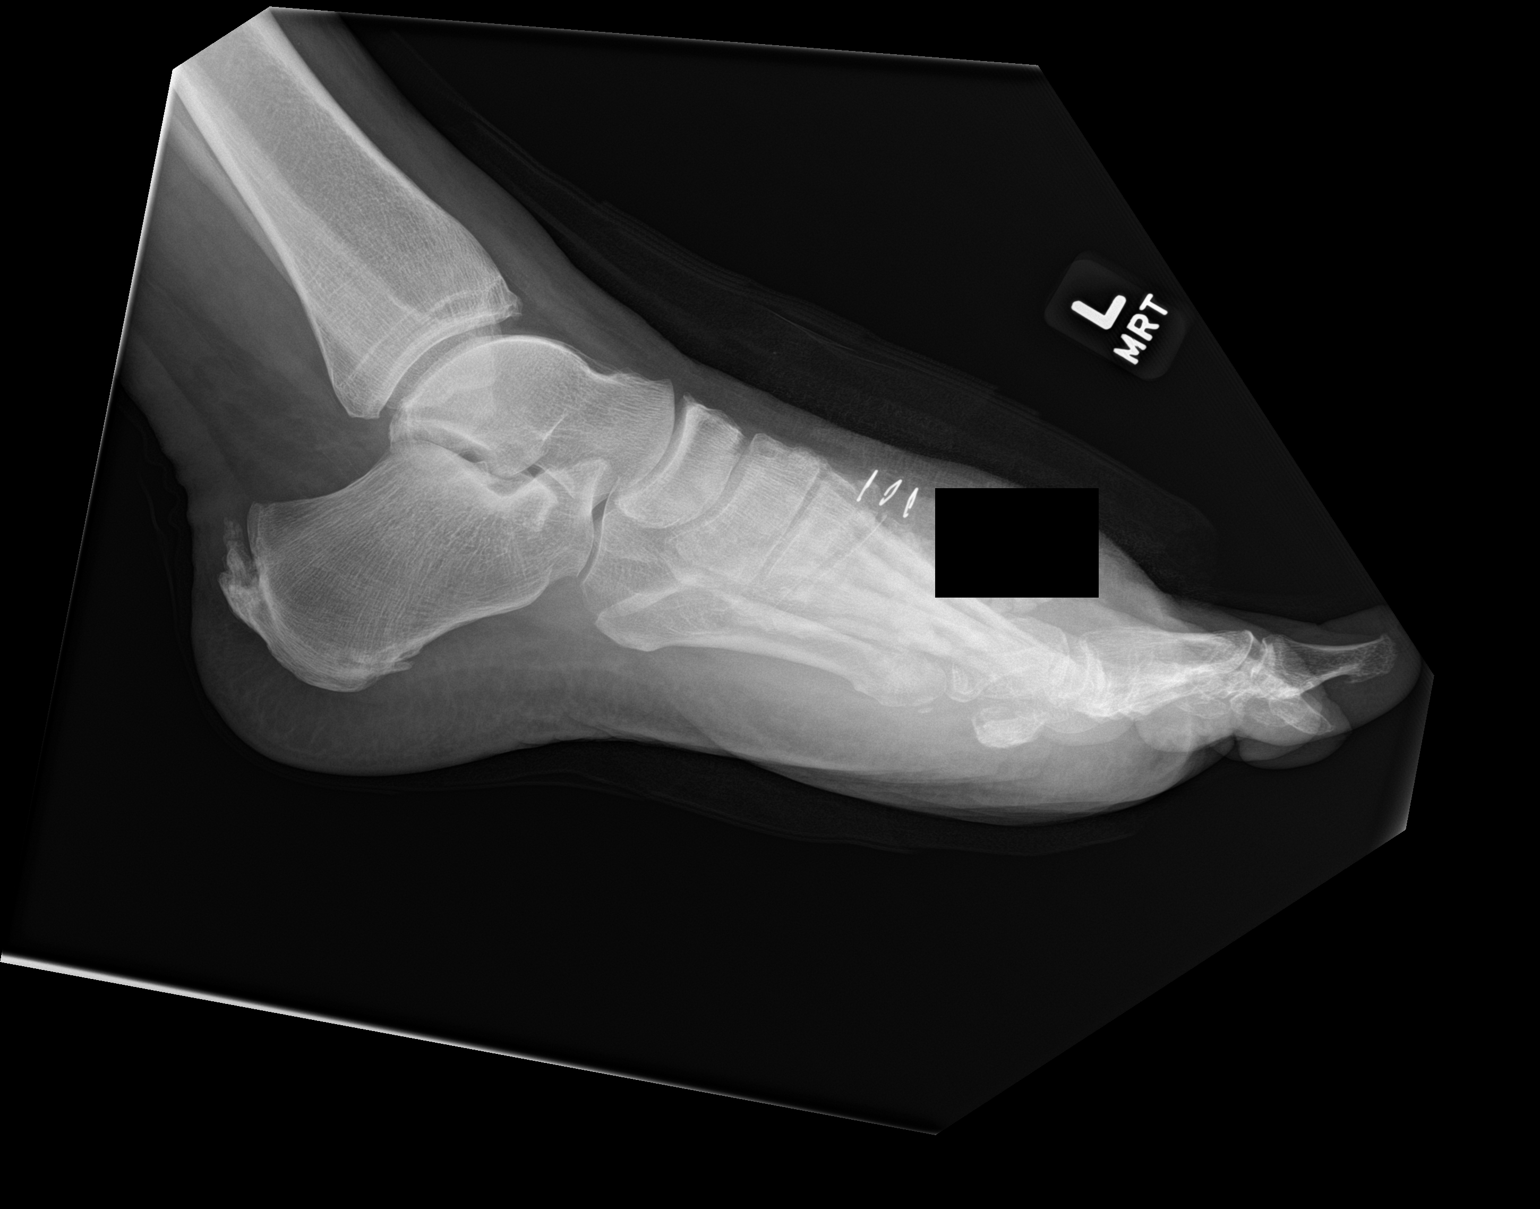
[im 2/2]
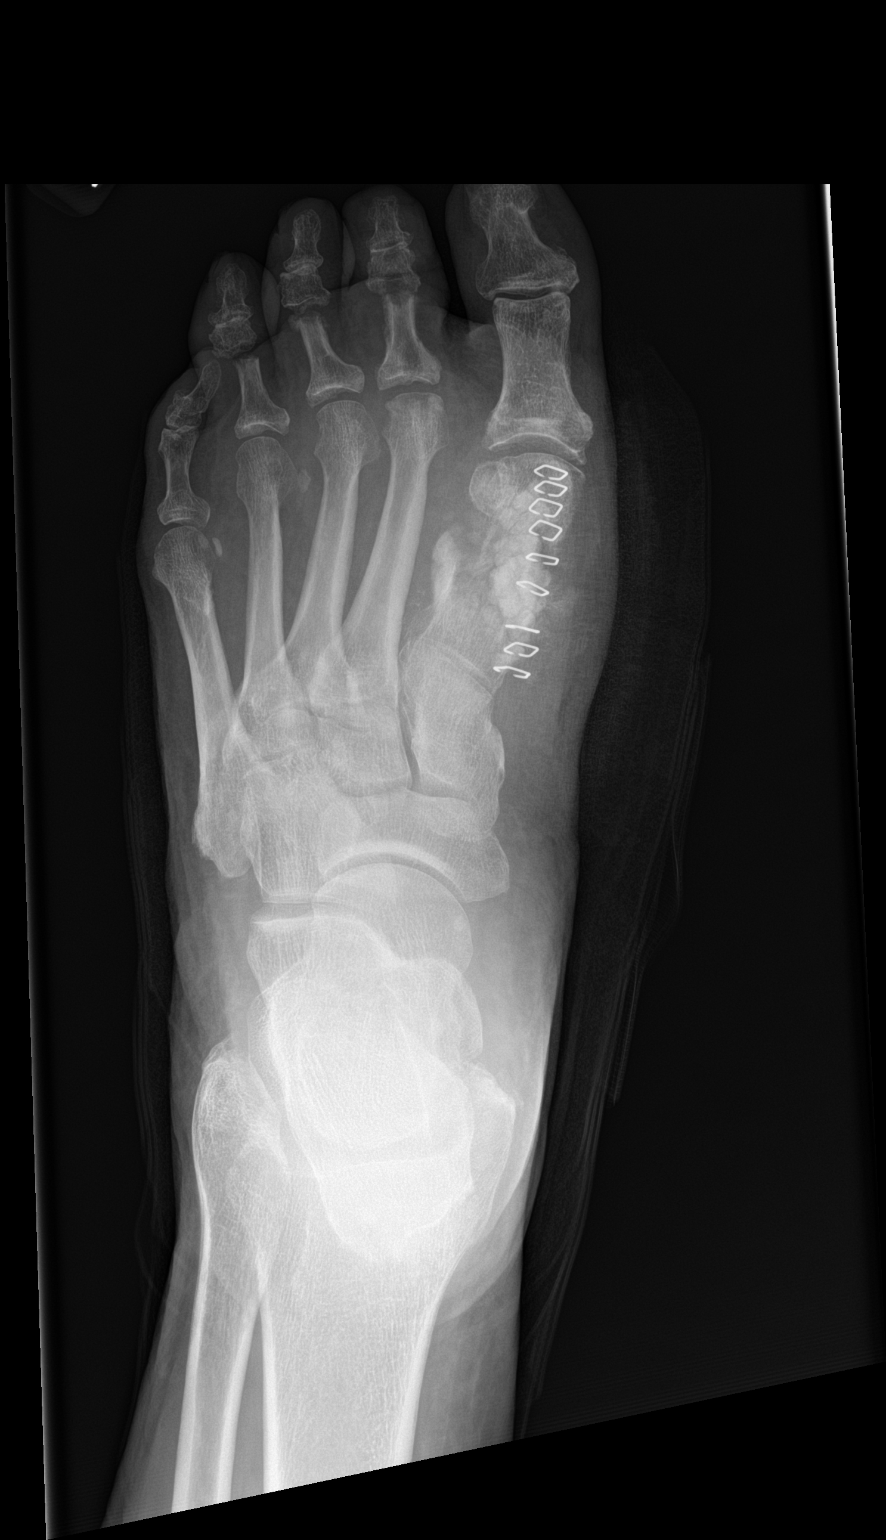

[leg]
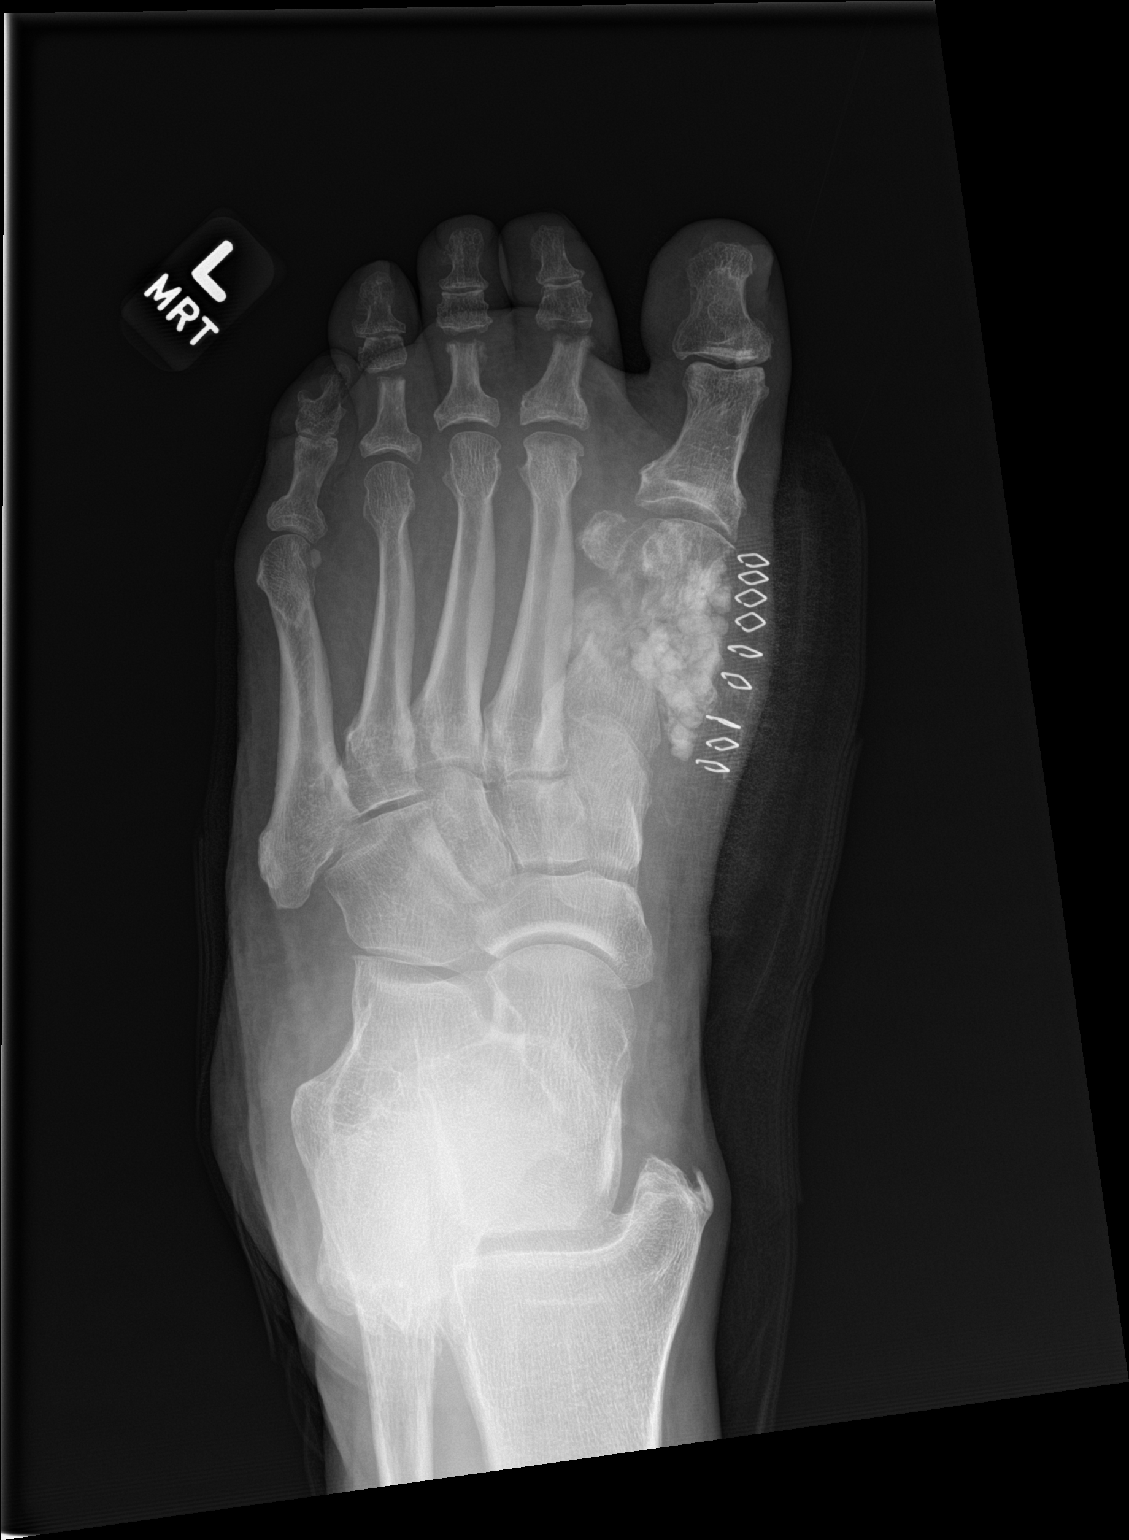

[3 of 3 positions shown; findings below may reference images not displayed]

FINDINGS: Two screws in the first metatarsal have been removed. Antibiotic
beads are present. There is bone destruction and apparent
osteomyelitis of the first metatarsal.

Osteotomy of the second, third, and fourth proximal phalanges for
prior hammertoe repair.
IMPRESSION: Removal of hardware in the first metatarsal with antibiotic bead
placement. Bony defect related to infection.

## 2020-07-23 ENCOUNTER — Other Ambulatory Visit: Payer: Self-pay | Admitting: Endocrinology

## 2020-07-23 IMAGING — DX DG CHEST 1V PORT
1 series · 1 of 1 positions shown · non-contrast
Comparison: Chest x-ray 11/25/2017.

CLINICAL DATA: 67-year-old male status post PICC placement.

EXAM:
PORTABLE CHEST 1 VIEW

[chest]
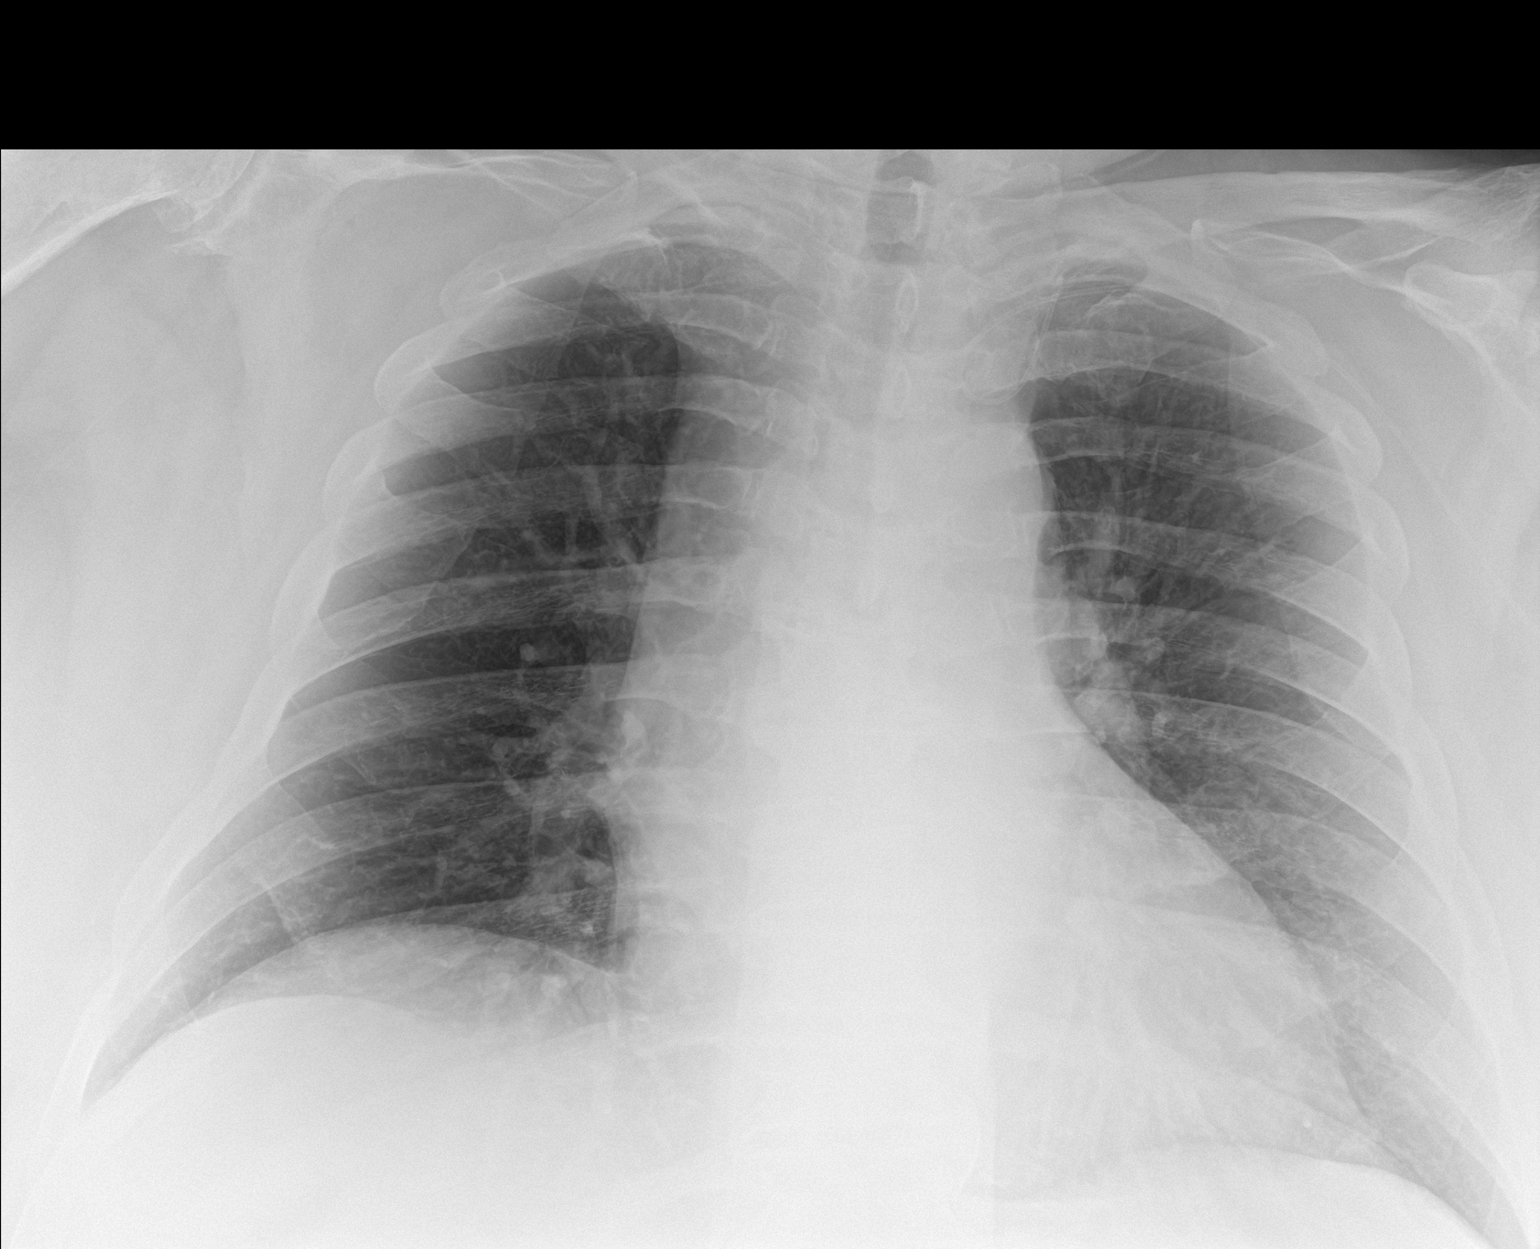

[1 of 1 positions shown; findings below may reference images not displayed]

FINDINGS: Previously noted right-sided PICC is not identified on today's
examination, and has presumably been withdrawn. Lung volumes are
normal. No consolidative airspace disease. No pleural effusions. No
pneumothorax. No pulmonary nodule or mass noted. Pulmonary
vasculature and the cardiomediastinal silhouette are within normal
limits.
IMPRESSION: 1. No radiographic evidence of acute cardiopulmonary disease.
2. Withdrawal of previously noted right-sided PICC.

## 2020-07-23 IMAGING — DX DG CHEST 1V PORT
1 series · 1 of 1 positions shown · non-contrast
Comparison: None.

CLINICAL DATA: PICC line placement

EXAM:
PORTABLE CHEST 1 VIEW

[chest ap]
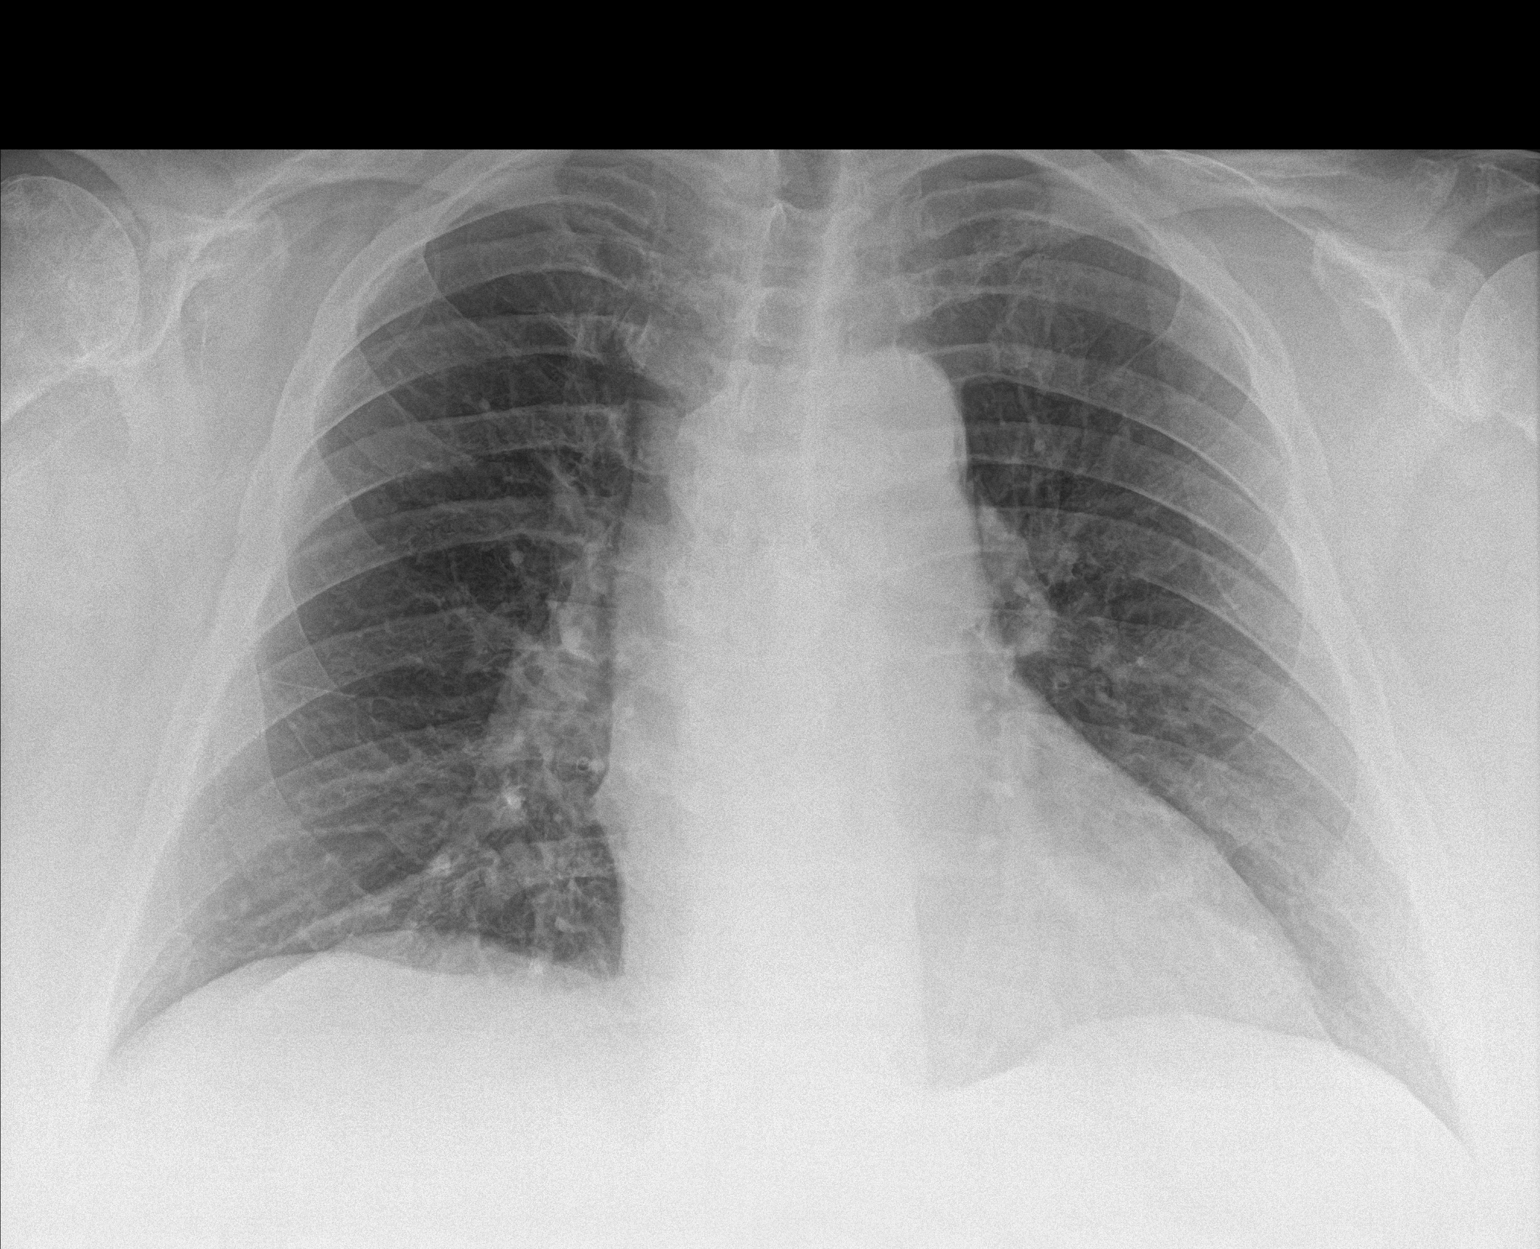

[1 of 1 positions shown; findings below may reference images not displayed]

FINDINGS: PICC line from a RIGHT upper arm access enters the RIGHT subclavian
vein region but loops back into the RIGHT axilla

Lungs are clear.  No pulmonary edema or pneumothorax.
IMPRESSION: Malpositioned PICC line.

PICC line enters the RIGHT subclavian region and loops back into the
RIGHT axilla.

These results will be called to the ordering clinician or
representative by the Radiologist Assistant, and communication
documented in the PACS or zVision Dashboard.

## 2020-09-12 ENCOUNTER — Other Ambulatory Visit: Payer: Self-pay | Admitting: Endocrinology

## 2020-10-07 ENCOUNTER — Other Ambulatory Visit: Payer: Self-pay

## 2020-10-07 ENCOUNTER — Ambulatory Visit: Payer: Medicare Other | Admitting: Cardiology

## 2020-10-07 ENCOUNTER — Encounter: Payer: Self-pay | Admitting: Cardiology

## 2020-10-07 VITALS — BP 142/91 | HR 62 | Temp 98.0°F | Resp 17 | Ht 72.0 in | Wt 353.8 lb

## 2020-10-07 DIAGNOSIS — N1832 Chronic kidney disease, stage 3b: Secondary | ICD-10-CM

## 2020-10-07 DIAGNOSIS — E782 Mixed hyperlipidemia: Secondary | ICD-10-CM

## 2020-10-07 DIAGNOSIS — I10 Essential (primary) hypertension: Secondary | ICD-10-CM

## 2020-10-07 DIAGNOSIS — I25118 Atherosclerotic heart disease of native coronary artery with other forms of angina pectoris: Secondary | ICD-10-CM

## 2020-10-07 DIAGNOSIS — I5032 Chronic diastolic (congestive) heart failure: Secondary | ICD-10-CM

## 2020-10-07 DIAGNOSIS — I4821 Permanent atrial fibrillation: Secondary | ICD-10-CM

## 2020-10-07 NOTE — Progress Notes (Addendum)
Primary Physician/Referring:  Ralene Ok, MD  Patient ID: William Cameron, male    DOB: [birth year], 70 y.o.   MRN: [id number]  Chief Complaint  Patient presents with   Follow-up    6 month   Coronary Artery Disease   Hypertension   hld    HPI: William Cameron  is a 70 y.o. male  with permanent atril fibrillation, hyperlipidemia with intolerance to statins, uncontrolled diabetes mellitus with stage 3 CKD, hypertension, morbid obesity with obstructive sleep apnea does not use CPAP regularly follows Dr. Vickey Huger, and severe 3 vessel CAD by coronary angiogram in Nov 2017 and recommended medical therapy due to diffuse disease.  He had Covid pneumonia in Nov 2021.   He presents here for here for an 27-month visit of CAD. He is presently doing well, states that dyspnea is remained stable.  He has no specific complaints today.   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    Shakes    Sleep apnea    cpap at night    Past Surgical History:  Procedure Laterality Date   CARDIOVERSION N/A 01/25/2016   Procedure: CARDIOVERSION;  Surgeon: Yates Decamp, MD;  Location: [facility] ENDOSCOPY;  Service: Cardiovascular;  Laterality: N/A;   COLONOSCOPY  02/02/2012   Procedure: COLONOSCOPY;  Surgeon: Theda Belfast, MD;  Location: [facility] ENDOSCOPY;  Service: Endoscopy;  Laterality: N/A;   HERNIA REPAIR     HYDROCELE EXCISION / REPAIR     I & D EXTREMITY Left 11/24/2017   Procedure: IRRIGATION AND DEBRIDEMENT LEFT FOOT, REMOVAL OF TWO SCREWS, INSERTION OF ANTIBIOTIC BEADS;   Surgeon: Felecia Shelling, DPM;  Location: [facility] OR;  Service: Podiatry;  Laterality: Left;   JOINT REPLACEMENT     rt knee   ROTATOR CUFF REPAIR  rt   TOTAL HIP ARTHROPLASTY Right 07/14/2014   Procedure: RIGHT TOTAL HIP ARTHROPLASTY;  Surgeon: Durene Romans, MD;  Location: [facility] ORS;  Service: Orthopedics;  Laterality: Right;   Social History   Tobacco Use   Smoking status: Never   Smokeless tobacco: Never  Substance Use Topics   Alcohol use: No  marital Status: Widowed    Current Outpatient Medications on File Prior to Visit  Medication Sig Dispense Refill   amLODipine (NORVASC) 5 MG tablet Take 5 mg by mouth daily.     apixaban (ELIQUIS) 5 MG TABS tablet Take 5 mg by mouth 2 (two) times daily.     benazepril (LOTENSIN) 40 MG tablet Take 40 mg by mouth daily after supper.      Insulin Lispro Prot & Lispro (HUMALOG MIX 75/25 KWIKPEN) (75-25) 100 UNIT/ML Kwikpen INJECT 12 UNITS SUBCUTANEOUSLY IN THE MORNING AND 22 UNITS IN THE EVENING 15 mL 3   Insulin Pen Needle (NOVOFINE PLUS) 32G X 4 MM MISC Use to inject insulin twice daily. 100 each 2   metoprolol succinate (TOPROL-XL) 25 MG 24 hr tablet TAKE 1 TABLET BY MOUTH ONCE DAILY **TAKE  WITH  OR  IMMEDIATELY  FOLLOWING  A  MEAL 90 tablet 0   ONETOUCH VERIO test strip USE 1 STRIP TO CHECK GLUCOSE TWICE DAILY 100 each 2   No current facility-administered medications on file prior to visit.   Review of Systems  Constitutional: Negative for malaise/fatigue.  Cardiovascular:  Positive for dyspnea on exertion (Stable). Negative for leg swelling and syncope.  Respiratory:  Positive for sleep disturbances due to breathing (has OSA but not on CPAP). Negative for cough and sputum production.   Hematologic/Lymphatic: Does not bruise/bleed easily.  Skin:  Positive for color change (from leg infection).  Musculoskeletal:  Positive for arthritis (diffuse), back pain and joint pain. Negative for joint swelling.  Neurological:  Negative for headaches and  light-headedness.  Psychiatric/Behavioral:  Negative for depression and substance abuse. The patient does not have insomnia and is not nervous/anxious.   All other systems reviewed and are negative. Objective  Blood pressure (!) 142/91, pulse 62, temperature 98 F (36.7 C), temperature source Temporal, resp. rate 17, height 6' (1.829 m), weight (!) 353 lb 12.8 oz (160.5 kg), SpO2 94 %. Body mass index is 47.98 kg/m.   Vitals with BMI 10/07/2020 10/07/2020 07/12/2020  Height - 6\' 0"  -  Weight - 353 lbs 13 oz -  BMI - 47.97 -  Systolic 142 149 [id number]  Diastolic 91 89 86  Pulse 62 58 -     Physical Exam Constitutional:      General: He is not in acute distress.    Appearance: He is well-developed.     Comments: Morbidly obese  HENT:     Head: Atraumatic.  Neck:     Vascular: No carotid bruit or JVD.     Comments: Short neck and difficult to evaluate JVP Cardiovascular:     Rate and Rhythm: Normal rate. Rhythm irregular.     Pulses:          Carotid pulses are 2+ on the right side and 2+ on the left side.      Dorsalis pedis pulses are 2+ on the right side and 2+ on the left side.       Posterior tibial pulses are 2+ on the right side and 2+ on the left side.     Heart sounds: Normal heart sounds. No murmur heard.   No gallop.     Comments: Femoral and popliteal pulse difficult to feel due to patient's body habitus.  Pulmonary:     Effort: Pulmonary effort is normal.     Breath sounds: Normal breath sounds.  Abdominal:     General: Bowel sounds are normal.     Palpations: Abdomen is soft.     Comments: Obese. Pannus present  Musculoskeletal:        General: No swelling.   Radiology: No results found.  Laboratory examination:    CMP Latest Ref Rng & Units 10/07/2020 05/14/2019 12/31/2018  Glucose 65 - 99 mg/dL 096(E102(H) - 454(U175(H)  BUN 8 - 27 mg/dL 14 21 21   Creatinine 0.76 - 1.27 mg/dL 9.81(X1.33(H) 9.1(Y1.4(A) 7.821.45  Sodium 134 - 144 mmol/L 141 136(A) 133(L)  Potassium 3.5 - 5.2 mmol/L 4.5  4.8 4.8  Chloride 96 - 106 mmol/L 98 101 99  CO2 20 - 29 mmol/L 21 26(A) 26  Calcium 8.6 - 10.2 mg/dL 95.610.1 21.310.0 10.8(H)  Total Protein 6.0 - 8.3 g/dL - - 8.2  Total Bilirubin 0.2 - 1.2 mg/dL - - 0.6  Alkaline Phos 25 - 125 - 58 53  AST 14 -  40 - 21 45(H)  ALT 10 - 40 - 17 48   CBC Latest Ref Rng & Units 11/27/2017 11/26/2017 11/25/2017  WBC 4.0 - 10.5 K/uL 10.1 7.4 9.5  Hemoglobin 13.0 - 17.0 g/dL 28.3 12.4(L) 12.4(L)  Hematocrit 39.0 - 52.0 % 43.6 39.9 39.5  Platelets 150 - 400 K/uL 169 165 199   Lipid Panel Recent Labs    05/11/20 0000 10/20/20 1406  CHOL 188 226*  TRIG 96 128  LDLCALC 142 159*  HDL 46 44  CHOLHDL  --  5.1*     HEMOGLOBIN A1C Lab Results  Component Value Date   HGBA1C 6.7 (A) 07/12/2020   MPG 171.42 10/08/2017   TSH Recent Labs    05/11/20 0000  TSH 1.01    BNP (last 3 results) Recent Labs    10/07/20 1509  BNP 107.9*    Cardiac Studies:   Direct current cardioversion 01/25/2016: 120 x1, 150x1 to NSR.  Exercise sestamibi stress test 12/27/2015: 1. Two day protocol followed. The resting electrocardiogram demonstrated normal sinus rhythm, incomplete RBBB and no resting arrhythmias.  Low voltage and  poor R progression. The stress electrocardiogram was normal. Patient exercised on Bruce protocol for 4:13 minutes and achieved  4.84 METS. Stress test terminated due to % MPHR achieved (Target HR >85%). Symptoms included dyspnea. 2. The perfusion imaging study demonstrates diaphragmatic attenuation artifact in the inferior wall. There is no demonstrable ischemia or scar. LV systolic function was normal at 64%. This is a low risk study. 01/24/2016: Impression: EKG 11/13/2017: Atrial fibrillation with controlled ventricular response at the rate of 66 bpm, leftward axis, poor R-wave progression, cannot exclude anterolateral infarct old. Low-voltage complexes. Nonspecific T abnormality. No significant change from EKG 11/15/2016.  Coronary angiogram  01/24/2016: Mid LAD occluded, D2 small to moderate sized proximal 80%. Mid Cx occluded with moderate OM1 which bifurcates. Both calcific CTO. RCA PL 1 has 80% stenosis (small) and PL3 occluded, mod-large vessel. LVEF 50-55% with apical akinesis.   Echocardiogram  05/13/2018:  Left ventricle cavity is normal in size. Normal global wall motion. Unable to evaluate diastolic function due to A. Fibrillation. Calculated EF 59%. Left atrial cavity is severely dilated at 5.4 cm. Right ventricle cavity is mild to moderately dilated. Mildly reduced right ventricular function. A moderateor band is noted at the RV apex (normal variant). Trace mitral regurgitation. Mild calcification of the mitral valve annulus. Mild mitral valve leaflet thickening. Trace tricuspid regurgitation. Unable to estimate PA pressure due to absence/minimal TR signal. IVC is dilated with poor inspiration collapse consistent with elevated right atrial pressure. Compared to the study done on 01/04/2016, no significant change.  EKG:  EKG 10/07/2020: Atrial fibrillation with controlled ventricular response at the rate of 64 bpm, left axis deviation, left anterior fascicular block.  Incomplete right bundle branch block.  Anterolateral infarct old.  Nonspecific T abnormality.  Low-voltage complexes.  No change from 04/07/2020.    EKG 04/09/2018: Sinus arrest with junctional escape rhythm at the rate of 54 bpm, left axis deviation, left anterior fascicular block.  Incomplete right bundle branch block.  Anterolateral infarct old.  Low-voltage complexes.  Nonspecific T abnormality.   Assessment     ICD-10-CM   1. Coronary artery disease of native artery of native heart with stable angina pectoris (HCC)  I25.118 PCV ECHOCARDIOGRAM COMPLETE    Evolocumab (REPATHA) 140 MG/ML SOSY    2. Chronic diastolic (congestive) heart failure (HCC)  I50.32 Brain natriuretic peptide    PCV ECHOCARDIOGRAM COMPLETE  3. Permanent atrial fibrillation (HCC)  CHA2DS2-VASc Score is 4. Yearly risk of stroke: 4.  I48.21     4. Mixed hyperlipidemia  E78.2 Evolocumab (REPATHA) 140 MG/ML SOSY    5. Essential hypertension  I10 EKG 12-Lead    Basic metabolic panel    6. Stage 3b chronic kidney disease (HCC)  N18.32       CHA2DS2-VASc Score is 4.  Yearly risk of stroke: 4.8% (A, HTN, DM, Vasc Dz).  Score of 1=0.6; 2=2.2; 3=3.2; 4=4.8; 5=7.2; 6=9.8; 7=>9.8) -(CHF; HTN; vasc disease DM,  Male = 1; Age <65 =0; 65-74 = 1,  >75 =2; stroke/embolism= 2).    Meds ordered this encounter  Medications   Evolocumab (REPATHA) 140 MG/ML SOSY    Sig: Inject 1 pen into the skin every 14 (fourteen) days.    Dispense:  4.2 mL    Refill:  3     Medications Discontinued During This Encounter  Medication Reason   vitamin B-12 (CYANOCOBALAMIN) 500 MCG tablet Error   dapagliflozin propanediol (FARXIGA) 5 MG TABS tablet Error   fluvastatin XL (LESCOL XL) 80 MG 24 hr tablet Error     Orders Placed This Encounter  Procedures   Basic metabolic panel   Brain natriuretic peptide   EKG 12-Lead   PCV ECHOCARDIOGRAM COMPLETE    Standing Status:   Future    Standing Expiration Date:   10/07/2021    Recommendations:   William Cameron  is a 70 y.o.  male  with permanent atril fibrillation, hyperlipidemia with intolerance to statins, uncontrolled diabetes mellitus with stage 3 CKD, hypertension, morbid obesity with obstructive sleep apnea does not use CPAP regularly follows Dr. Vickey Huger, and severe 3 vessel CAD by coronary angiogram in Nov 2017 and recommended medical therapy due to diffuse disease.    This is 57-month office visit, states that he is feeling well, he is now recuperated from grieving for his wife after she passed a minute malignancy.  He started to become more physically active. His diabetes is now well controlled, no clinical evidence of heart failure, no leg edema, no JVD.  He was previously on a statin but has discontinued this.  His LDL is now not at goal.   I realize this after he was discharged from the office, I called him up and discussed with the patient.  He is intolerant to statins, I will try Repatha, he is willing to try this.  I would like to see him back in 6 weeks for follow-up of specifically hypertension which is uncontrolled and also follow-up on his lipids and will try to get prior authorization for Repatha and also patient assistance if needed.  Patient states that he lives out of Social Security check.  With regard to atrial fibrillation, heart rate remains well controlled.  With regard to chronic diastolic heart failure, he has not had any acute decompensated heart failure.  I will obtain a baseline BNP and also repeat his echocardiogram to reevaluate his LV systolic function.  From coronary artery disease standpoint he has not had any recurrence of angina pectoris as well.  Patient will be screened into LUX-Sx TRENDS study (loop implantation for heart failure sensor).  I evaluated his labs, BNP is only minimally elevated and is within normal limits for a patient with atrial fibrillation and hence does not qualify.    Yates Decamp, MD, [facility] 10/21/2020, 6:29 AM Office: [phone] Pager: [phone]

## 2020-10-08 LAB — BASIC METABOLIC PANEL
BUN/Creatinine Ratio: 11 (ref 10–24)
BUN: 14 mg/dL (ref 8–27)
CO2: 21 mmol/L (ref 20–29)
Calcium: 10.1 mg/dL (ref 8.6–10.2)
Chloride: 98 mmol/L (ref 96–106)
Creatinine, Ser: 1.33 mg/dL — ABNORMAL HIGH (ref 0.76–1.27)
Glucose: 102 mg/dL — ABNORMAL HIGH (ref 65–99)
Potassium: 4.5 mmol/L (ref 3.5–5.2)
Sodium: 141 mmol/L (ref 134–144)
eGFR: 58 mL/min/{1.73_m2} — ABNORMAL LOW (ref >59)

## 2020-10-08 LAB — BRAIN NATRIURETIC PEPTIDE: BNP: 107.9 pg/mL — ABNORMAL HIGH (ref 0.0–100.0)

## 2020-10-13 MED ORDER — REPATHA 140 MG/ML ~~LOC~~ SOSY: 1.0000 "pen " | PREFILLED_SYRINGE | SUBCUTANEOUS | 3 refills | Status: DC

## 2020-10-13 NOTE — Addendum Note (Signed)
Addended by: Delrae Rend on: 10/13/2020 05:47 PM   Modules accepted: Orders

## 2020-10-19 ENCOUNTER — Other Ambulatory Visit: Payer: Self-pay

## 2020-10-19 DIAGNOSIS — E782 Mixed hyperlipidemia: Secondary | ICD-10-CM

## 2020-10-21 ENCOUNTER — Ambulatory Visit: Payer: Medicare Other | Admitting: Endocrinology

## 2020-10-21 ENCOUNTER — Other Ambulatory Visit: Payer: Self-pay | Admitting: Cardiology

## 2020-10-21 DIAGNOSIS — E782 Mixed hyperlipidemia: Secondary | ICD-10-CM

## 2020-10-21 LAB — LIPID PANEL
Chol/HDL Ratio: 5.1 ratio — ABNORMAL HIGH (ref 0.0–5.0)
Cholesterol, Total: 226 mg/dL — ABNORMAL HIGH (ref 100–199)
HDL: 44 mg/dL (ref >39)
LDL Chol Calc (NIH): 159 mg/dL — ABNORMAL HIGH (ref 0–99)
Triglycerides: 128 mg/dL (ref 0–149)
VLDL Cholesterol Cal: 23 mg/dL (ref 5–40)

## 2020-10-21 NOTE — Progress Notes (Signed)
Called and spoke to pt, he has not picked up his rapatha yet. We will try again to get a prior authorization.

## 2020-10-21 NOTE — Progress Notes (Signed)
Please call and enquire about Repatha. He needs repeat labs approximately in 1 month to 6 weeks and orders placed prior to his visit.  Messaged him on My Chart Hope you were able to get repatha at reduced cost. If you not picked up the Rx let us know so we can start prior authorization and also start therapy at our office.   JG

## 2020-10-27 ENCOUNTER — Ambulatory Visit (INDEPENDENT_AMBULATORY_CARE_PROVIDER_SITE_OTHER): Payer: Medicare Other | Admitting: Endocrinology

## 2020-10-27 ENCOUNTER — Other Ambulatory Visit: Payer: Self-pay

## 2020-10-27 ENCOUNTER — Encounter: Payer: Self-pay | Admitting: Endocrinology

## 2020-10-27 VITALS — BP 136/78 | HR 53 | Ht 72.0 in | Wt 347.4 lb

## 2020-10-27 DIAGNOSIS — Z794 Long term (current) use of insulin: Secondary | ICD-10-CM | POA: Diagnosis not present

## 2020-10-27 DIAGNOSIS — E782 Mixed hyperlipidemia: Secondary | ICD-10-CM | POA: Diagnosis not present

## 2020-10-27 DIAGNOSIS — E1165 Type 2 diabetes mellitus with hyperglycemia: Secondary | ICD-10-CM | POA: Diagnosis not present

## 2020-10-27 LAB — POCT GLYCOSYLATED HEMOGLOBIN (HGB A1C): Hemoglobin A1C: 6.8 % — AB (ref 4.0–5.6)

## 2020-10-27 NOTE — Progress Notes (Signed)
Patient ID: William Cameron, male   DOB: 08-Mar-1951, 70 y.o.   MRN: [id number]           Reason for Appointment: Follow-up   Referring PCP: Ralene Ok   History of Present Illness:          Date of diagnosis of type 2 diabetes mellitus:   Around year 2000      Background history:  Patient has very poor recall about his initial diagnosis and previous treatment Also no records available to review diabetes management and previous A1c results He does not know if he took metformin However in 2016 his A1c was 6% and he thinks that his blood sugars started going up only when he had staph infection in his foot in 7/19 when his A1c was 7.6  He apparently stopped [place] in 2020 because of high out-of-pocket expense Previously had tried Trulicity but apparently had nausea with this and not clear when this was stopped   Recent history:      INSULIN regimen: Humalog mix 12 units in a.m. and before dinner: 22 units  Non-insulin hypoglycemic drugs the patient is taking are: None  His A1c is 6.8, was 6.7, previously 6.3  Current management, blood sugar patterns and problems identified:  He was recommended adding Farxiga for cardiovascular benefits and weight loss  He says that he was trying to use some previous leftover [place] and he did not like the side effects which he thinks included shortness of breath; however he still continues to have some shortness of breath on exertion  He refuses to try this again  However on his own he started cutting back on regular soft drinks and sweetened drinks as well as meats  He is also trying to be a little more active  Appears to have lost about 6 pounds recently  Most of his blood sugar monitoring is only in the morning and not clear if he has any high postprandial hyperglycemia  No hypoglycemic symptoms also during the day  Lab glucose last month was 102                   Glucose monitoring:  Using One Touch Verio, blood sugars by  download:   PRE-MEAL Fasting Lunch Dinner Bedtime Overall  Glucose range: 92-145  101  84-145  Mean/median: 116    113   POST-MEAL PC Breakfast PC Lunch PC Dinner  Glucose range:   ?  Mean/median:      Previously:  PRE-MEAL Fasting Lunch Dinner Bedtime Overall  Glucose range: 111-156      Mean/median:     128      Dietician visit, most recent: 12/20 CDE visit: 9/20  Weight history:  Wt Readings from Last 3 Encounters:  10/27/20 (!) 347 lb 6.4 oz (157.6 kg)  10/07/20 (!) 353 lb 12.8 oz (160.5 kg)  07/12/20 (!) 353 lb 12.8 oz (160.5 kg)    Glycemic control:   Lab Results  Component Value Date   HGBA1C 6.8 (A) 10/27/2020   HGBA1C 6.7 (A) 07/12/2020   HGBA1C 6.3 (A) 02/23/2020   Lab Results  Component Value Date   MICROALBUR 159 05/11/2020   LDLCALC 159 (H) 10/20/2020   CREATININE 1.33 (H) 10/07/2020   Lab Results  Component Value Date   MICRALBCREAT 8.5 02/23/2020    No results found for: FRUCTOSAMINE  Office Visit on 10/27/2020  Component Date Value Ref Range Status   Hemoglobin A1C 10/27/2020 6.8 (A) 4.0 - 5.6 %  Final    Allergies as of 10/27/2020       Reactions   Penicillins Anaphylaxis   Has patient had a PCN reaction causing immediate rash, facial/tongue/throat swelling, SOB or lightheadedness with hypotension: Yes Has patient had a PCN reaction causing severe rash involving mucus membranes or skin necrosis: Yes Has patient had a PCN reaction that required hospitalization Yes Has patient had a PCN reaction occurring within the last 10 years: No If all of the above answers are "NO", then may proceed with Cephalosporin use.   Tetanus Toxoids Anaphylaxis, Shortness Of Breath, Swelling   Sulfa Antibiotics Nausea And Vomiting, Swelling   Statins    Trulicity [dulaglutide]    nausea        Medication List        Accurate as of October 27, 2020 11:59 PM. If you have any questions, ask your nurse or doctor.          amLODipine 5 MG  tablet Commonly known as: NORVASC Take 5 mg by mouth daily.   apixaban 5 MG Tabs tablet Commonly known as: ELIQUIS Take 5 mg by mouth 2 (two) times daily.   benazepril 40 MG tablet Commonly known as: LOTENSIN Take 40 mg by mouth daily after supper.   Insulin Lispro Prot & Lispro (75-25) 100 UNIT/ML Kwikpen Commonly known as: HumaLOG Mix 75/25 KwikPen INJECT 12 UNITS SUBCUTANEOUSLY IN THE MORNING AND 22 UNITS IN THE EVENING   metoprolol succinate 25 MG 24 hr tablet Commonly known as: TOPROL-XL TAKE 1 TABLET BY MOUTH ONCE DAILY **TAKE  WITH  OR  IMMEDIATELY  FOLLOWING  A  MEAL   NovoFine Plus 32G X 4 MM Misc Generic drug: Insulin Pen Needle Use to inject insulin twice daily.   OneTouch Verio test strip Generic drug: glucose blood USE 1 STRIP TO CHECK GLUCOSE TWICE DAILY   Repatha 140 MG/ML Sosy Generic drug: Evolocumab Inject 1 pen into the skin every 14 (fourteen) days.        Allergies:  Allergies  Allergen Reactions   Penicillins Anaphylaxis    Has patient had a PCN reaction causing immediate rash, facial/tongue/throat swelling, SOB or lightheadedness with hypotension: Yes Has patient had a PCN reaction causing severe rash involving mucus membranes or skin necrosis: Yes Has patient had a PCN reaction that required hospitalization Yes Has patient had a PCN reaction occurring within the last 10 years: No If all of the above answers are "NO", then may proceed with Cephalosporin use.   Tetanus Toxoids Anaphylaxis, Shortness Of Breath and Swelling   Sulfa Antibiotics Nausea And Vomiting and Swelling   Statins    Trulicity [Dulaglutide]     nausea    Past Medical History:  Diagnosis Date   Atrial fibrillation (HCC)    Bronchitis    chronic bronchitis   COPD (chronic obstructive pulmonary disease) (HCC)    Diabetes mellitus without complication (HCC)    Elevated lactic acid level    Hyperlipidemia    Hypertension    PONV (postoperative nausea and vomiting)     post op nausea   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    Shakes    Sleep apnea    cpap at night    Past Surgical History:  Procedure Laterality Date   CARDIOVERSION N/A 01/25/2016   Procedure: CARDIOVERSION;  Surgeon: Yates DecampJay Ganji, MD;  [facility];  Service: Cardiovascular;  Laterality: N/A;   COLONOSCOPY  02/02/2012   Procedure: COLONOSCOPY;  Surgeon: Theda Belfast, MD;  Location: [facility];  Service: Endoscopy;  Laterality: N/A;   HERNIA REPAIR     HYDROCELE EXCISION / REPAIR     I & D EXTREMITY Left 11/24/2017   Procedure: IRRIGATION AND DEBRIDEMENT LEFT FOOT, REMOVAL OF TWO SCREWS, INSERTION OF ANTIBIOTIC BEADS;  Surgeon: Felecia Shelling, DPM;  Location: [facility] OR;  Service: Podiatry;  Laterality: Left;   JOINT REPLACEMENT     rt knee   ROTATOR CUFF REPAIR  rt   TOTAL HIP ARTHROPLASTY Right 07/14/2014   Procedure: RIGHT TOTAL HIP ARTHROPLASTY;  Surgeon: Durene Romans, MD;  Location: [facility];  Service: Orthopedics;  Laterality: Right;    Family History  Problem Relation Age of Onset   Diabetes type II Mother    Prostate cancer Father 18    Social History:  reports that he has never smoked. He has never used smokeless tobacco. He reports that he does not drink alcohol and does not use drugs.   Review of Systems   Lipid history: He had muscle and joint pains with Lipitor and also with Crestor prescribed by PCP which he could not tolerate apparently  He had been on Repatha previously but not recently diabetes too expensive  In 4/22 he was told to try fluvastatin XL.  However he thinks he had some side effects which he does not remember and is not taking any medication Recent labs from cardiology show LDL significantly high Only recently is trying to cut back on red meat    Lab Results  Component Value Date   CHOL 226 (H) 10/20/2020   HDL 44  10/20/2020   LDLCALC 159 (H) 10/20/2020   TRIG 128 10/20/2020   CHOLHDL 5.1 (H) 10/20/2020           Hypertension: Has been on treatment, treatment includes benazepril 40 mg and amlodipine followed by PCP  BP Readings from Last 3 Encounters:  10/27/20 136/78  10/07/20 (!) 142/91  07/12/20 (!) 142/86   Has had mild renal dysfunction, creatinine levels as follows  Lab Results  Component Value Date   CREATININE 1.33 (H) 10/07/2020   CREATININE 1.4 (A) 05/14/2019   CREATININE 1.45 12/31/2018    Most recent eye exam was in 2022, no report available from Dr. Harriette Bouillon  Most recent foot exam: 4/22, also done by PCP  Currently known complications of diabetes: Mild neuropathy    LABS:  Office Visit on 10/27/2020  Component Date Value Ref Range Status   Hemoglobin A1C 10/27/2020 6.8 (A) 4.0 - 5.6 % Final    Physical Examination:  BP 136/78   Pulse (!) 53   Ht 6' (1.829 m)   Wt (!) 347 lb 6.4 oz (157.6 kg)   SpO2 94%   BMI 47.12 kg/m             ASSESSMENT:  Diabetes type 2 with obesity  See history of present illness for detailed discussion of current diabetes management, blood sugar patterns and problems identified  His A1c is about the same at 6.8  He appears to have relatively mild diabetes with taking only small doses of premixed insulin twice a day  Although his A1c is 6.8 is fasting blood sugars are relatively better and averaging about 116 Only recently has started improving his diet and started losing a lot of weight Trying to be a little more active also  He claims  that he had shortness of breath with taking Marcelline Deist but he has shortness of breath anyway However he refuses to consider trying this again despite explaining the benefits  short-term and long-term   Hyperlipidemia: He has a history of coronary disease but not on any lipid-lowering drugs Unclear what side effects he had from Lescol which he was trying to take every day     PLAN:    Again reminded him of need to check blood sugars after meals more consistently to help further adjust diet and insulin doses  Discussed having consistent amount of protein at every meal and continue low fat diet and avoiding regular soft drinks  Encouraged him to increase physical activity and walk as much as possible May consider Jardiance as a trial if covered by insurance on the next visit especially if he has difficulty losing weight consistently  For his hyperlipidemia since he cannot afford Repatha he will need to at least try low-dose statin  He agrees to at least try fluvastatin previously prescribed twice a week and let us know exactly what side effects he has if any May need to continue increasing the dose gradually especially if he has continued hypercholesterolemia  Follow-up in 3 months  Patient Instructions  Check blood sugars on waking up 3-4  days a week  Also check blood sugars about 2 hours after meals and do this after different meals by rotation  Recommended blood sugar levels on waking up are 90-130 and about 2 hours after meal is 130-160  Please bring your blood sugar monitor to each visit, thank you  Reduce am dose to 10 units if afternoon sugar <80  TRY FLUVASTATIN 2 DAYS A WEEK        Valerie Cones 10/28/2020, 9:56 AM   Note: This office note was prepared with Dragon voice recognition system technology. Any transcriptional errors that result from this process are unintentional.

## 2020-10-27 NOTE — Patient Instructions (Addendum)
Check blood sugars on waking up 3-4  days a week  Also check blood sugars about 2 hours after meals and do this after different meals by rotation  Recommended blood sugar levels on waking up are 90-130 and about 2 hours after meal is 130-160  Please bring your blood sugar monitor to each visit, thank you  Reduce am dose to 10 units if afternoon sugar <80  TRY FLUVASTATIN 2 DAYS A WEEK

## 2020-10-28 ENCOUNTER — Telehealth: Payer: Self-pay

## 2020-10-28 NOTE — Telephone Encounter (Signed)
Can you help with this, repatha denial?

## 2020-10-28 NOTE — Telephone Encounter (Signed)
I need to talk to the insurance agents

## 2020-10-28 NOTE — Telephone Encounter (Signed)
Patients repatha has been appealed and denied

## 2020-11-18 ENCOUNTER — Ambulatory Visit: Payer: Medicare Other | Admitting: Cardiology

## 2020-11-18 ENCOUNTER — Other Ambulatory Visit: Payer: Self-pay

## 2020-11-18 ENCOUNTER — Encounter: Payer: Self-pay | Admitting: Cardiology

## 2020-11-18 VITALS — BP 173/91 | HR 48 | Temp 98.0°F | Resp 17 | Ht 72.0 in | Wt 357.4 lb

## 2020-11-18 DIAGNOSIS — I10 Essential (primary) hypertension: Secondary | ICD-10-CM

## 2020-11-18 DIAGNOSIS — I25118 Atherosclerotic heart disease of native coronary artery with other forms of angina pectoris: Secondary | ICD-10-CM

## 2020-11-18 DIAGNOSIS — I5032 Chronic diastolic (congestive) heart failure: Secondary | ICD-10-CM

## 2020-11-18 DIAGNOSIS — I4821 Permanent atrial fibrillation: Secondary | ICD-10-CM

## 2020-11-18 DIAGNOSIS — M609 Myositis, unspecified: Secondary | ICD-10-CM

## 2020-11-18 DIAGNOSIS — E782 Mixed hyperlipidemia: Secondary | ICD-10-CM

## 2020-11-18 MED ORDER — AMLODIPINE BESYLATE 10 MG PO TABS: 10.0000 mg | ORAL_TABLET | Freq: Every day | ORAL | 3 refills | Status: DC

## 2020-11-18 MED ORDER — EVOLOCUMAB 140 MG/ML ~~LOC~~ SOAJ
140.0000 mg | Freq: Once | SUBCUTANEOUS | Status: AC
  Administered 2020-11-18: 140 mg via SUBCUTANEOUS

## 2020-11-18 NOTE — Progress Notes (Signed)
Primary Physician/Referring:  Ralene Ok, MD  Patient ID: William Cameron, male    DOB: 12-Apr-1950, 70 y.o.   MRN: [id number]  Chief Complaint  Patient presents with   Follow-up    6 MONTH   Coronary Artery Disease   DIASTOLIC HEART FAILURE   Atrial Fibrillation    HPI: William Cameron  is a 70 y.o. male  with permanent atril fibrillation, hyperlipidemia with intolerance to statins, uncontrolled diabetes mellitus with stage 3 CKD, hypertension, morbid obesity with obstructive sleep apnea does not use CPAP regularly follows Dr. Vickey Huger, and severe 3 vessel CAD by coronary angiogram in Nov 2017 and recommended medical therapy due to diffuse disease.  He had Covid pneumonia in Nov 2021.   He presents here for here for an 6-week visit of CAD, hypertension and hyperlipidemia. He is presently doing well, states that dyspnea is remained stable.  He has no specific complaints today. He has gained weight as he returned from a trip to [facility] and visited his brother and states all they did was sit out and eat!!  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    Shakes    Sleep apnea    cpap at night    Past Surgical History:  Procedure Laterality Date   CARDIOVERSION N/A 01/25/2016   Procedure: CARDIOVERSION;  Surgeon: Yates Decamp, MD;  Location: [facility] END[facility]COPY;  Service: Cardiovascular;  Laterality: N/A;   COLONOSCOPY  02/02/2012   Procedure: COLONOSCOPY;  Surgeon: Theda Belfast, MD;  Location: [facility];  Service: Endoscopy;  Laterality: N/A;   HERNIA  REPAIR     HYDROCELE EXCISION / REPAIR     I & D EXTREMITY Left 11/24/2017   Procedure: IRRIGATION AND DEBRIDEMENT LEFT FOOT, REMOVAL OF TWO SCREWS, INSERTION OF ANTIBIOTIC BEADS;  Surgeon: Felecia Shelling, DPM;  Location: [facility] OR;  Service: Podiatry;  Laterality: Left;   JOINT REPLACEMENT     rt knee   ROTATOR CUFF REPAIR  rt   TOTAL HIP ARTHROPLASTY Right 07/14/2014   Procedure: RIGHT TOTAL HIP ARTHROPLASTY;  Surgeon: Durene Romans, MD;  Location: [facility];  Service: Orthopedics;  Laterality: Right;   Social History   Tobacco Use   Smoking status: Never   Smokeless tobacco: Never  Substance Use Topics   Alcohol use: No  marital Status: Widowed    Current Outpatient Medications on File Prior to Visit  Medication Sig Dispense Refill   apixaban (ELIQUIS) 5 MG TABS tablet Take 5 mg by mouth 2 (two) times daily.     benazepril (LOTENSIN) 40 MG tablet Take 40 mg by mouth daily after supper.      Evolocumab (REPATHA) 140 MG/ML SOSY Inject 1 pen into the skin every 14 (fourteen) days. 4.2 mL 3   Insulin Lispro Prot & Lispro (HUMALOG MIX 75/25 KWIKPEN) (75-25) 100 UNIT/ML Kwikpen INJECT 12 UNITS SUBCUTANEOUSLY IN THE MORNING AND 22 UNITS IN THE EVENING 15 mL 3   Insulin Pen Needle (NOVOFINE PLUS) 32G X  4 MM MISC Use to inject insulin twice daily. 100 each 2   metoprolol succinate (TOPROL-XL) 25 MG 24 hr tablet TAKE 1 TABLET BY MOUTH ONCE DAILY **TAKE  WITH  OR  IMMEDIATELY  FOLLOWING  A  MEAL 90 tablet 0   ONETOUCH VERIO test strip USE 1 STRIP TO CHECK GLUCOSE TWICE DAILY 100 each 2   No current facility-administered medications on file prior to visit.   Review of Systems  Constitutional: Negative for malaise/fatigue.  Cardiovascular:  Positive for dyspnea on exertion (Stable). Negative for leg swelling and syncope.  Respiratory:  Positive for sleep disturbances due to breathing (has OSA but not on CPAP). Negative for cough and sputum production.   Hematologic/Lymphatic: Does not bruise/bleed  easily.  Skin:  Positive for color change (from leg infection).  Musculoskeletal:  Positive for arthritis (diffuse), back pain and joint pain. Negative for joint swelling.  Neurological:  Negative for headaches and light-headedness.  Psychiatric/Behavioral:  Negative for depression and substance abuse. The patient does not have insomnia and is not nervous/anxious.   All other systems reviewed and are negative. Objective  Blood pressure (!) 173/91, pulse (!) 48, temperature 98 F (36.7 C), temperature source Temporal, resp. rate 17, height 6' (1.829 m), weight (!) 357 lb 6.4 oz (162.1 kg), SpO2 97 %. Body mass index is 48.47 kg/m.   Vitals with BMI 11/18/2020 11/18/2020 10/27/2020  Height - 6\' 0"  6\' 0"   Weight - 357 lbs 6 oz 347 lbs 6 oz  BMI - 48.46 47.11  Systolic 173 157  Diastolic 91 91 78  Pulse 48 56 53     Physical Exam Constitutional:      General: He is not in acute distress.    Appearance: He is well-developed.     Comments: Morbidly obese  HENT:     Head: Atraumatic.  Neck:     Vascular: No carotid bruit or JVD.     Comments: Short neck and difficult to evaluate JVP Cardiovascular:     Rate and Rhythm: Normal rate. Rhythm irregular.     Pulses:          Carotid pulses are 2+ on the right side and 2+ on the left side.      Dorsalis pedis pulses are 2+ on the right side and 2+ on the left side.       Posterior tibial pulses are 2+ on the right side and 2+ on the left side.     Heart sounds: Normal heart sounds. No murmur heard.   No gallop.     Comments: Femoral and popliteal pulse difficult to feel due to patient's body habitus.  Pulmonary:     Effort: Pulmonary effort is normal.     Breath sounds: Normal breath sounds.  Abdominal:     General: Bowel sounds are normal.     Palpations: Abdomen is soft.     Comments: Obese. Pannus present  Musculoskeletal:        General: No swelling.   Radiology: No results found.  Laboratory examination:    CMP Latest  Ref Rng & Units 10/07/2020 05/14/2019 12/31/2018  Glucose 65 - 99 mg/dL 05/16/2019) - 03/02/2019)  BUN 8 - 27 mg/dL 14 21 21   Creatinine 0.76 - 1.27 mg/dL 053(Z) 767(H)  Sodium 134 - 144 mmol/L 141 136(A) 133(L)  Potassium 3.5 - 5.2 mmol/L 4.5 4.8 4.8  Chloride 96 - 106 mmol/L 98 101 99  CO2 20 - 29 mmol/L 21 26(A) 26  Calcium 8.6 - 10.2 mg/dL 23.5 36.1 10.8(H)  Total Protein 6.0 - 8.3 g/dL - - 8.2  Total Bilirubin 0.2 - 1.2 mg/dL - - 0.6  Alkaline Phos 25 - 125 - 58 53  AST 14 - 40 - 21 45(H)  ALT 10 - 40 - 17 48   CBC Latest Ref Rng & Units 11/27/2017 11/26/2017 11/25/2017  WBC 4.0 - 10.5 K/uL 10.1 7.4 9.5  Hemoglobin 13.0 - 17.0 g/dL 44.3 12.4(L) 12.4(L)  Hematocrit 39.0 - 52.0 % 43.6 39.9 39.5  Platelets 150 - 400 K/uL 169 165 199   Lipid Panel Recent Labs    05/11/20 0000 10/20/20 1406  CHOL 188 226*  TRIG 96 128  LDLCALC 142 159*  HDL 46 44  CHOLHDL  --  5.1*     HEMOGLOBIN A1C Lab Results  Component Value Date   HGBA1C 6.8 (A) 10/27/2020   MPG 171.42 10/08/2017   TSH Recent Labs    05/11/20 0000  TSH 1.01    BNP (last 3 results) Recent Labs    10/07/20 1509  BNP 107.9*    Cardiac Studies:   Direct current cardioversion 01/25/2016: 120 x1, 150x1 to NSR.  Exercise sestamibi stress test 12/27/2015: 1. Two day protocol followed. The resting electrocardiogram demonstrated normal sinus rhythm, incomplete RBBB and no resting arrhythmias.  Low voltage and  poor R progression. The stress electrocardiogram was normal. Patient exercised on Bruce protocol for 4:13 minutes and achieved  4.84 METS. Stress test terminated due to % MPHR achieved (Target HR >85%). Symptoms included dyspnea. 2. The perfusion imaging study demonstrates diaphragmatic attenuation artifact in the inferior wall. There is no demonstrable ischemia or scar. LV systolic function was normal at 64%. This is a low risk study. 01/24/2016: Impression: EKG 11/13/2017: Atrial fibrillation with controlled  ventricular response at the rate of 66 bpm, leftward axis, poor R-wave progression, cannot exclude anterolateral infarct old. Low-voltage complexes. Nonspecific T abnormality. No significant change from EKG 11/15/2016.  Coronary angiogram 01/24/2016: Mid LAD occluded, D2 small to moderate sized proximal 80%. Mid Cx occluded with moderate OM1 which bifurcates. Both calcific CTO. RCA PL 1 has 80% stenosis (small) and PL3 occluded, mod-large vessel. LVEF 50-55% with apical akinesis.   Echocardiogram  05/13/2018:  Left ventricle cavity is normal in size. Normal global wall motion. Unable to evaluate diastolic function due to A. Fibrillation. Calculated EF 59%. Left atrial cavity is severely dilated at 5.4 cm. Right ventricle cavity is mild to moderately dilated. Mildly reduced right ventricular function. A moderateor band is noted at the RV apex (normal variant). Trace mitral regurgitation. Mild calcification of the mitral valve annulus. Mild mitral valve leaflet thickening. Trace tricuspid regurgitation. Unable to estimate PA pressure due to absence/minimal TR signal. IVC is dilated with poor inspiration collapse consistent with elevated right atrial pressure. Compared to the study done on 01/04/2016, no significant change.  EKG:  EKG 11/18/2020: Atrial fibrillation with controlled ventricular response at the rate of 44 bpm, inferior infarct old, anterolateral infarct old.  Low-voltage complexes.  Nonspecific T abnormality.  No significant change from EKG 10/07/2020:   Assessment     ICD-10-CM   1. Essential hypertension  I10 EKG 12-Lead    amLODipine (NORVASC) 10 MG tablet    2. Mixed hyperlipidemia  E78.2 Lipid Panel With LDL/HDL Ratio    Evolocumab SOAJ 140 mg    3. Coronary artery disease of native artery of native heart with stable angina pectoris (HCC)  I25.118  4. 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  Permanent atrial fibrillation (HCC) CHA2DS2-VASc Score is 4. Yearly risk of stroke: 4.  I48.21       CHA2DS2-VASc Score is 4.  Yearly risk of stroke: 4.8% (A, HTN, DM, Vasc Dz).  Score of 1=0.6; 2=2.2; 3=3.2; 4=4.8; 5=7.2; 6=9.8; 7=>9.8) -(CHF; HTN; vasc disease DM,  Male = 1; Age <65 =0; 65-74 = 1,  >75 =2; stroke/embolism= 2).    Administration Action Time Recorded Time Documented By Site Comment Reason Patient Supplied  Given : 140 mg :   : Subcutaneous 11/18/20 1551 11/18/20 1552 Erby PianMilton, Cecilia, CMA Left Anterior Thigh NDC [phone]  No    Meds ordered this encounter  Medications   amLODipine (NORVASC) 10 MG tablet    Sig: Take 1 tablet (10 mg total) by mouth daily.    Dispense:  90 tablet    Refill:  3   Evolocumab SOAJ 140 mg     Medications Discontinued During This Encounter  Medication Reason   amLODipine (NORVASC) 5 MG tablet Reorder     Orders Placed This Encounter  Procedures   Lipid Panel With LDL/HDL Ratio   EKG 12-Lead    Recommendations:   William MylarMichael E Cameron  is a 70 y.o.  male  with permanent atril fibrillation, hyperlipidemia with intolerance to statins, uncontrolled diabetes mellitus with stage 3 CKD, hypertension, morbid obesity with obstructive sleep apnea does not use CPAP regularly follows Dr. Vickey Hugerohmeier, and severe 3 vessel CAD by coronary angiogram in Nov 2017 and recommended medical therapy due to diffuse disease.    This is 6-week office visit, states that he is feeling well. His diabetes is now well controlled, no clinical evidence of heart failure, no leg edema, no JVD.     With regard to atrial fibrillation, heart rate remains well controlled.  With regard to chronic diastolic heart failure, he has not had any acute decompensated heart failure.  I had Repatha approved for him however patient did not want to start the medication.  After long discussions regarding the side effects, he is willing to start medicines, 140 mg subcu was given to the patient  today.  Since blood pressure is not well controlled as he has gained significant amount of weight after his recent trip and he admits to eating excessive amounts of food. For now increase Amlodipine to 10 mg daily.    Yates DecampJay Luella Gardenhire, MD, [facility] 11/21/2020, 9:52 AM Office: [phone] Pager: [phone]

## 2020-11-18 NOTE — Progress Notes (Signed)
Primary Physician/Referring:  Ralene Ok, MD  Patient ID: William Cameron, male    DOB: 1950-07-21, 70 y.o.   MRN: [id number]  Chief Complaint  Patient presents with   Follow-up    6 MONTH   Coronary Artery Disease   DIASTOLIC HEART FAILURE   Atrial Fibrillation    HPI: William Cameron  is a 70 y.o. male male  with permanent atril fibrillation, hyperlipidemia with intolerance to statins, uncontrolled diabetes mellitus with stage 3 CKD, hypertension, morbid obesity with obstructive sleep apnea does not use CPAP regularly follows Dr. Vickey Huger, and severe 3 vessel CAD by coronary angiogram in Nov 2017 and recommended medical therapy due to diffuse disease.    This is 6-week office visit, states that he is feeling well, just returned from a trip to [place] and spent one week with his brother and states that he had no inhibition and ate a lot.  He is presently doing well, states that dyspnea is remained stable.  He has no specific complaints today.   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    Shakes    Sleep apnea    cpap at night    Past Surgical History:  Procedure Laterality Date   CARDIOVERSION N/A 01/25/2016   Procedure: CARDIOVERSION;  Surgeon: Yates Decamp, MD;  Location: [facility] ENDOSCOPY;  Service: Cardiovascular;  Laterality: N/A;   COLONOSCOPY  02/02/2012   Procedure: COLONOSCOPY;  Surgeon: Theda Belfast, MD;  Location: [facility] ENDOSCOPY;  Service: Endoscopy;  Laterality: N/A;   HERNIA REPAIR     HYDROCELE EXCISION / REPAIR     I & D  EXTREMITY Left 11/24/2017   Procedure: IRRIGATION AND DEBRIDEMENT LEFT FOOT, REMOVAL OF TWO SCREWS, INSERTION OF ANTIBIOTIC BEADS;  Surgeon: Felecia Shelling, DPM;  Location: [facility] OR;  Service: Podiatry;  Laterality: Left;   JOINT REPLACEMENT     rt knee   ROTATOR CUFF REPAIR  rt   TOTAL HIP ARTHROPLASTY Right 07/14/2014   Procedure: RIGHT TOTAL HIP ARTHROPLASTY;  Surgeon: Durene Romans, MD;  Location: [facility] ORS;  Service: Orthopedics;  Laterality: Right;   Social History   Tobacco Use   Smoking status: Never   Smokeless tobacco: Never  Substance Use Topics   Alcohol use: No  marital Status: Widowed    Current Outpatient Medications on File Prior to Visit  Medication Sig Dispense Refill   amLODipine (NORVASC) 5 MG tablet Take 5 mg by mouth daily.     apixaban (ELIQUIS) 5 MG TABS tablet Take 5 mg by mouth 2 (two) times daily.     benazepril (LOTENSIN) 40 MG tablet Take 40 mg by mouth daily after supper.      Evolocumab (REPATHA) 140 MG/ML SOSY Inject 1 pen into the skin every 14 (fourteen) days. 4.2 mL 3   Insulin Lispro Prot & Lispro (HUMALOG MIX 75/25 KWIKPEN) (75-25) 100 UNIT/ML Kwikpen INJECT 12 UNITS SUBCUTANEOUSLY IN THE MORNING AND 22 UNITS IN THE EVENING 15 mL 3  Insulin Pen Needle (NOVOFINE PLUS) 32G X 4 MM MISC Use to inject insulin twice daily. 100 each 2   metoprolol succinate (TOPROL-XL) 25 MG 24 hr tablet TAKE 1 TABLET BY MOUTH ONCE DAILY **TAKE  WITH  OR  IMMEDIATELY  FOLLOWING  A  MEAL 90 tablet 0   ONETOUCH VERIO test strip USE 1 STRIP TO CHECK GLUCOSE TWICE DAILY 100 each 2   No current facility-administered medications on file prior to visit.   Review of Systems  Constitutional: Negative for malaise/fatigue.  Cardiovascular:  Positive for dyspnea on exertion (Stable). Negative for leg swelling and syncope.  Respiratory:  Positive for sleep disturbances due to breathing (has OSA but not on CPAP). Negative for cough and sputum production.   Hematologic/Lymphatic: Does not  bruise/bleed easily.  Skin:  Positive for color change (from leg infection).  Musculoskeletal:  Positive for arthritis (diffuse), back pain and joint pain. Negative for joint swelling.  Neurological:  Negative for headaches and light-headedness.  Psychiatric/Behavioral:  Negative for depression and substance abuse. The patient does not have insomnia and is not nervous/anxious.   All other systems reviewed and are negative. Objective  Blood pressure (!) 173/91, pulse (!) 48, temperature 98 F (36.7 C), temperature source Temporal, resp. rate 17, height 6' (1.829 m), weight (!) 357 lb 6.4 oz (162.1 kg), SpO2 97 %. Body mass index is 48.47 kg/m.   Vitals with BMI 11/18/2020 11/18/2020 10/27/2020  Height - 6\' 0"  6\' 0"   Weight - 357 lbs 6 oz 347 lbs 6 oz  BMI - 48.46 47.11  Systolic 173 157 [id number]  Diastolic 91 91 78  Pulse 48 56 53      Physical Exam Constitutional:      General: He is not in acute distress.    Appearance: He is well-developed.     Comments: Morbidly obese  HENT:     Head: Atraumatic.  Neck:     Vascular: No carotid bruit or JVD.     Comments: Short neck and difficult to evaluate JVP Cardiovascular:     Rate and Rhythm: Normal rate. Rhythm irregular.     Pulses:          Carotid pulses are 2+ on the right side and 2+ on the left side.      Dorsalis pedis pulses are 2+ on the right side and 2+ on the left side.       Posterior tibial pulses are 2+ on the right side and 2+ on the left side.     Heart sounds: Normal heart sounds. No murmur heard.   No gallop.     Comments: Femoral and popliteal pulse difficult to feel due to patient's body habitus.  Pulmonary:     Effort: Pulmonary effort is normal.     Breath sounds: Normal breath sounds.  Abdominal:     General: Bowel sounds are normal.     Palpations: Abdomen is soft.     Comments: Obese. Pannus present  Musculoskeletal:        General: No swelling.   Radiology: No results found.  Laboratory examination:     CMP Latest Ref Rng & Units 10/07/2020 05/14/2019 12/31/2018  Glucose 65 - 99 mg/dL 096(E102(H) - 454(U175(H)  BUN 8 - 27 mg/dL 14 21 21   Creatinine 0.76 - 1.27 mg/dL 9.81(X1.33(H) 9.1(Y1.4(A) 7.821.45  Sodium 134 - 144 mmol/L 141 136(A) 133(L)  Potassium 3.5 - 5.2 mmol/L 4.5 4.8 4.8  Chloride 96 - 106 mmol/L 98 101 99  CO2 20 - 29 mmol/L 21 26(A) 26  Calcium 8.6 - 10.2 mg/dL 72.5 36.6 10.8(H)  Total Protein 6.0 - 8.3 g/dL - - 8.2  Total Bilirubin 0.2 - 1.2 mg/dL - - 0.6  Alkaline Phos 25 - 125 - 58 53  AST 14 - 40 - 21 45(H)  ALT 10 - 40 - 17 48   CBC Latest Ref Rng & Units 11/27/2017 11/26/2017 11/25/2017  WBC 4.0 - 10.5 K/uL 10.1 7.4 9.5  Hemoglobin 13.0 - 17.0 g/dL 44.0 12.4(L) 12.4(L)  Hematocrit 39.0 - 52.0 % 43.6 39.9 39.5  Platelets 150 - 400 K/uL 169 165 199   Lipid Panel Recent Labs    05/11/20 0000 10/20/20 1406  CHOL 188 226*  TRIG 96 128  LDLCALC 142 159*  HDL 46 44  CHOLHDL  --  5.1*      HEMOGLOBIN A1C Lab Results  Component Value Date   HGBA1C 6.8 (A) 10/27/2020   MPG 171.42 10/08/2017   TSH Recent Labs    05/11/20 0000  TSH 1.01     BNP (last 3 results) Recent Labs    10/07/20 1509  BNP 107.9*     Cardiac Studies:   Direct current cardioversion 01/25/2016: 120 x1, 150x1 to NSR.  Exercise sestamibi stress test 12/27/2015: 1. Two day protocol followed. The resting electrocardiogram demonstrated normal sinus rhythm, incomplete RBBB and no resting arrhythmias.  Low voltage and  poor R progression. The stress electrocardiogram was normal. Patient exercised on Bruce protocol for 4:13 minutes and achieved  4.84 METS. Stress test terminated due to % MPHR achieved (Target HR >85%). Symptoms included dyspnea. 2. The perfusion imaging study demonstrates diaphragmatic attenuation artifact in the inferior wall. There is no demonstrable ischemia or scar. LV systolic function was normal at 64%. This is a low risk study. 01/24/2016: Impression: EKG 11/13/2017: Atrial fibrillation  with controlled ventricular response at the rate of 66 bpm, leftward axis, poor R-wave progression, cannot exclude anterolateral infarct old. Low-voltage complexes. Nonspecific T abnormality. No significant change from EKG 11/15/2016.  Coronary angiogram 01/24/2016: Mid LAD occluded, D2 small to moderate sized proximal 80%. Mid Cx occluded with moderate OM1 which bifurcates. Both calcific CTO. RCA PL 1 has 80% stenosis (small) and PL3 occluded, mod-large vessel. LVEF 50-55% with apical akinesis.   Echocardiogram  05/13/2018:  Left ventricle cavity is normal in size. Normal global wall motion. Unable to evaluate diastolic function due to A. Fibrillation. Calculated EF 59%. Left atrial cavity is severely dilated at 5.4 cm. Right ventricle cavity is mild to moderately dilated. Mildly reduced right ventricular function. A moderateor band is noted at the RV apex (normal variant). Trace mitral regurgitation. Mild calcification of the mitral valve annulus. Mild mitral valve leaflet thickening. Trace tricuspid regurgitation. Unable to estimate PA pressure due to absence/minimal TR signal. IVC is dilated with poor inspiration collapse consistent with elevated right atrial pressure. Compared to the study done on 01/04/2016, no significant change.  EKG:  EKG 10/07/2020: Atrial fibrillation with controlled ventricular response at the rate of 64 bpm, left axis deviation, left anterior fascicular block.  Incomplete right bundle branch block.  Anterolateral infarct old.  Nonspecific T abnormality.  Low-voltage complexes.  No change from 04/07/2020.    EKG 04/09/2018: Sinus arrest with junctional escape rhythm at the rate of 54 bpm, left axis deviation, left anterior fascicular block.  Incomplete right bundle branch block.  Anterolateral infarct old.  Low-voltage complexes.  Nonspecific T abnormality.   Assessment  ICD-10-CM   1. Essential hypertension  I10 EKG 12-Lead      CHA2DS2-VASc Score is 4.  Yearly  risk of stroke: 4.8% (A, HTN, DM, Vasc Dz).  Score of 1=0.6; 2=2.2; 3=3.2; 4=4.8; 5=7.2; 6=9.8; 7=>9.8) -(CHF; HTN; vasc disease DM,  Male = 1; Age <65 =0; 65-74 = 1,  >75 =2; stroke/embolism= 2).    No orders of the defined types were placed in this encounter.    There are no discontinued medications.    Orders Placed This Encounter  Procedures   EKG 12-Lead    Recommendations:   William Cameron  is a 70 y.o.  male  with permanent atril fibrillation, hyperlipidemia with intolerance to statins, uncontrolled diabetes mellitus with stage 3 CKD, hypertension, morbid obesity with obstructive sleep apnea does not use CPAP regularly follows Dr. Vickey Huger, and severe 3 vessel CAD by coronary angiogram in Nov 2017 and recommended medical therapy due to diffuse disease.    This is 6-week office visit, states that he is feeling well, just returned from a trip to [place] and spent one week with his brother and states that he had no inhibition and ate a lot. Has gained weight and BP is elevated.   With regard to atrial fibrillation, heart rate remains well controlled.  With regard to chronic diastolic heart failure, he has not had any acute decompensated heart failure. From coronary artery disease standpoint he has not had any recurrence of angina pectoris as well.  I got him approved for Repatha, but he did not want to start this stating that he will have reaction. We discussed adverse reactions and administered 140 mg sq today and he has Rx at his pharmacy and approved by his insurance.   BP is elevated, probably due to weight gain and poor diet. I will follow up in 6-8 weeks    Yates Decamp, MD, [facility] 11/18/2020, 3:17 PM Office: [phone] Pager: [phone]

## 2020-11-19 ENCOUNTER — Telehealth: Payer: Self-pay

## 2020-11-19 NOTE — Telephone Encounter (Signed)
Pt called and stated that he was given rapatha and today his BP is 185/97 and he has a very bad headache. He took amlodipine 10mg  before bed and took another one this morning after checking his BP. He also has a Lack of appetite and he stated that its strange for him. Patient would like to know if this could be because of the rapatha. Please advise.

## 2020-11-19 NOTE — Telephone Encounter (Signed)
MyChart message sent.  His recent elevated blood pressure related to weight gain and excessive intake of food while he was in [place] with his brother

## 2020-11-22 NOTE — Telephone Encounter (Signed)
From pt

## 2021-01-21 ENCOUNTER — Other Ambulatory Visit (INDEPENDENT_AMBULATORY_CARE_PROVIDER_SITE_OTHER): Payer: Medicare Other

## 2021-01-21 ENCOUNTER — Other Ambulatory Visit: Payer: Self-pay

## 2021-01-21 DIAGNOSIS — E1165 Type 2 diabetes mellitus with hyperglycemia: Secondary | ICD-10-CM | POA: Diagnosis not present

## 2021-01-21 DIAGNOSIS — E782 Mixed hyperlipidemia: Secondary | ICD-10-CM

## 2021-01-21 DIAGNOSIS — Z794 Long term (current) use of insulin: Secondary | ICD-10-CM

## 2021-01-21 LAB — LIPID PANEL
Cholesterol: 221 mg/dL — ABNORMAL HIGH (ref 0–200)
HDL: 42.6 mg/dL (ref >39.00)
LDL Cholesterol: 156 mg/dL — ABNORMAL HIGH (ref 0–99)
NonHDL: 178.56
Total CHOL/HDL Ratio: 5
Triglycerides: 114 mg/dL (ref 0.0–149.0)
VLDL: 22.8 mg/dL (ref 0.0–40.0)

## 2021-01-21 LAB — COMPREHENSIVE METABOLIC PANEL
ALT: 10 U/L (ref 0–53)
AST: 16 U/L (ref 0–37)
Albumin: 4.4 g/dL (ref 3.5–5.2)
Alkaline Phosphatase: 81 U/L (ref 39–117)
BUN: 16 mg/dL (ref 6–23)
CO2: 27 mEq/L (ref 19–32)
Calcium: 9.7 mg/dL (ref 8.4–10.5)
Chloride: 103 mEq/L (ref 96–112)
Creatinine, Ser: 1.14 mg/dL (ref 0.40–1.50)
GFR: 65.07 mL/min (ref >60.00)
Glucose, Bld: 125 mg/dL — ABNORMAL HIGH (ref 70–99)
Potassium: 4.4 mEq/L (ref 3.5–5.1)
Sodium: 138 mEq/L (ref 135–145)
Total Bilirubin: 0.7 mg/dL (ref 0.2–1.2)
Total Protein: 7.7 g/dL (ref 6.0–8.3)

## 2021-01-21 LAB — HEMOGLOBIN A1C: Hgb A1c MFr Bld: 7.2 % — ABNORMAL HIGH (ref 4.6–6.5)

## 2021-01-27 ENCOUNTER — Ambulatory Visit (INDEPENDENT_AMBULATORY_CARE_PROVIDER_SITE_OTHER): Payer: Medicare Other | Admitting: Endocrinology

## 2021-01-27 ENCOUNTER — Encounter: Payer: Self-pay | Admitting: Endocrinology

## 2021-01-27 ENCOUNTER — Other Ambulatory Visit: Payer: Self-pay

## 2021-01-27 VITALS — BP 150/88 | HR 88 | Ht 72.0 in | Wt 355.8 lb

## 2021-01-27 DIAGNOSIS — E1165 Type 2 diabetes mellitus with hyperglycemia: Secondary | ICD-10-CM | POA: Diagnosis not present

## 2021-01-27 DIAGNOSIS — E782 Mixed hyperlipidemia: Secondary | ICD-10-CM

## 2021-01-27 DIAGNOSIS — Z794 Long term (current) use of insulin: Secondary | ICD-10-CM | POA: Diagnosis not present

## 2021-01-27 LAB — GLUCOSE, POCT (MANUAL RESULT ENTRY): POC Glucose: 136 mg/dl — AB (ref 70–99)

## 2021-01-27 NOTE — Patient Instructions (Addendum)
Check blood sugars on waking up 3-4 days a week  Also check blood sugars about 2 hours after meals and do this after different meals by rotation  Recommended blood sugar levels on waking up are 90-130 and about 2 hours after meal is 130-180  Please bring your blood sugar monitor to each visit, thank you  

## 2021-01-27 NOTE — Progress Notes (Signed)
Patient ID: William Cameron, male   DOB: 1951/03/08, 70 y.o.   MRN: [id number]           Reason for Appointment: Follow-up   Referring PCP: Ralene Ok   History of Present Illness:          Date of diagnosis of type 2 diabetes mellitus:   Around year 2000      Background history:  Patient has very poor recall about his initial diagnosis and previous treatment Also no records available to review diabetes management and previous A1c results He does not know if he took metformin However in 2016 his A1c was 6% and he thinks that his blood sugars started going up only when he had staph infection in his foot in 7/19 when his A1c was 7.6  He apparently stopped [place] in 2020 because of high out-of-pocket expense Previously had tried Trulicity but apparently had nausea with this and not clear when this was stopped   Recent history:      INSULIN regimen: Humalog mix 12 units in a.m. and before dinner: 22 units  Non-insulin hypoglycemic drugs the patient is taking are: None  His A1c is 7.2 compared to 6.8  Current management, blood sugar patterns and problems identified:  He did not bring his monitor for download Also usually only checking his blood sugars on waking up Although his morning sugars are still fairly good he says they may be higher at times when he is going off his diet the night before  He says he has had to attend a lot of social functions and has been eating more high-fat meals and sweets when going out  Also usually not doing any cooking at home and frequently eating fast food  He does not like to eat breakfast and may eat a late lunch and late dinner  He is somewhat active with yard work  As expected his weight is up about 8 pounds since his last visit  Has previously refused to take medications other than insulin                  Side effects from diabetes medicines:?  Nausea from Trulicity   Glucose monitoring:  Using One Touch Verio, blood sugars by  recall:   PRE-MEAL Fasting Lunch Dinner Bedtime Overall  Glucose range: 118-123      Mean/median:        POST-MEAL PC Breakfast PC Lunch PC Dinner  Glucose range:   ?  Mean/median:      Previously:  PRE-MEAL Fasting Lunch Dinner Bedtime Overall  Glucose range: 92-145  101  84-145  Mean/median: 116    113   POST-MEAL PC Breakfast PC Lunch PC Dinner  Glucose range:   ?  Mean/median:        Dietician visit, most recent: 12/20 CDE visit: 9/20  Weight history:  Wt Readings from Last 3 Encounters:  01/27/21 (!) 355 lb 12.8 oz (161.4 kg)  11/18/20 (!) 357 lb 6.4 oz (162.1 kg)  10/27/20 (!) 347 lb 6.4 oz (157.6 kg)    Glycemic control:   Lab Results  Component Value Date   HGBA1C 7.2 (H) 01/21/2021   HGBA1C 6.8 (A) 10/27/2020   HGBA1C 6.7 (A) 07/12/2020   Lab Results  Component Value Date   MICROALBUR 159 05/11/2020   LDLCALC 156 (H) 01/21/2021   CREATININE 1.14 01/21/2021   Lab Results  Component Value Date   MICRALBCREAT 8.5 02/23/2020    No results found for:  FRUCTOSAMINE  Office Visit on 01/27/2021  Component Date Value Ref Range Status   POC Glucose 01/27/2021 136 (A)  70 - 99 mg/dl Final  Lab on 92/01/9416  Component Date Value Ref Range Status   Cholesterol 01/21/2021 221 (A)  0 - 200 mg/dL Final   ATP III Classification       Desirable:  < 200 mg/dL               Borderline High:  200 - 239 mg/dL          High:  > = 408 mg/dL   Triglycerides 14/48/1856 114.0  0.0 - 149.0 mg/dL Final   Normal:  <314 mg/dLBorderline High:  150 - 199 mg/dL   HDL 97/04/6376 58.85  >39.00 mg/dL Final   VLDL 02/77/4128 22.8  0.0 - 40.0 mg/dL Final   LDL Cholesterol 01/21/2021 156 (A)  0 - 99 mg/dL Final   Total CHOL/HDL Ratio 01/21/2021 5   Final                  Men          Women1/2 Average Risk     3.4          3.3Average Risk          5.0          4.42X Average Risk          9.6          7.13X Average Risk          15.0          11.0                       NonHDL  01/21/2021 178.56   Final   NOTE:  Non-HDL goal should be 30 mg/dL higher than patient's LDL goal (i.e. LDL goal of < 70 mg/dL, would have non-HDL goal of < 100 mg/dL)   Sodium 78/67/6720 947  135 - 145 mEq/L Final   Potassium 01/21/2021 4.4  3.5 - 5.1 mEq/L Final   Chloride 01/21/2021 103  96 - 112 mEq/L Final   CO2 01/21/2021 27  19 - 32 mEq/L Final   Glucose, Bld 01/21/2021 125 (A)  70 - 99 mg/dL Final   BUN 09/62/8366 16  6 - 23 mg/dL Final   Creatinine, Ser 01/21/2021 1.14  0.40 - 1.50 mg/dL Final   Total Bilirubin 01/21/2021 0.7  0.2 - 1.2 mg/dL Final   Alkaline Phosphatase 01/21/2021 81  39 - 117 U/L Final   AST 01/21/2021 16  0 - 37 U/L Final   ALT 01/21/2021 10  0 - 53 U/L Final   Total Protein 01/21/2021 7.7  6.0 - 8.3 g/dL Final   Albumin 29/47/6546 4.4  3.5 - 5.2 g/dL Final   GFR 50/35/4656 65.07  >60.00 mL/min Final   Calculated using the CKD-EPI Creatinine Equation (2021)   Calcium 01/21/2021 9.7  8.4 - 10.5 mg/dL Final   Hgb C1E MFr Bld 01/21/2021 7.2 (A)  4.6 - 6.5 % Final   Glycemic Control Guidelines for People with Diabetes:Non Diabetic:  <6%Goal of Therapy: <7%Additional Action Suggested:  >8%     Allergies as of 01/27/2021       Reactions   Penicillins Anaphylaxis   Has patient had a PCN reaction causing immediate rash, facial/tongue/throat swelling, SOB or lightheadedness with hypotension: Yes Has patient had a PCN reaction causing severe rash involving mucus membranes or skin  necrosis: Yes Has patient had a PCN reaction that required hospitalization Yes Has patient had a PCN reaction occurring within the last 10 years: No If all of the above answers are "NO", then may proceed with Cephalosporin use.   Tetanus Toxoids Anaphylaxis, Shortness Of Breath, Swelling   Sulfa Antibiotics Nausea And Vomiting, Swelling   Statins    Trulicity [dulaglutide]    nausea        Medication List        Accurate as of January 27, 2021  8:33 AM. If you have any questions,  ask your nurse or doctor.          amLODipine 10 MG tablet Commonly known as: NORVASC Take 1 tablet (10 mg total) by mouth daily.   apixaban 5 MG Tabs tablet Commonly known as: ELIQUIS Take 5 mg by mouth 2 (two) times daily.   benazepril 40 MG tablet Commonly known as: LOTENSIN Take 40 mg by mouth daily after supper.   Insulin Lispro Prot & Lispro (75-25) 100 UNIT/ML Kwikpen Commonly known as: HumaLOG Mix 75/25 KwikPen INJECT 12 UNITS SUBCUTANEOUSLY IN THE MORNING AND 22 UNITS IN THE EVENING   metoprolol succinate 25 MG 24 hr tablet Commonly known as: TOPROL-XL TAKE 1 TABLET BY MOUTH ONCE DAILY **TAKE  WITH  OR  IMMEDIATELY  FOLLOWING  A  MEAL   NovoFine Plus 32G X 4 MM Misc Generic drug: Insulin Pen Needle Use to inject insulin twice daily.   OneTouch Verio test strip Generic drug: glucose blood USE 1 STRIP TO CHECK GLUCOSE TWICE DAILY   Repatha 140 MG/ML Sosy Generic drug: Evolocumab Inject 1 pen into the skin every 14 (fourteen) days.        Allergies:  Allergies  Allergen Reactions   Penicillins Anaphylaxis    Has patient had a PCN reaction causing immediate rash, facial/tongue/throat swelling, SOB or lightheadedness with hypotension: Yes Has patient had a PCN reaction causing severe rash involving mucus membranes or skin necrosis: Yes Has patient had a PCN reaction that required hospitalization Yes Has patient had a PCN reaction occurring within the last 10 years: No If all of the above answers are "NO", then may proceed with Cephalosporin use.   Tetanus Toxoids Anaphylaxis, Shortness Of Breath and Swelling   Sulfa Antibiotics Nausea And Vomiting and Swelling   Statins    Trulicity [Dulaglutide]     nausea    Past Medical History:  Diagnosis Date   Atrial fibrillation (HCC)    Bronchitis    chronic bronchitis   COPD (chronic obstructive pulmonary disease) (HCC)    Diabetes mellitus without complication (HCC)    Elevated lactic acid level     Hyperlipidemia    Hypertension    PONV (postoperative nausea and vomiting)    post op nausea   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    Shakes    Sleep apnea    cpap at night    Past Surgical History:  Procedure Laterality Date   CARDIOVERSION N/A 01/25/2016   Procedure: CARDIOVERSION;  Surgeon: Yates Decamp, MD;  Location: [facility] ENDOSCOPY;  Service: Cardiovascular;  Laterality: N/A;   COLONOSCOPY  02/02/2012   Procedure: COLONOSCOPY;  Surgeon: Theda Belfast, MD;  Location: [facility] ENDOSCOPY;  Service: Endoscopy;  Laterality: N/A;   HERNIA REPAIR     HYDROCELE EXCISION / REPAIR     I &  D EXTREMITY Left 11/24/2017   Procedure: IRRIGATION AND DEBRIDEMENT LEFT FOOT, REMOVAL OF TWO SCREWS, INSERTION OF ANTIBIOTIC BEADS;  Surgeon: Felecia Shelling, DPM;  Location: [facility] OR;  Service: Podiatry;  Laterality: Left;   JOINT REPLACEMENT     rt knee   ROTATOR CUFF REPAIR  rt   TOTAL HIP ARTHROPLASTY Right 07/14/2014   Procedure: RIGHT TOTAL HIP ARTHROPLASTY;  Surgeon: Durene Romans, MD;  Location: [facility] ORS;  Service: Orthopedics;  Laterality: Right;    Family History  Problem Relation Age of Onset   Diabetes type II Mother    Stroke Mother 38   High Cholesterol Mother    Prostate cancer Father 75    Social History:  reports that he has never smoked. He has never used smokeless tobacco. He reports that he does not drink alcohol and does not use drugs.   Review of Systems   Lipid history: He had muscle and joint pains with Lipitor and also with Crestor prescribed by PCP which he could not tolerate apparently  He had been on Repatha previously   Previously has been told a couple of times to try fluvastatin XL.  However he thinks he had some side effects which he does not remember and is not taking any medication He was given a trial injection of Repatha by the cardiologist but he  thinks it caused him to have headaches and high blood pressure and refuses to consider any more medications    Lab Results  Component Value Date   CHOL 221 (H) 01/21/2021   HDL 42.60 01/21/2021   LDLCALC 156 (H) 01/21/2021   TRIG 114.0 01/21/2021   CHOLHDL 5 01/21/2021           Hypertension: Has been on treatment, treatment includes benazepril 40 mg and amlodipine followed by PCP and cardiologist   BP Readings from Last 3 Encounters:  01/27/21 (!) 150/88  11/18/20 (!) 173/91  10/27/20 136/78   Has had variable renal dysfunction, creatinine levels as follows  Lab Results  Component Value Date   CREATININE 1.14 01/21/2021   CREATININE 1.33 (H) 10/07/2020   CREATININE 1.4 (A) 05/14/2019    Most recent eye exam was in 2022, no report available from Dr. Harriette Bouillon  Most recent foot exam: 4/22, also done by PCP  Currently known complications of diabetes: Mild neuropathy    LABS:  Office Visit on 01/27/2021  Component Date Value Ref Range Status   POC Glucose 01/27/2021 136 (A)  70 - 99 mg/dl Final  Lab on 16/12/9602  Component Date Value Ref Range Status   Cholesterol 01/21/2021 221 (A)  0 - 200 mg/dL Final   ATP III Classification       Desirable:  < 200 mg/dL               Borderline High:  200 - 239 mg/dL          High:  > = 540 mg/dL   Triglycerides 98/01/9146 114.0  0.0 - 149.0 mg/dL Final   Normal:  <829 mg/dLBorderline High:  150 - 199 mg/dL   HDL 56/21/3086 57.84  >39.00 mg/dL Final   VLDL 69/62/9528 22.8  0.0 - 40.0 mg/dL Final   LDL Cholesterol 01/21/2021 156 (A)  0 - 99 mg/dL Final   Total CHOL/HDL Ratio 01/21/2021 5   Final                  Men          Women1/2  Average Risk     3.4          3.3Average Risk          5.0          4.42X Average Risk          9.6          7.13X Average Risk          15.0          11.0                       NonHDL 01/21/2021 178.56   Final   NOTE:  Non-HDL goal should be 30 mg/dL higher than patient's LDL goal (i.e. LDL goal of  < 70 mg/dL, would have non-HDL goal of < 100 mg/dL)   Sodium 16/12/9602 540  135 - 145 mEq/L Final   Potassium 01/21/2021 4.4  3.5 - 5.1 mEq/L Final   Chloride 01/21/2021 103  96 - 112 mEq/L Final   CO2 01/21/2021 27  19 - 32 mEq/L Final   Glucose, Bld 01/21/2021 125 (A)  70 - 99 mg/dL Final   BUN 98/01/9146 16  6 - 23 mg/dL Final   Creatinine, Ser 01/21/2021 1.14  0.40 - 1.50 mg/dL Final   Total Bilirubin 01/21/2021 0.7  0.2 - 1.2 mg/dL Final   Alkaline Phosphatase 01/21/2021 81  39 - 117 U/L Final   AST 01/21/2021 16  0 - 37 U/L Final   ALT 01/21/2021 10  0 - 53 U/L Final   Total Protein 01/21/2021 7.7  6.0 - 8.3 g/dL Final   Albumin 82/95/6213 4.4  3.5 - 5.2 g/dL Final   GFR 08/65/7846 65.07  >60.00 mL/min Final   Calculated using the CKD-EPI Creatinine Equation (2021)   Calcium 01/21/2021 9.7  8.4 - 10.5 mg/dL Final   Hgb N6E MFr Bld 01/21/2021 7.2 (A)  4.6 - 6.5 % Final   Glycemic Control Guidelines for People with Diabetes:Non Diabetic:  <6%Goal of Therapy: <7%Additional Action Suggested:  >8%     Physical Examination:  BP (!) 150/88 (BP Location: Left Arm, Patient Position: Sitting, Cuff Size: Large)   Pulse 88   Ht 6' (1.829 m)   Wt (!) 355 lb 12.8 oz (161.4 kg)   SpO2 97%   BMI 48.26 kg/m             ASSESSMENT:  Diabetes type 2 with obesity  See history of present illness for detailed discussion of current diabetes management, blood sugar patterns and problems identified  His A1c is higher at 7.2  He has had mild diabetes with taking only small doses of premixed insulin twice a day  Recently blood sugars are worse because of going off his diet frequently Overall his meal planning and dietary habits are suboptimal including eating late evening meals He possibly can benefit from GLP-1 drugs but reportedly has had nausea from Trulicity before  Hyperlipidemia: He has a history of coronary disease but not on any lipid-lowering drugs Currently refuses to try any  medications   PLAN:   We will try to get him a nutritional consultation, he prefers a phone call Given him list of low saturated fat foods to try and follow Recommended that he eat a small breakfast and avoid late evening meals especially fast food He should preferably have a low-fat balanced meals with protein such as eggs and toast in the morning Start checking blood sugar 2 hours after meals  consistently to help him watch his diet better Consider trying a sample of Rybelsus if he is more open to additional medications on the next visit and if he has difficulty losing weight Needs to contact his PCP and cardiologist for continued high blood pressure readings May simply try Zetia if he is amenable to taking lipid-lowering medications on the next visit  Follow-up in 3 months  There are no Patient Instructions on file for this visit.    Total visit time including counseling = 30 minutes   Reather Littler 01/27/2021, 8:33 AM   Note: This office note was prepared with Dragon voice recognition system technology. Any transcriptional errors that result from this process are unintentional.

## 2021-04-11 ENCOUNTER — Ambulatory Visit: Payer: Medicare Other

## 2021-04-11 ENCOUNTER — Other Ambulatory Visit: Payer: Self-pay

## 2021-04-11 ENCOUNTER — Other Ambulatory Visit: Payer: Self-pay | Admitting: Endocrinology

## 2021-04-11 DIAGNOSIS — I5032 Chronic diastolic (congestive) heart failure: Secondary | ICD-10-CM

## 2021-04-11 DIAGNOSIS — I25118 Atherosclerotic heart disease of native coronary artery with other forms of angina pectoris: Secondary | ICD-10-CM

## 2021-04-21 ENCOUNTER — Ambulatory Visit: Payer: Medicare Other | Admitting: Cardiology

## 2021-05-05 ENCOUNTER — Other Ambulatory Visit: Payer: Self-pay

## 2021-05-05 ENCOUNTER — Ambulatory Visit (INDEPENDENT_AMBULATORY_CARE_PROVIDER_SITE_OTHER): Payer: 59 | Admitting: Endocrinology

## 2021-05-05 ENCOUNTER — Encounter: Payer: Self-pay | Admitting: Endocrinology

## 2021-05-05 VITALS — BP 158/82 | HR 50 | Ht 72.0 in | Wt 368.2 lb

## 2021-05-05 DIAGNOSIS — E782 Mixed hyperlipidemia: Secondary | ICD-10-CM | POA: Diagnosis not present

## 2021-05-05 DIAGNOSIS — Z794 Long term (current) use of insulin: Secondary | ICD-10-CM

## 2021-05-05 DIAGNOSIS — E1165 Type 2 diabetes mellitus with hyperglycemia: Secondary | ICD-10-CM | POA: Diagnosis not present

## 2021-05-05 LAB — POCT GLYCOSYLATED HEMOGLOBIN (HGB A1C): Hemoglobin A1C: 7.4 % — AB (ref 4.0–5.6)

## 2021-05-05 LAB — POCT GLUCOSE (DEVICE FOR HOME USE): Glucose Fasting, POC: 143 mg/dL — AB (ref 70–99)

## 2021-05-05 MED ORDER — EZETIMIBE 10 MG PO TABS: 10.0000 mg | ORAL_TABLET | Freq: Every day | ORAL | 3 refills | Status: DC

## 2021-05-05 NOTE — Progress Notes (Signed)
Patient ID: William Cameron, male   DOB: Oct 27, 1950, 71 y.o.   MRN: [id number]           Reason for Appointment: Follow-up   Referring PCP: Ralene Ok   History of Present Illness:          Date of diagnosis of type 2 diabetes mellitus:   Around year 2000      Background history:  Patient has very poor recall about his initial diagnosis and previous treatment Also no records available to review diabetes management and previous A1c results He does not know if he took metformin However in 2016 his A1c was 6% and he thinks that his blood sugars started going up only when he had staph infection in his foot in 7/19 when his A1c was 7.6  He apparently stopped [place] in 2020 because of high out-of-pocket expense Previously had tried Trulicity but apparently had nausea with this and not clear when this was stopped   Recent history:      INSULIN regimen: Humalog mix 12 units in a.m. and after dinner: 22 units  Non-insulin hypoglycemic drugs the patient is taking are: None  His A1c is 7.4 and progressively higher  Current management, blood sugar patterns and problems identified:  He did not bring his monitor for review again Apparently only checking his blood sugars on waking up Although he had been instructed to take his evening insulin before dinnertime he now says that he is doing this at bedtime  Also despite not eating breakfast and taking premixed insulin at bedtime he still does not report any hypoglycemic symptoms  He says because of various reasons and cold weather he has not been getting out and has been very sedentary  Over the holidays likely not following his diet and has gained another 13 pounds  Not clear what his blood sugars are at home as he did not bring his monitor or any recall of what his blood sugar has been, was about 130 today at home  He thinks he is taking his insulin doses regularly at morning time and bedtime Has previously refused to take medications such  as Farxiga other than insulin                  Side effects from diabetes medicines:?  Nausea from Trulicity   Glucose monitoring: Using One Touch Verio monitor No blood sugars available   Dietician visit, most recent: 12/20 CDE visit: 9/20  Weight history:  Wt Readings from Last 3 Encounters:  05/05/21 (!) 368 lb 3.2 oz (167 kg)  01/27/21 (!) 355 lb 12.8 oz (161.4 kg)  11/18/20 (!) 357 lb 6.4 oz (162.1 kg)    Glycemic control:   Lab Results  Component Value Date   HGBA1C 7.4 (A) 05/05/2021   HGBA1C 7.2 (H) 01/21/2021   HGBA1C 6.8 (A) 10/27/2020   Lab Results  Component Value Date   MICROALBUR 159 05/11/2020   LDLCALC 156 (H) 01/21/2021   CREATININE 1.14 01/21/2021   Lab Results  Component Value Date   MICRALBCREAT 8.5 02/23/2020    No results found for: FRUCTOSAMINE  Office Visit on 05/05/2021  Component Date Value Ref Range Status   Hemoglobin A1C 05/05/2021 7.4 (A)  4.0 - 5.6 % Final   Glucose Fasting, POC 05/05/2021 143 (A)  70 - 99 mg/dL Final    Allergies as of 05/05/2021       Reactions   Penicillins Anaphylaxis   Has patient had a PCN reaction  causing immediate rash, facial/tongue/throat swelling, SOB or lightheadedness with hypotension: Yes Has patient had a PCN reaction causing severe rash involving mucus membranes or skin necrosis: Yes Has patient had a PCN reaction that required hospitalization Yes Has patient had a PCN reaction occurring within the last 10 years: No If all of the above answers are "NO", then may proceed with Cephalosporin use.   Tetanus Toxoids Anaphylaxis, Shortness Of Breath, Swelling   Sulfa Antibiotics Nausea And Vomiting, Swelling   Statins    Trulicity [dulaglutide]    nausea        Medication List        Accurate as of May 05, 2021  8:55 AM. If you have any questions, ask your nurse or doctor.          STOP taking these medications    Repatha 140 MG/ML Sosy Generic drug: Evolocumab Stopped by: Elayne Snare, MD       TAKE these medications    amLODipine 10 MG tablet Commonly known as: NORVASC Take 1 tablet (10 mg total) by mouth daily.   apixaban 5 MG Tabs tablet Commonly known as: ELIQUIS Take 5 mg by mouth 2 (two) times daily.   benazepril 40 MG tablet Commonly known as: LOTENSIN Take 40 mg by mouth daily after supper.   ezetimibe 10 MG tablet Commonly known as: Zetia Take 1 tablet (10 mg total) by mouth daily. Started by: Elayne Snare, MD   Insulin Lispro Prot & Lispro (75-25) 100 UNIT/ML Kwikpen Commonly known as: HumaLOG Mix 75/25 KwikPen INJECT 12 UNITS SUBCUTANEOUSLY IN THE MORNING AND 22 UNITS IN THE EVENING What changed: additional instructions   metoprolol succinate 25 MG 24 hr tablet Commonly known as: TOPROL-XL TAKE 1 TABLET BY MOUTH ONCE DAILY **TAKE  WITH  OR  IMMEDIATELY  FOLLOWING  A  MEAL   NovoFine Plus 32G X 4 MM Misc Generic drug: Insulin Pen Needle Use to inject insulin twice daily.   OneTouch Verio test strip Generic drug: glucose blood USE 1 STRIP TO CHECK GLUCOSE TWICE DAILY        Allergies:  Allergies  Allergen Reactions   Penicillins Anaphylaxis    Has patient had a PCN reaction causing immediate rash, facial/tongue/throat swelling, SOB or lightheadedness with hypotension: Yes Has patient had a PCN reaction causing severe rash involving mucus membranes or skin necrosis: Yes Has patient had a PCN reaction that required hospitalization Yes Has patient had a PCN reaction occurring within the last 10 years: No If all of the above answers are "NO", then may proceed with Cephalosporin use.   Tetanus Toxoids Anaphylaxis, Shortness Of Breath and Swelling   Sulfa Antibiotics Nausea And Vomiting and Swelling   Statins    Trulicity [Dulaglutide]     nausea    Past Medical History:  Diagnosis Date   Atrial fibrillation (HCC)    Bronchitis    chronic bronchitis   COPD (chronic obstructive pulmonary disease) (HCC)    Diabetes mellitus  without complication (HCC)    Elevated lactic acid level    Hyperlipidemia    Hypertension    PONV (postoperative nausea and vomiting)    post op nausea   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    Shakes    Sleep apnea    cpap at night    Past Surgical History:  Procedure Laterality Date  CARDIOVERSION N/A 01/25/2016   Procedure: CARDIOVERSION;  Surgeon: Adrian Prows, MD;  Location: [facility] [place] END[facility]COPY;  Service: Cardiovascular;  Laterality: N/A;   COLONOSCOPY  02/02/2012   Procedure: COLONOSCOPY;  Surgeon: Beryle Beams, MD;  Location: [facility] ENDOSCOPY;  Service: Endoscopy;  Laterality: N/A;   HERNIA REPAIR     HYDROCELE EXCISION / REPAIR     I & D EXTREMITY Left 11/24/2017   Procedure: IRRIGATION AND DEBRIDEMENT LEFT FOOT, REMOVAL OF TWO SCREWS, INSERTION OF ANTIBIOTIC BEADS;  Surgeon: Edrick Kins, DPM;  Location: [facility];  Service: Podiatry;  Laterality: Left;   JOINT REPLACEMENT     rt knee   ROTATOR CUFF REPAIR  rt   TOTAL HIP ARTHROPLASTY Right 07/14/2014   Procedure: RIGHT TOTAL HIP ARTHROPLASTY;  Surgeon: Paralee Cancel, MD;  Location: [facility];  Service: Orthopedics;  Laterality: Right;    Family History  Problem Relation Age of Onset   Diabetes type II Mother    Stroke Mother 73   High Cholesterol Mother    Prostate cancer Father 79    Social History:  reports that he has never smoked. He has never used smokeless tobacco. He reports that he does not drink alcohol and does not use drugs.   Review of Systems   Lipid history: He had muscle and joint pains with Lipitor and also with Crestor prescribed by PCP which he could not tolerate apparently  He had been on Repatha previously   Previously has been told a couple of times to try fluvastatin XL.  However he thinks he had some side effects which he does not remember and is not taking any medication He was given  a trial injection of Repatha by the cardiologist but he thinks it caused him to have headaches and high blood pressure and refuses to consider any more medications    Lab Results  Component Value Date   CHOL 221 (H) 01/21/2021   HDL 42.60 01/21/2021   LDLCALC 156 (H) 01/21/2021   TRIG 114.0 01/21/2021   CHOLHDL 5 01/21/2021           Hypertension: Has been on treatment, treatment includes benazepril 40 mg and amlodipine followed by PCP and cardiologist He is anxious today and blood pressure is high  BP Readings from Last 3 Encounters:  05/05/21 (!) 158/82  01/27/21 (!) 150/88  11/18/20 (!) 173/91   Has had variable renal dysfunction, creatinine levels as follows  Lab Results  Component Value Date   CREATININE 1.14 01/21/2021   CREATININE 1.33 (H) 10/07/2020   CREATININE 1.4 (A) 05/14/2019    Most recent eye exam was in 2022, no report available from Dr. Einar Gip  Most recent foot exam: 4/22, also done by PCP  Currently known complications of diabetes: Mild neuropathy    LABS:  Office Visit on 05/05/2021  Component Date Value Ref Range Status   Hemoglobin A1C 05/05/2021 7.4 (A)  4.0 - 5.6 % Final   Glucose Fasting, POC 05/05/2021 143 (A)  70 - 99 mg/dL Final    Physical Examination:  BP (!) 158/82    Pulse (!) 50    Ht 6' (1.829 m)    Wt (!) 368 lb 3.2 oz (167 kg)    SpO2 95%    BMI 49.94 kg/m             ASSESSMENT:  Diabetes type 2 with severe obesity  See history of present illness for detailed discussion of current diabetes management, blood sugar patterns and  problems identified  His A1c is higher at 7.4  He has had mild diabetes, taking only small doses of premixed insulin twice a day He now appears to be taking his evening insulin at bedtime instead of before supper Explained the differences between premixed and long-acting insulin and the need to take the doses in the evening before starting to eat even if he is eating out He has gained weight  and has not been able to do any exercise or be consistent with diet As before he has been very reluctant to add any medications to his regimen that would improve his control Help his weight  Hyperlipidemia: He has a history of coronary disease but not on any lipid-lowering drugs He has been totally reluctant to try any medications at all However after much explanation and counseling regarding the need to reduce cardiovascular risk he is agreeing to start ZETIA, explained to him that this is only working to reduce cholesterol absorption and does not cause systemic side effects   PLAN:   He will take his evening insulin before dinner Start checking blood readings sometimes after his evening meal at least every other day Explained blood sugar targets at fasting and postprandial times  Also may consider trying a sample of Rybelsus if he is more open to additional medications in the future He will try to be more active and try to consistently reduce high calorie and high fat foods  He can at least try Zetia every other day after dinner to start with and then take every day if tolerated  Follow-up in 3 months  Patient Instructions  Take pm shot before supper  Check blood sugars on waking up   Also check blood sugars about 2 hours after meals and do this after different meals by rotation  Recommended blood sugar levels on waking up are 90-130 and about 2 hours after meal is 130-180  Please bring your blood sugar monitor to each visit, thank you  Take Ezetimibe with supper      Elayne Snare 05/05/2021, 8:55 AM   Note: This office note was prepared with Dragon voice recognition system technology. Any transcriptional errors that result from this process are unintentional.

## 2021-05-05 NOTE — Patient Instructions (Addendum)
Take pm shot before supper  Check blood sugars on waking up   Also check blood sugars about 2 hours after meals and do this after different meals by rotation  Recommended blood sugar levels on waking up are 90-130 and about 2 hours after meal is 130-180  Please bring your blood sugar monitor to each visit, thank you  Take Ezetimibe with supper

## 2021-06-01 NOTE — Progress Notes (Signed)
Labs 05/31/2021: ? ?Albumin to creatinine ratio 248 in the urine (<30/mg creatinine). ? ?Total cholesterol 233, triglycerides 187, HDL 43, LDL 156.  Non-HDL cholesterol 190. ? ?Serum glucose 126 mg, BUN 15, creatinine 1.06, EGFR 75 mill, potassium 4.3, LFTs normal. ? ?Hb 15.3/HCT 47.3, platelets 166, normal indicis.

## 2021-06-03 ENCOUNTER — Other Ambulatory Visit: Payer: Self-pay | Admitting: Endocrinology

## 2021-08-03 ENCOUNTER — Ambulatory Visit (INDEPENDENT_AMBULATORY_CARE_PROVIDER_SITE_OTHER): Payer: 59 | Admitting: Endocrinology

## 2021-08-03 ENCOUNTER — Encounter: Payer: Self-pay | Admitting: Endocrinology

## 2021-08-03 VITALS — BP 132/78 | HR 60 | Ht 72.0 in | Wt 342.4 lb

## 2021-08-03 DIAGNOSIS — E669 Obesity, unspecified: Secondary | ICD-10-CM

## 2021-08-03 DIAGNOSIS — E782 Mixed hyperlipidemia: Secondary | ICD-10-CM

## 2021-08-03 DIAGNOSIS — E1169 Type 2 diabetes mellitus with other specified complication: Secondary | ICD-10-CM

## 2021-08-03 LAB — COMPREHENSIVE METABOLIC PANEL
ALT: 18 U/L (ref 0–53)
AST: 27 U/L (ref 0–37)
Albumin: 4.7 g/dL (ref 3.5–5.2)
Alkaline Phosphatase: 67 U/L (ref 39–117)
BUN: 21 mg/dL (ref 6–23)
CO2: 21 mEq/L (ref 19–32)
Calcium: 9.8 mg/dL (ref 8.4–10.5)
Chloride: 100 mEq/L (ref 96–112)
Creatinine, Ser: 1.44 mg/dL (ref 0.40–1.50)
GFR: 48.98 mL/min — ABNORMAL LOW (ref >60.00)
Glucose, Bld: 135 mg/dL — ABNORMAL HIGH (ref 70–99)
Potassium: 4 mEq/L (ref 3.5–5.1)
Sodium: 135 mEq/L (ref 135–145)
Total Bilirubin: 0.7 mg/dL (ref 0.2–1.2)
Total Protein: 8 g/dL (ref 6.0–8.3)

## 2021-08-03 LAB — LIPID PANEL
Cholesterol: 205 mg/dL — ABNORMAL HIGH (ref 0–200)
HDL: 41.4 mg/dL (ref >39.00)
LDL Cholesterol: 142 mg/dL — ABNORMAL HIGH (ref 0–99)
NonHDL: 163.88
Total CHOL/HDL Ratio: 5
Triglycerides: 111 mg/dL (ref 0.0–149.0)
VLDL: 22.2 mg/dL (ref 0.0–40.0)

## 2021-08-03 LAB — POCT GLYCOSYLATED HEMOGLOBIN (HGB A1C): Hemoglobin A1C: 7 % — AB (ref 4.0–5.6)

## 2021-08-03 NOTE — Progress Notes (Signed)
Patient ID: William Cameron, male   DOB: 1950-12-30, 71 y.o.   MRN: [id number] ? ?       ? ? ?Reason for Appointment: Follow-up  ? ?Referring PCP: Ralene Ok ? ? ?History of Present Illness:  ?        ?Date of diagnosis of type 2 diabetes mellitus:   Around year 2000     ? ?Background history:  ?Patient has very poor recall about his initial diagnosis and previous treatment ?Also no records available to review diabetes management and previous A1c results ?He does not know if he took metformin ?However in 2016 his A1c was 6% and he thinks that his blood sugars started going up only when he had staph infection in his foot in 7/19 when his A1c was 7.6 ? ?He apparently stopped [place] in 2020 because of high out-of-pocket expense ?Previously had tried Trulicity but apparently had nausea with this and not clear when this was stopped ? ? ?Recent history:  ?    ?INSULIN regimen: none ? ?Non-insulin hypoglycemic drugs the patient is taking are: None ? ?His A1c is slightly better at 7% ? ?Current management, blood sugar patterns and problems identified: ? ?He did bring his monitor for review  ?Over the last 3 weeks or so he has been on a very low calorie diet from a facility in [place] along with some herbal supplements ?With this diet he thinks he has lost about 26 pounds ?Apparently he stopped his INSULIN a couple of days before he went on the diet  ?Also with his weight loss he has been feeling more energy and has less back pain and is starting to do more walking and motorcycling  ?Home GLUCOSE blood sugars are being checked about every other day and range between 82-134 with AVERAGE only 106  ? ?                 ?Side effects from diabetes medicines:?  Nausea from Trulicity ? ? ?Glucose monitoring: Using One Touch Verio monitor ? ?Blood sugars as above ?Previously ? ?PRE-MEAL Fasting Lunch Dinner Bedtime Overall  ?Glucose range: 82-115    82-134  ?Mean/median:     106  ? ?POST-MEAL PC Breakfast PC Lunch PC Dinner   ?Glucose range:     ?Mean/median:     ? ? ?Dietician visit, most recent: 12/20 ?CDE visit: 9/20 ? ?Weight history: ? ?Wt Readings from Last 3 Encounters:  ?08/03/21 (!) 342 lb 6.4 oz (155.3 kg)  ?05/05/21 (!) 368 lb 3.2 oz (167 kg)  ?01/27/21 (!) 355 lb 12.8 oz (161.4 kg)  ? ? ?Glycemic control: ?  ?Lab Results  ?Component Value Date  ? HGBA1C 7.4 (A) 05/05/2021  ? HGBA1C 7.2 (H) 01/21/2021  ? HGBA1C 6.8 (A) 10/27/2020  ? ?Lab Results  ?Component Value Date  ? MICROALBUR 159 05/11/2020  ? LDLCALC 156 (H) 01/21/2021  ? CREATININE 1.14 01/21/2021  ? ?Lab Results  ?Component Value Date  ? MICRALBCREAT 8.5 02/23/2020  ? ? ?No results found for: FRUCTOSAMINE ? ?No visits with results within 1 Week(s) from this visit.  ?Latest known visit with results is:  ?Office Visit on 05/05/2021  ?Component Date Value Ref Range Status  ? Hemoglobin A1C 05/05/2021 7.4 (A)  4.0 - 5.6 % Final  ? Glucose Fasting, POC 05/05/2021 143 (A)  70 - 99 mg/dL Final  ? ? ?Allergies as of 08/03/2021   ? ?   Reactions  ? Penicillins Anaphylaxis  ? Has  patient had a PCN reaction causing immediate rash, facial/tongue/throat swelling, SOB or lightheadedness with hypotension: Yes ?Has patient had a PCN reaction causing severe rash involving mucus membranes or skin necrosis: Yes ?Has patient had a PCN reaction that required hospitalization Yes ?Has patient had a PCN reaction occurring within the last 10 years: No ?If all of the above answers are "NO", then may proceed with Cephalosporin use.  ? Tetanus Toxoids Anaphylaxis, Shortness Of Breath, Swelling  ? Sulfa Antibiotics Nausea And Vomiting, Swelling  ? Statins   ? Trulicity [dulaglutide]   ? nausea  ? ?  ? ?  ?Medication List  ?  ? ?  ? Accurate as of Aug 03, 2021  8:29 AM. If you have any questions, ask your nurse or doctor.  ?  ?  ? ?  ? ?amLODipine 10 MG tablet ?Commonly known as: NORVASC ?Take 1 tablet (10 mg total) by mouth daily. ?  ?apixaban 5 MG Tabs tablet ?Commonly known as: ELIQUIS ?Take 5  mg by mouth 2 (two) times daily. ?  ?benazepril 40 MG tablet ?Commonly known as: LOTENSIN ?Take 40 mg by mouth daily after supper. ?  ?ezetimibe 10 MG tablet ?Commonly known as: Zetia ?Take 1 tablet (10 mg total) by mouth daily. ?  ?Insulin Lispro Prot & Lispro (75-25) 100 UNIT/ML Kwikpen ?Commonly known as: HumaLOG Mix 75/25 KwikPen ?INJECT 12 UNITS SUBCUTANEOUSLY IN THE MORNING AND 22 UNITS IN THE EVENING ?  ?metoprolol succinate 25 MG 24 hr tablet ?Commonly known as: TOPROL-XL ?TAKE 1 TABLET BY MOUTH ONCE DAILY **TAKE  WITH  OR  IMMEDIATELY  FOLLOWING  A  MEAL ?  ?NovoFine Plus 32G X 4 MM Misc ?Generic drug: Insulin Pen Needle ?Use to inject insulin twice daily. ?  ?OneTouch Verio test strip ?Generic drug: glucose blood ?USE 1 STRIP TO CHECK GLUCOSE TWICE DAILY ?  ? ?  ? ? ?Allergies:  ?Allergies  ?Allergen Reactions  ? Penicillins Anaphylaxis  ?  Has patient had a PCN reaction causing immediate rash, facial/tongue/throat swelling, SOB or lightheadedness with hypotension: Yes ?Has patient had a PCN reaction causing severe rash involving mucus membranes or skin necrosis: Yes ?Has patient had a PCN reaction that required hospitalization Yes ?Has patient had a PCN reaction occurring within the last 10 years: No ?If all of the above answers are "NO", then may proceed with Cephalosporin use.  ? Tetanus Toxoids Anaphylaxis, Shortness Of Breath and Swelling  ? Sulfa Antibiotics Nausea And Vomiting and Swelling  ? Statins   ? Trulicity [Dulaglutide]   ?  nausea  ? ? ?Past Medical History:  ?Diagnosis Date  ? Atrial fibrillation (HCC)   ? Bronchitis   ? chronic bronchitis  ? COPD (chronic obstructive pulmonary disease) (HCC)   ? Diabetes mellitus without complication (HCC)   ? Elevated lactic acid level   ? Hyperlipidemia   ? Hypertension   ? PONV (postoperative nausea and vomiting)   ? post op nausea  ? Severe obesity (BMI >= 40) (HCC) 09/03/2013  ? 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   ? Shakes   ? Sleep apnea   ? cpap at night  ? ? ?Past Surgical History:  ?Procedure Laterality Date  ? CARDIOVERSION N/A 01/25/2016  ? Procedure: CARDIOVERSION;  Surgeon: Yates DecampJay Ganji, MD;  Location[facility];  Service: Cardiovascular;  Laterality: N/A;  ? COLONOSCOPY  02/02/2012  ? Procedure: COLONOSCOPY;  Surgeon: Luisa HartPatrick  Jamelle Haring, MD;  Location: [facility];  Service: Endoscopy;  Laterality: N/A;  ? HERNIA REPAIR    ? HYDROCELE EXCISION / REPAIR    ? I & D EXTREMITY Left 11/24/2017  ? Procedure: IRRIGATION AND DEBRIDEMENT LEFT FOOT, REMOVAL OF TWO SCREWS, INSERTION OF ANTIBIOTIC BEADS;  Surgeon: Felecia Shelling, DPM;  Location: [facility];  Service: Podiatry;  Laterality: Left;  ? JOINT REPLACEMENT    ? rt knee  ? ROTATOR CUFF REPAIR  rt  ? TOTAL HIP ARTHROPLASTY Right 07/14/2014  ? Procedure: RIGHT TOTAL HIP ARTHROPLASTY;  Surgeon: Durene Romans, MD;  Location: [facility];  Service: Orthopedics;  Laterality: Right;  ? ? ?Family History  ?Problem Relation Age of Onset  ? Diabetes type II Mother   ? Stroke Mother 31  ? High Cholesterol Mother   ? Prostate cancer Father 43  ? ? ?Social History:  reports that he has never smoked. He has never used smokeless tobacco. He reports that he does not drink alcohol and does not use drugs. ? ? ?Review of Systems ? ? ?Lipid history: He had muscle and joint pains with Lipitor and also with Crestor prescribed by PCP which he could not tolerate apparently ? ?He had been on Repatha previously  ? ?Previously has been told a couple of times to try fluvastatin XL.  However he thinks he had some side effects which he does not remember and is not taking any medication ?He was given a trial injection of Repatha by the cardiologist but he thinks it caused him to have headaches and high blood pressure and refuses to consider any more medications ? ?He is not taking the Zetia that was recommended also ?  ?Lab Results  ?Component Value Date  ? CHOL 221 (H)  01/21/2021  ? CHOL 226 (H) 10/20/2020  ? CHOL 188 05/11/2020  ? ?Lab Results  ?Component Value Date  ? HDL 42.60 01/21/2021  ? HDL 44 10/20/2020  ? HDL 46 05/11/2020  ? ?Lab Results  ?Component Value Date  ? LD

## 2021-08-12 ENCOUNTER — Other Ambulatory Visit: Payer: Self-pay

## 2021-08-12 DIAGNOSIS — E1169 Type 2 diabetes mellitus with other specified complication: Secondary | ICD-10-CM

## 2021-08-12 MED ORDER — ONETOUCH VERIO VI STRP: ORAL_STRIP | 2 refills | Status: DC

## 2021-08-23 ENCOUNTER — Ambulatory Visit
Admission: RE | Admit: 2021-08-23 | Discharge: 2021-08-23 | Disposition: A | Discharge status: Home / Self Care | Payer: Medicare Other | Source: Ambulatory Visit | Attending: Internal Medicine | Admitting: Internal Medicine

## 2021-08-23 ENCOUNTER — Other Ambulatory Visit: Payer: Self-pay | Admitting: Internal Medicine

## 2021-08-23 DIAGNOSIS — R059 Cough, unspecified: Secondary | ICD-10-CM

## 2021-08-23 DIAGNOSIS — R0602 Shortness of breath: Secondary | ICD-10-CM

## 2021-11-04 ENCOUNTER — Ambulatory Visit: Payer: Medicare Other | Admitting: Endocrinology

## 2021-11-04 ENCOUNTER — Encounter: Payer: Self-pay | Admitting: Endocrinology

## 2021-11-04 VITALS — BP 154/84 | HR 45 | Ht 72.0 in | Wt 342.2 lb

## 2021-11-04 DIAGNOSIS — E669 Obesity, unspecified: Secondary | ICD-10-CM | POA: Diagnosis not present

## 2021-11-04 DIAGNOSIS — E1169 Type 2 diabetes mellitus with other specified complication: Secondary | ICD-10-CM | POA: Diagnosis not present

## 2021-11-04 LAB — POCT GLYCOSYLATED HEMOGLOBIN (HGB A1C): Hemoglobin A1C: 7.4 % — AB (ref 4.0–5.6)

## 2021-11-04 LAB — POCT GLUCOSE (DEVICE FOR HOME USE): POC Glucose: 137 mg/dl — AB (ref 70–99)

## 2021-11-04 NOTE — Patient Instructions (Addendum)
Check blood sugars on waking up 2-3 days a week  Also check blood sugars about 2 hours after meals and do this after different meals by rotation  Recommended blood sugar levels on waking up are 90-130 and about 2 hours after meal is 130-160  Please bring your blood sugar monitor to each visit, thank you  See Dr Judie Petit for BP

## 2021-11-04 NOTE — Progress Notes (Signed)
Patient ID: William Cameron, male   DOB: [birth year], 71 y.o.   MRN: [id number]           Reason for Appointment: Follow-up   Referring PCP: Ralene Ok   History of Present Illness:          Date of diagnosis of type 2 diabetes mellitus:   Around year 2000      Background history:  Patient has very poor recall about his initial diagnosis and previous treatment Also no records available to review diabetes management and previous A1c results He does not know if he took metformin However in 2016 his A1c was 6% and he thinks that his blood sugars started going up only when he had staph infection in his foot in 7/19 when his A1c was 7.6  He apparently stopped [place] in 2020 because of high out-of-pocket expense Previously had tried Trulicity but apparently had nausea with this and not clear when this was stopped   Recent history:      INSULIN regimen: none  Non-insulin hypoglycemic drugs the patient is taking are: None  His A1c is 7.4, previously was at 7%  Current management, blood sugar patterns and problems identified:  He did bring his monitor again and has not checked his blood sugar in several weeks Also he had lost a lot of weight previously and blood sugars are normal without any medications his A1c is increasing  Unclear when his blood sugars are high However he thinks that he is having a lot of celebrations and eating a lot of sweets and not watching his diet consistently Otherwise still trying to eat small portions and carbohydrates when he is not eating and company  Has maintained his weight  This is likely to be from his being much more active than usual where he was having back pain  Not doing much formal exercise  He has not taken any insulin and still refuses to take any Blood sugar after just eating 1 slice of toast today was 137 in the office           Side effects from diabetes medicines:?  Nausea from Trulicity   Glucose monitoring: Using One Touch Verio  monitor  Blood sugars not available   Dietician visit, most recent: 12/20 CDE visit: 9/20  Weight history:  Wt Readings from Last 3 Encounters:  11/04/21 (!) 342 lb 3.2 oz (155.2 kg)  08/03/21 (!) 342 lb 6.4 oz (155.3 kg)  05/05/21 (!) 368 lb 3.2 oz (167 kg)    Glycemic control:   Lab Results  Component Value Date   HGBA1C 7.4 (A) 11/04/2021   HGBA1C 7.0 (A) 08/03/2021   HGBA1C 7.4 (A) 05/05/2021   Lab Results  Component Value Date   MICROALBUR 159 05/11/2020   LDLCALC 142 (H) 08/03/2021   CREATININE 1.44 08/03/2021   Lab Results  Component Value Date   MICRALBCREAT 8.5 02/23/2020    No results found for: "FRUCTOSAMINE"  Office Visit on 11/04/2021  Component Date Value Ref Range Status   Hemoglobin A1C 11/04/2021 7.4 (A)  4.0 - 5.6 % Final   POC Glucose 11/04/2021 137 (A)  70 - 99 mg/dl Final    Allergies as of 11/04/2021       Reactions   Penicillins Anaphylaxis   Has patient had a PCN reaction causing immediate rash, facial/tongue/throat swelling, SOB or lightheadedness with hypotension: Yes Has patient had a PCN reaction causing severe rash involving mucus membranes or skin necrosis: Yes Has patient  had a PCN reaction that required hospitalization Yes Has patient had a PCN reaction occurring within the last 10 years: No If all of the above answers are "NO", then may proceed with Cephalosporin use.   Tetanus Toxoids Anaphylaxis, Shortness Of Breath, Swelling   Sulfa Antibiotics Nausea And Vomiting, Swelling   Statins    Trulicity [dulaglutide]    nausea        Medication List        Accurate as of November 04, 2021  5:51 PM. If you have any questions, ask your nurse or doctor.          amLODipine 10 MG tablet Commonly known as: NORVASC Take 1 tablet (10 mg total) by mouth daily.   apixaban 5 MG Tabs tablet Commonly known as: ELIQUIS Take 5 mg by mouth 2 (two) times daily.   benazepril 40 MG tablet Commonly known as: LOTENSIN Take 40 mg  by mouth daily after supper.   ezetimibe 10 MG tablet Commonly known as: Zetia Take 1 tablet (10 mg total) by mouth daily.   Insulin Lispro Prot & Lispro (75-25) 100 UNIT/ML Kwikpen Commonly known as: HumaLOG Mix 75/25 KwikPen INJECT 12 UNITS SUBCUTANEOUSLY IN THE MORNING AND 22 UNITS IN THE EVENING   metoprolol succinate 25 MG 24 hr tablet Commonly known as: TOPROL-XL TAKE 1 TABLET BY MOUTH ONCE DAILY **TAKE  WITH  OR  IMMEDIATELY  FOLLOWING  A  MEAL   NovoFine Plus 32G X 4 MM Misc Generic drug: Insulin Pen Needle Use to inject insulin twice daily.   OneTouch Verio test strip Generic drug: glucose blood USE 1 STRIP TO CHECK GLUCOSE TWICE DAILY        Allergies:  Allergies  Allergen Reactions   Penicillins Anaphylaxis    Has patient had a PCN reaction causing immediate rash, facial/tongue/throat swelling, SOB or lightheadedness with hypotension: Yes Has patient had a PCN reaction causing severe rash involving mucus membranes or skin necrosis: Yes Has patient had a PCN reaction that required hospitalization Yes Has patient had a PCN reaction occurring within the last 10 years: No If all of the above answers are "NO", then may proceed with Cephalosporin use.   Tetanus Toxoids Anaphylaxis, Shortness Of Breath and Swelling   Sulfa Antibiotics Nausea And Vomiting and Swelling   Statins    Trulicity [Dulaglutide]     nausea    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    Shakes    Sleep apnea    cpap at night    Past  Surgical History:  Procedure Laterality Date   CARDIOVERSION N/A 01/25/2016   Procedure: CARDIOVERSION;  Surgeon: Yates Decamp, MD;  Location: [facility] ENDOSCOPY;  Service: Cardiovascular;  Laterality: N/A;   COLONOSCOPY  02/02/2012   Procedure: COLONOSCOPY;  Surgeon: Theda Belfast, MD;  Location: [facility] ENDOSCOPY;  Service: Endoscopy;  Laterality: N/A;   HERNIA REPAIR     HYDROCELE EXCISION / REPAIR     I & D EXTREMITY Left  11/24/2017   Procedure: IRRIGATION AND DEBRIDEMENT LEFT FOOT, REMOVAL OF TWO SCREWS, INSERTION OF ANTIBIOTIC BEADS;  Surgeon: Felecia Shelling, DPM;  Location: [facility] OR;  Service: Podiatry;  Laterality: Left;   JOINT REPLACEMENT     rt knee   ROTATOR CUFF REPAIR  rt   TOTAL HIP ARTHROPLASTY Right 07/14/2014   Procedure: RIGHT TOTAL HIP ARTHROPLASTY;  Surgeon: Durene Romans, MD;  Location: [facility] ORS;  Service: Orthopedics;  Laterality: Right;    Family History  Problem Relation Age of Onset   Diabetes type II Mother    Stroke Mother 11   High Cholesterol Mother    Prostate cancer Father 67    Social History:  reports that he has never smoked. He has never used smokeless tobacco. He reports that he does not drink alcohol and does not use drugs.   Review of Systems   Lipid history: He had muscle and joint pains with Lipitor and also with Crestor prescribed by PCP which he could not tolerate apparently  He had been on Repatha previously   Previously has been told a couple of times to try fluvastatin XL.  However he thinks he had some side effects which he does not remember and is not taking any medication He was given a trial injection of Repatha by the cardiologist but he thinks it caused him to have headaches and high blood pressure and refuses to consider any more medications  He is not taking the Zetia that was recommended also   Lab Results  Component Value Date   CHOL 205 (H) 08/03/2021   CHOL 221 (H) 01/21/2021   CHOL 226 (H) 10/20/2020   Lab Results  Component Value  Date   HDL 41.40 08/03/2021   HDL 42.60 01/21/2021   HDL 44 10/20/2020   Lab Results  Component Value Date   LDLCALC 142 (H) 08/03/2021   LDLCALC 156 (H) 01/21/2021   LDLCALC 159 (H) 10/20/2020   Lab Results  Component Value Date   TRIG 111.0 08/03/2021   TRIG 114.0 01/21/2021   TRIG 128 10/20/2020   Lab Results  Component Value Date   CHOLHDL 5 08/03/2021   CHOLHDL 5 01/21/2021   CHOLHDL 5.1 (H) 10/20/2020   No results found for: "LDLDIRECT"          Hypertension: Has been on treatment, treatment includes benazepril 40 mg and amlodipine followed by PCP and cardiologist  BP Readings from Last 3 Encounters:  11/04/21 (!) 154/84  08/03/21 132/78  05/05/21 (!) 158/82   Has had variable renal dysfunction, creatinine levels as follows Reports of labs done by PCP not available  Lab Results  Component Value Date   CREATININE 1.44 08/03/2021   CREATININE 1.14 01/21/2021   CREATININE 1.33 (H) 10/07/2020    Most recent eye exam was in 2022, no report available from Dr. Harriette Bouillon  Most recent foot exam: 4/22, also done by PCP  Currently known complications of diabetes: Mild neuropathy   LABS:  Office Visit on 11/04/2021  Component Date Value Ref Range Status   Hemoglobin A1C 11/04/2021 7.4 (A)  4.0 - 5.6 % Final   POC Glucose 11/04/2021 137 (A)  70 - 99 mg/dl Final    Physical Examination:  BP (!) 154/84   Pulse (!) 45   Ht 6' (1.829 m)   Wt (!) 342 lb 3.2 oz (155.2 kg)   SpO2 95%   BMI 46.41 kg/m           ASSESSMENT:  Diabetes type 2 with severe obesity  See history of present illness for detailed discussion of current diabetes management, blood sugar patterns and problems identified  His A1c is 7.4 compared to 7  He has had mild diabetes with severe obesity  Although he has not lost any further weight he is not able to comply with his diet Previously even with very low calorie diet his A1c was only 7  Difficult to assess his blood sugars  because of lack of glucose monitoring   Hyperlipidemia: Untreated and he still refuses to consider any treatment  PLAN:   Discussed need to restart checking blood sugars consistently including after meals He will try to cut back on his diet with avoiding sweets and high fat foods To call if blood sugars are consistently over 130 fasting or 180 after meals We will either consider restarting his insulin or trying Marcelline Deist if he has consistently high A1c or blood sugars  He will follow-up with his PCP or cardiologist regarding blood pressure management  Also to discuss lipid management with PCP and cardiologist, as before he is refusing to take any medications  Follow-up in 3 months  Patient Instructions  Check blood sugars on waking up 2-3 days a week  Also check blood sugars about 2 hours after meals and do this after different meals by rotation  Recommended blood sugar levels on waking up are 90-130 and about 2 hours after meal is 130-160  Please bring your blood sugar monitor to each visit, thank you  See Dr Judie Petit for BP       Reather Littler 11/04/2021, 5:51 PM   Note: This office note was prepared with Dragon voice recognition system technology. Any transcriptional errors that result from this process are unintentional.

## 2021-12-10 ENCOUNTER — Other Ambulatory Visit: Payer: Self-pay | Admitting: Cardiology

## 2021-12-10 DIAGNOSIS — I10 Essential (primary) hypertension: Secondary | ICD-10-CM

## 2021-12-13 ENCOUNTER — Other Ambulatory Visit: Payer: Self-pay

## 2021-12-13 DIAGNOSIS — E1169 Type 2 diabetes mellitus with other specified complication: Secondary | ICD-10-CM

## 2021-12-13 MED ORDER — ONETOUCH VERIO VI STRP: ORAL_STRIP | 2 refills | Status: DC

## 2021-12-19 ENCOUNTER — Other Ambulatory Visit: Payer: Self-pay | Admitting: Cardiology

## 2021-12-19 DIAGNOSIS — I10 Essential (primary) hypertension: Secondary | ICD-10-CM

## 2021-12-27 ENCOUNTER — Other Ambulatory Visit: Payer: Self-pay

## 2021-12-27 DIAGNOSIS — I10 Essential (primary) hypertension: Secondary | ICD-10-CM

## 2021-12-27 MED ORDER — AMLODIPINE BESYLATE 10 MG PO TABS: 10.0000 mg | ORAL_TABLET | Freq: Every day | ORAL | 0 refills | Status: DC

## 2021-12-30 ENCOUNTER — Ambulatory Visit: Payer: Medicare Other | Admitting: Cardiology

## 2021-12-30 ENCOUNTER — Encounter: Payer: Self-pay | Admitting: Cardiology

## 2021-12-30 VITALS — BP 142/82 | HR 67 | Temp 98.1°F | Resp 16 | Ht 72.0 in | Wt 349.8 lb

## 2021-12-30 DIAGNOSIS — M609 Myositis, unspecified: Secondary | ICD-10-CM

## 2021-12-30 DIAGNOSIS — E782 Mixed hyperlipidemia: Secondary | ICD-10-CM

## 2021-12-30 DIAGNOSIS — I10 Essential (primary) hypertension: Secondary | ICD-10-CM

## 2021-12-30 DIAGNOSIS — I4821 Permanent atrial fibrillation: Secondary | ICD-10-CM

## 2021-12-30 DIAGNOSIS — I25118 Atherosclerotic heart disease of native coronary artery with other forms of angina pectoris: Secondary | ICD-10-CM

## 2021-12-30 MED ORDER — AMLODIPINE BESYLATE 10 MG PO TABS: 10.0000 mg | ORAL_TABLET | Freq: Every evening | ORAL | 3 refills | Status: DC

## 2021-12-30 MED ORDER — OLMESARTAN MEDOXOMIL-HCTZ 40-25 MG PO TABS: 1.0000 | ORAL_TABLET | ORAL | 3 refills | Status: DC

## 2021-12-30 NOTE — Progress Notes (Signed)
Primary Physician/Referring:  Jilda Panda, MD  Patient ID: William Cameron, male    DOB: 04/08/1950, 71 y.o.   MRN: [id number]  Chief Complaint  Patient presents with   Coronary Artery Disease   Follow-up    HPI: William Cameron  is a 71 y.o. male male  with permanent atril fibrillation, hyperlipidemia with intolerance to statins, uncontrolled diabetes mellitus with stage 3a CKD, hypertension, morbid obesity with obstructive sleep apnea does not use CPAP regularly follows Dr. Brett Fairy, and severe 3 vessel CAD by coronary angiogram in Nov 2017 and recommended medical therapy due to diffuse disease.    Patient presents here for 61-monthoffice visit.  States that he has discontinued taking his diabetes medicines as his blood sugars have been well controlled.  He now has a new girlfriend as his wife passed away about 2 to 3 years ago.  She is present here today.  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  States that he has not had any worsening dyspnea, no chest pain, no palpitations, no dizziness or syncope.  He has not had any leg edema.  He is tolerating anticoagulation well.   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    Shakes    Sleep apnea    cpap at night    Past Surgical History:  Procedure Laterality Date   CARDIOVERSION N/A 01/25/2016   Procedure: CARDIOVERSION;  Surgeon: JAdrian Prows MD;  [facility]  Service: Cardiovascular;  Laterality: N/A;    COLONOSCOPY  02/02/2012   Procedure: COLONOSCOPY;  Surgeon: PBeryle Beams MD;  Location: [facility] ENDOSCOPY;  Service: Endoscopy;  Laterality: N/A;   HERNIA REPAIR     HYDROCELE EXCISION / REPAIR     I & D EXTREMITY Left 11/24/2017   Procedure: IRRIGATION AND DEBRIDEMENT LEFT FOOT, REMOVAL OF TWO SCREWS, INSERTION OF ANTIBIOTIC BEADS;  Surgeon: EEdrick Kins DPM;  [facility]  Service: Podiatry;  Laterality: Left;   JOINT REPLACEMENT     rt knee   ROTATOR CUFF REPAIR  rt   TOTAL HIP ARTHROPLASTY Right 07/14/2014   Procedure: RIGHT TOTAL HIP ARTHROPLASTY;  Surgeon: MParalee Cancel MD;  Location: [facility];  Service: Orthopedics;  Laterality: Right;   Social History   Tobacco Use   Smoking status: Never   Smokeless tobacco: Never  Substance Use Topics   Alcohol use: No  marital Status: Widowed    Current Outpatient Medications:    apixaban (ELIQUIS) 5 MG TABS tablet, Take 5 mg by mouth 2 (two) times daily., Disp: , Rfl:    glucose blood (ONETOUCH VERIO) test strip, USE 1 STRIP TO CHECK GLUCOSE TWICE DAILY, Disp: 100 each, Rfl: 2   olmesartan-hydrochlorothiazide (BENICAR HCT) 40-25 MG tablet, Take 1 tablet by mouth every morning., Disp: 90 tablet, Rfl: 3   amLODipine (NORVASC) 10 MG tablet, Take 1 tablet (10 mg total) by  mouth every evening., Disp: 90 tablet, Rfl: 3   Review of Systems  Cardiovascular:  Positive for dyspnea on exertion. Negative for chest pain and leg swelling.  Respiratory:  Positive for snoring.    Objective  Blood pressure (!) 142/82, pulse 67, temperature 98.1 F (36.7 C), temperature source Temporal, resp. rate 16, height 6' (1.829 m), weight (!) 349 lb 12.8 oz (158.7 kg), SpO2 95 %. Body mass index is 47.44 kg/m.      12/30/2021    9:20 AM 11/04/2021    8:30 AM 08/03/2021    8:23 AM  Vitals with BMI  Height 6' 0"  6' 0"  6' 0"   Weight 349 lbs 13 oz 342 lbs 3 oz 342 lbs 6 oz  BMI 47.43 20.2 54.27  Systolic 062 376 283  Diastolic 82 84 78  Pulse 67 45 60      Physical Exam Constitutional:      Appearance: He is well-developed.     Comments: Morbidly obese  HENT:     Head: Atraumatic.  Neck:     Vascular: No carotid bruit or JVD.     Comments: Short neck and difficult to evaluate JVP Cardiovascular:     Rate and Rhythm: Normal rate. Rhythm irregular.     Pulses:          Dorsalis pedis pulses are 2+ on the right side and 2+ on the left side.       Posterior tibial pulses are 2+ on the right side and 2+ on the left side.     Heart sounds: Normal heart sounds. No murmur heard.    No gallop.  Pulmonary:     Effort: Pulmonary effort is normal.     Breath sounds: Normal breath sounds.  Abdominal:     General: Bowel sounds are normal.     Palpations: Abdomen is soft.     Comments: Obese. Pannus present  Musculoskeletal:     Right lower leg: No edema.     Left lower leg: No edema.    Radiology:   Laboratory examination:   Lab Results  Component Value Date   NA 135 08/03/2021   K 4.0 08/03/2021   CO2 21 08/03/2021   GLUCOSE 135 (H) 08/03/2021   BUN 21 08/03/2021   CREATININE 1.44 08/03/2021   CALCIUM 9.8 08/03/2021   EGFR 58 (L) 10/07/2020   GFRNONAA 52 05/14/2019        Latest Ref Rng & Units 08/03/2021    8:50 AM 01/21/2021    8:24 AM 10/07/2020    3:09 PM  CMP  Glucose 70 - 99 mg/dL 135  125  102   BUN 6 - 23 mg/dL 21  16  14    Creatinine 0.40 - 1.50 mg/dL 1.44  1.14  1.33   Sodium 135 - 145 mEq/L 135  138  141   Potassium 3.5 - 5.1 mEq/L 4.0  4.4  4.5   Chloride 96 - 112 mEq/L 100  103  98   CO2 19 - 32 mEq/L 21  27  21    Calcium 8.4 - 10.5 mg/dL 9.8  9.7  10.1   Total Protein 6.0 - 8.3 g/dL 8.0  7.7    Total Bilirubin 0.2 - 1.2 mg/dL 0.7  0.7    Alkaline Phos 39 - 117 U/L 67  81    AST 0 - 37 U/L 27  16    ALT 0 - 53 U/L 18  10  Latest Ref Rng & Units 11/27/2017    4:36 AM 11/26/2017    7:51 AM 11/25/2017    5:52 AM  CBC  WBC 4.0 - 10.5 K/uL 10.1  7.4  9.5   Hemoglobin 13.0 - 17.0 g/dL 13.5  12.4  12.4    Hematocrit 39.0 - 52.0 % 43.6  39.9  39.5   Platelets 150 - 400 K/uL 169  165  199    Lipid Panel Recent Labs    01/21/21 0824 08/03/21 0850  CHOL 221* 205*  TRIG 114.0 111.0  LDLCALC 156* 142*  VLDL 22.8 22.2  HDL 42.60 41.40  CHOLHDL 5 5     HEMOGLOBIN A1C Lab Results  Component Value Date   HGBA1C 7.4 (A) 11/04/2021   MPG 171.42 10/08/2017   Lab Results  Component Value Date   TSH 1.01 05/11/2020   External labs:  Labs 05/31/2021:  Albumin to creatinine ratio 248 in the urine (<30/mg creatinine).  Total cholesterol 233, triglycerides 187, HDL 43, LDL 156.  Non-HDL cholesterol 190.  Serum glucose 126 mg, BUN 15, creatinine 1.06, EGFR 75 ml, potassium 4.3, LFTs normal.  Hb 15.3/HCT 47.3, platelets 166, normal indicis. Cardiac Studies:   Direct current cardioversion 01/25/2016: 120 x1, 150x1 to NSR.  Exercise sestamibi stress test 12/27/2015: 1. Two day protocol followed. The resting electrocardiogram demonstrated normal sinus rhythm, incomplete RBBB and no resting arrhythmias.  Low voltage and  poor R progression. The stress electrocardiogram was normal. Patient exercised on Bruce protocol for 4:13 minutes and achieved  4.84 METS. Stress test terminated due to % MPHR achieved (Target HR >85%). Symptoms included dyspnea. 2. The perfusion imaging study demonstrates diaphragmatic attenuation artifact in the inferior wall. There is no demonstrable ischemia or scar. LV systolic function was normal at 64%. This is a low risk study. 01/24/2016: Impression: EKG 11/13/2017: Atrial fibrillation with controlled ventricular response at the rate of 66 bpm, leftward axis, poor R-wave progression, cannot exclude anterolateral infarct old. Low-voltage complexes. Nonspecific T abnormality. No significant change from EKG 11/15/2016.  Coronary angiogram 01/24/2016: Mid LAD occluded, D2 small to moderate sized proximal 80%. Mid Cx occluded with moderate OM1 which bifurcates. Both calcific  CTO. RCA PL 1 has 80% stenosis (small) and PL3 occluded, mod-large vessel. LVEF 50-55% with apical akinesis.   PCV ECHOCARDIOGRAM COMPLETE 04/11/2021  Narrative Echocardiogram 04/11/2021: Normal LV systolic function with EF 62%. Left ventricle cavity is normal in size. Mild concentric remodeling of the left ventricle. Normal global wall motion. Doppler evidence of grade I (impaired) diastolic dysfunction, normal LAP. Calculated EF 62%. Left atrial cavity is moderately dilated by volume and measures at 5 cm. Right ventricle cavity is moderately dilated. Moderately reduced right ventricular function. Structurally normal tricuspid valve. No evidence of tricuspid stenosis. Mild to moderate tricuspid regurgitation. Moderate pulmonary hypertension. RVSP measures 46 mmHg. IVC is normal with poor inspiration collapse consistent with elevated right atrial pressure. No significant change since 05/13/2018. TR was not noted previously.     EKG:  EKG 12/30/2021: Atrial fibrillation with controlled ventricular response at rate of 47 bpm, left axis deviation, left anterior fascicular block.  Poor R wave progression.  Low-voltage complexes.  Pulmonary disease pattern.  No evidence of ischemia.  No significant change from 10/07/2020, previously heart rate was 64 bpm.  Prior to that on 04/09/2018, heart rate was 54 bpm.   Assessment     ICD-10-CM   1. Coronary artery disease of native artery of native heart with stable angina pectoris ([place])  I25.118 EKG 12-Lead    2. Permanent atrial fibrillation (HCC) CHA2DS2-VASc Score is 4. Yearly risk of stroke: 4.  I48.21     3. Primary hypertension  I10 olmesartan-hydrochlorothiazide (BENICAR HCT) 40-25 MG tablet    amLODipine (NORVASC) 10 MG tablet    Hgb A1c w/o eAG    Basic metabolic panel    4. 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  Yearly risk of stroke: 4.8% (A, HTN, DM, Vasc Dz).  Score  of 1=0.6; 2=2.2; 3=3.2; 4=4.8; 5=7.2; 6=9.8; 7=>9.8) -(CHF; HTN; vasc disease DM,  Male = 1; Age <65 =0; 65-74 = 1,  >75 =2; stroke/embolism= 2).    Meds ordered this encounter  Medications   olmesartan-hydrochlorothiazide (BENICAR HCT) 40-25 MG tablet    Sig: Take 1 tablet by mouth every morning.    Dispense:  90 tablet    Refill:  3   amLODipine (NORVASC) 10 MG tablet    Sig: Take 1 tablet (10 mg total) by mouth every evening.    Dispense:  90 tablet    Refill:  3    Patient will get 30 day quantity, with Zero refills, Patient must come to Appointment scheduled on 12/30/2021     Medications Discontinued During This Encounter  Medication Reason   Insulin Pen Needle (NOVOFINE PLUS) 32G X 4 MM MISC    ezetimibe (ZETIA) 10 MG tablet    Insulin Lispro Prot & Lispro (HUMALOG MIX 75/25 KWIKPEN) (75-25) 100 UNIT/ML Kwikpen    metoprolol succinate (TOPROL-XL) 25 MG 24 hr tablet    benazepril (LOTENSIN) 40 MG tablet Change in therapy   amLODipine (NORVASC) 10 MG tablet      Orders Placed This Encounter  Procedures   Hgb A1c w/o eAG   Basic metabolic panel   EKG 69-CVEL    Recommendations:   William Cameron  is a 71 y.o.  male  with permanent atril fibrillation, hyperlipidemia with intolerance to statins, uncontrolled diabetes mellitus with stage 3a CKD, hypertension, morbid obesity with obstructive sleep apnea does not use CPAP regularly follows Dr. Brett Fairy, and severe 3 vessel CAD by coronary angiogram in Nov 2017 and recommended medical therapy due to diffuse disease.    This is a 6 month OV.  Patient has discontinued taking Repatha and states that he does not want to be treated with any kind of statin therapy.  Advised him to try at least red yeast rice capsules.  With regard to hypertension, blood pressure is elevated, he was out of his medications for a while and he has just recently started the medications back.  However will discontinue benazepril and switch him to olmesartan  HCT 40/25 mg in the morning and in view of stage IIIa chronic kidney disease.  Will obtain BMP in 2 to 3 weeks and see him back in 6 weeks.  With regard to atrial fibrillation, heart rate is well controlled and although he has bradycardia, remains asymptomatic.  He is not on a beta-blocker in view of bradycardia.  Controlled coronary artery disease, he remains asymptomatic without chest pain, chronic dyspnea is stable, no clinical evidence of heart failure.  Patient is a good candidate for OCEANIC-AF (Asundexian - factor XIa inhibitor PO BID vs Apixaban PO BID in patients with A. Fib for stroke prevention.  He is willing to participate.    Adrian Prows, MD, [facility] 12/30/2021, 10:02 AM  Office: [phone] Pager: [phone]

## 2022-01-03 LAB — BASIC METABOLIC PANEL
BUN/Creatinine Ratio: 11 (ref 10–24)
BUN: 13 mg/dL (ref 8–27)
CO2: 25 mmol/L (ref 20–29)
Calcium: 9.9 mg/dL (ref 8.6–10.2)
Chloride: 97 mmol/L (ref 96–106)
Creatinine, Ser: 1.15 mg/dL (ref 0.76–1.27)
Glucose: 117 mg/dL — ABNORMAL HIGH (ref 70–99)
Potassium: 4.3 mmol/L (ref 3.5–5.2)
Sodium: 140 mmol/L (ref 134–144)
eGFR: 68 mL/min/{1.73_m2} (ref >59)

## 2022-01-03 LAB — HGB A1C W/O EAG: Hgb A1c MFr Bld: 7.8 % — ABNORMAL HIGH (ref 4.8–5.6)

## 2022-02-06 ENCOUNTER — Telehealth: Payer: Self-pay | Admitting: Cardiology

## 2022-02-06 DIAGNOSIS — I10 Essential (primary) hypertension: Secondary | ICD-10-CM

## 2022-02-06 MED ORDER — HYDRALAZINE HCL 50 MG PO TABS: 50.0000 mg | ORAL_TABLET | Freq: Three times a day (TID) | ORAL | 0 refills | Status: DC | PRN

## 2022-02-06 NOTE — Telephone Encounter (Signed)
Jg will call patient

## 2022-02-06 NOTE — Telephone Encounter (Signed)
Patient was seen for study medication follow-up for OCEANIC-AF (Asundexian - factor XIa inhibitor PO BID vs Apixaban PO BID in patients with A. Fib for stroke prevention.   Patient complaint of elevated blood pressure, thinks that it may be related to the medication.  I discussed with the patient that study medication probably has no effect however if he strongly feels so, to hold the study medication for a week, restart Eliquis that he has at hand and see how he does over the next 1 week.  In the interim I will also send him for hydralazine 50 mg 3 times daily as needed for SBP >150 mmHg.  We will follow-up with him in 4 to 5 days to see how he is doing and probably will ask to resume study medication.    ICD-10-CM   1. Primary hypertension  I10 hydrALAZINE (APRESOLINE) 50 MG tablet      Meds ordered this encounter  Medications   hydrALAZINE (APRESOLINE) 50 MG tablet    Sig: Take 1 tablet (50 mg total) by mouth 3 (three) times daily as needed (SBP > 150 mm Hg).    Dispense:  90 tablet    Refill:  0

## 2022-02-06 NOTE — Telephone Encounter (Signed)
I can see him today or tomorrow

## 2022-02-06 NOTE — Telephone Encounter (Signed)
Patient started on [place] study on 06Nov2023, states BP began to rise on 07Nov2023 at 172/98. Patient also states he is having a headache. Yesterday headache was so bad he needed to leave church. Blood pressure this morning was 193/112 and he continues to have headache.  Research staff advised patient to see medical provider or go to ED. Patient would like to be seen in the office. Please advise if office visit appropriate or ED visit needed. Thank you.

## 2022-02-07 ENCOUNTER — Other Ambulatory Visit: Payer: Self-pay | Admitting: Endocrinology

## 2022-02-07 DIAGNOSIS — E1169 Type 2 diabetes mellitus with other specified complication: Secondary | ICD-10-CM

## 2022-02-10 NOTE — Telephone Encounter (Signed)
Follow up call to patient for blood pressure update. Patient states that he has not taken study medication since  his morning dose on 13Nov2023. His blood pressures have improved to as low as 95/53 since stopping study medication. He has not needed Hydralazine prescribed by Dr. Jacinto Halim on 13Nov as his blood pressures have not been above 150 systolic. Patient states BP this morning was 114/60. Subject has an office visit with Dr. Jacinto Halim on Monday, 02/13/22. He will talk more with Dr. Jacinto Halim at that time. States he will not be going back on study medication. Advised him we will reach out to him after he has seen Dr. Jacinto Halim to schedule EOT visit and discuss next steps. He is agreeable with this plan. Again, adamantly states he will not take any more doses of study medication.

## 2022-02-13 ENCOUNTER — Encounter: Payer: Self-pay | Admitting: Cardiology

## 2022-02-13 ENCOUNTER — Ambulatory Visit: Payer: Medicare Other | Admitting: Cardiology

## 2022-02-13 VITALS — BP 124/76 | HR 56 | Temp 97.8°F | Resp 16 | Ht 72.0 in | Wt 343.0 lb

## 2022-02-13 DIAGNOSIS — I10 Essential (primary) hypertension: Secondary | ICD-10-CM

## 2022-02-13 DIAGNOSIS — I25118 Atherosclerotic heart disease of native coronary artery with other forms of angina pectoris: Secondary | ICD-10-CM

## 2022-02-13 DIAGNOSIS — M609 Myositis, unspecified: Secondary | ICD-10-CM

## 2022-02-13 DIAGNOSIS — E782 Mixed hyperlipidemia: Secondary | ICD-10-CM

## 2022-02-13 DIAGNOSIS — I4821 Permanent atrial fibrillation: Secondary | ICD-10-CM

## 2022-02-13 NOTE — Progress Notes (Signed)
Primary Physician/Referring:  Jilda Panda, MD  Patient ID: William Cameron, male    DOB: 01/06/1951, 71 y.o.   MRN: [id number]  Chief Complaint  Patient presents with   Hypertension   Atrial Fibrillation   Coronary Artery Disease   Follow-up    6 weeks    HPI: William Cameron  is a 71 y.o. male male  with permanent atril fibrillation, hyperlipidemia with intolerance to statins, uncontrolled diabetes mellitus with stage 3a CKD, hypertension, morbid obesity with obstructive sleep apnea does not use CPAP regularly follows Dr. Brett Fairy, and severe 3 vessel CAD by coronary angiogram in Nov 2017 and recommended medical therapy due to diffuse disease.    Patient presents for 6-week follow-up for hypertension.  At last visit his blood pressure was elevated therefore benazepril was stopped and he was started on olmesartan HCT 40/25 mg daily.  He reports that he has not been taking this medication and feels that elevation in blood pressure is related to Oceanic-AF trial.  He does monitor his blood pressure at home and brings with him his blood pressure monitor that keeps a log of readings.  He has had fluctuations in his blood pressure with highest readings being 170s/90s.  He did check his blood pressure last night and it was 127/71.  He has not also previously stopped Repatha and is not currently on statin therapy but he is taking red yeast rice capsules. He denies chest pain, worsening shortness of breath, palpitations, leg edema, orthopnea, PND, TIA/syncope.   Past Medical History:  Diagnosis Date   Atrial fibrillation (HCC)    Bronchitis    chronic bronchitis   COPD (chronic obstructive pulmonary disease) (HCC)    Diabetes mellitus without complication (HCC)    Elevated lactic acid level    Hyperlipidemia    Hypertension    PONV (postoperative nausea and vomiting)    post op nausea   Severe obesity (BMI >= 40) (HCC) 09/03/2013   patien tlost 55 pounds, from 450 pounds, arthritism  neuropathy, diabetes , 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    Shakes    Sleep apnea    cpap at night    Past Surgical History:  Procedure Laterality Date   CARDIOVERSION N/A 01/25/2016   Procedure: CARDIOVERSION;  Surgeon: Adrian Prows, MD;  Location: [facility];  Service: Cardiovascular;  Laterality: N/A;   COLONOSCOPY  02/02/2012   Procedure: COLONOSCOPY;  Surgeon: Beryle Beams, MD;  Location: [facility];  Service: Endoscopy;  Laterality: N/A;   HERNIA REPAIR     HYDROCELE EXCISION / REPAIR     I & D EXTREMITY Left 11/24/2017   Procedure: IRRIGATION AND DEBRIDEMENT LEFT FOOT, REMOVAL OF TWO SCREWS, INSERTION OF ANTIBIOTIC BEADS;  Surgeon: Edrick Kins, DPM;  Location: [facility];  Service: Podiatry;  Laterality: Left;   JOINT REPLACEMENT     rt knee   ROTATOR CUFF REPAIR  rt   TOTAL HIP ARTHROPLASTY Right 07/14/2014   Procedure: RIGHT TOTAL HIP ARTHROPLASTY;  Surgeon: Paralee Cancel, MD;  Location: [facility];  Service: Orthopedics;  Laterality: Right;   Social History   Tobacco Use   Smoking status: Never   Smokeless tobacco: Never  Substance Use Topics   Alcohol use: No  marital Status: Widowed    Current Outpatient Medications:    amLODipine (NORVASC) 10 MG tablet, Take 1 tablet (10 mg total) by mouth every evening., Disp: 90 tablet, Rfl: 3   apixaban (ELIQUIS) 5 MG TABS tablet, Take  5 mg by mouth 2 (two) times daily., Disp: , Rfl:    glucose blood (ONETOUCH VERIO) test strip, CHECK BLOOD GLUCOSE TWICE DAILY, Disp: 200 strip, Rfl: 2   hydrALAZINE (APRESOLINE) 50 MG tablet, Take 1 tablet (50 mg total) by mouth 3 (three) times daily as needed (SBP > 150 mm Hg)., Disp: 90 tablet, Rfl: 0   olmesartan-hydrochlorothiazide (BENICAR HCT) 40-25 MG tablet, Take 1 tablet by mouth every morning., Disp: 90 tablet, Rfl: 3   Review of Systems  Cardiovascular:  Positive for dyspnea on exertion (stable). Negative for chest pain and leg swelling.  Respiratory:  Positive for snoring.     Objective  Blood pressure 124/76, pulse (!) 56, temperature 97.8 F (36.6 C), temperature source Temporal, resp. rate 16, height 6' (1.829 m), weight (!) 343 lb (155.6 kg), SpO2 96 %. Body mass index is 46.52 kg/m.      02/13/2022   11:30 AM 02/13/2022   10:19 AM 12/30/2021    9:20 AM  Vitals with BMI  Height  _0  _1   Weight  343 lbs 349 lbs 13 oz  BMI  98.11 91.47  Systolic 829 562 130  Diastolic 76 83 82  Pulse  56 67     Physical Exam Neck:     Vascular: No carotid bruit or JVD.  Cardiovascular:     Rate and Rhythm: Normal rate. Rhythm irregular.     Pulses:          Dorsalis pedis pulses are 2+ on the right side and 2+ on the left side.       Posterior tibial pulses are 2+ on the right side and 2+ on the left side.     Heart sounds: Normal heart sounds. No murmur heard.    No gallop.  Pulmonary:     Effort: Pulmonary effort is normal.     Breath sounds: Normal breath sounds. No wheezing or rales.  Musculoskeletal:     Right lower leg: No edema.     Left lower leg: No edema.    Radiology:   Laboratory examination:   Lab Results  Component Value Date   NA 140 01/02/2022   K 4.3 01/02/2022   CO2 25 01/02/2022   GLUCOSE 117 (H) 01/02/2022   BUN 13 01/02/2022   CREATININE 1.15 01/02/2022   CALCIUM 9.9 01/02/2022   EGFR 68 01/02/2022   GFRNONAA 52 05/14/2019        Latest Ref Rng & Units 01/02/2022    1:11 PM 08/03/2021    8:50 AM 01/21/2021    8:24 AM  CMP  Glucose 70 - 99 mg/dL 117  135  125   BUN 8 - 27 mg/dL _2 Creatinine 0.76 - 1.27 mg/dL 1.15  1.44  1.14   Sodium 134 - 144 mmol/L 140  135  138   Potassium 3.5 - 5.2 mmol/L 4.3  4.0  4.4   Chloride 96 - 106 mmol/L 97  100  103   CO2 20 - 29 mmol/L _3 Calcium 8.6 - 10.2 mg/dL 9.9  9.8  9.7   Total Protein 6.0 - 8.3 g/dL  8.0  7.7   Total Bilirubin 0.2 - 1.2 mg/dL  0.7  0.7   Alkaline Phos 39 - 117 U/L  67  81   AST 0 - 37 U/L  27  16   ALT 0 - 53 U/L  18  10  Latest Ref Rng & Units 11/27/2017    4:36 AM 11/26/2017    7:51 AM 11/25/2017    5:52 AM  CBC  WBC 4.0 - 10.5 K/uL 10.1  7.4  9.5   Hemoglobin 13.0 - 17.0 g/dL 13.5  12.4  12.4   Hematocrit 39.0 - 52.0 % 43.6  39.9  39.5   Platelets 150 - 400 K/uL 169  165  199    Lipid Panel Recent Labs    08/03/21 0850  CHOL 205*  TRIG 111.0  LDLCALC 142*  VLDL 22.2  HDL 41.40  CHOLHDL 5     HEMOGLOBIN A1C Lab Results  Component Value Date   HGBA1C 7.8 (H) 01/02/2022   MPG 171.42 10/08/2017   Lab Results  Component Value Date   TSH 1.01 05/11/2020   External labs:  Labs 05/31/2021:  Albumin to creatinine ratio 248 in the urine (<30/mg creatinine).  Total cholesterol 233, triglycerides 187, HDL 43, LDL 156.  Non-HDL cholesterol 190.  Serum glucose 126 mg, BUN 15, creatinine 1.06, EGFR 75 ml, potassium 4.3, LFTs normal.  Hb 15.3/HCT 47.3, platelets 166, normal indicis. Cardiac Studies:   Direct current cardioversion 01/25/2016: 120 x1, 150x1 to NSR.  Exercise sestamibi stress test 12/27/2015: 1. Two day protocol followed. The resting electrocardiogram demonstrated normal sinus rhythm, incomplete RBBB and no resting arrhythmias.  Low voltage and  poor R progression. The stress electrocardiogram was normal. Patient exercised on Bruce protocol for 4:13 minutes and achieved  4.84 METS. Stress test terminated due to % MPHR achieved (Target HR >85%). Symptoms included dyspnea. 2. The perfusion imaging study demonstrates diaphragmatic attenuation artifact in the inferior wall. There is no demonstrable ischemia or scar. LV systolic function was normal at 64%. This is a low risk study. 01/24/2016: Impression: EKG 11/13/2017: Atrial fibrillation with controlled ventricular response at the rate of 66 bpm, leftward axis, poor R-wave progression, cannot exclude anterolateral infarct old. Low-voltage complexes. Nonspecific T abnormality. No significant change from EKG 11/15/2016.  Coronary angiogram  01/24/2016: Mid LAD occluded, D2 small to moderate sized proximal 80%. Mid Cx occluded with moderate OM1 which bifurcates. Both calcific CTO. RCA PL 1 has 80% stenosis (small) and PL3 occluded, mod-large vessel. LVEF 50-55% with apical akinesis.   PCV ECHOCARDIOGRAM COMPLETE 04/11/2021 Narrative Echocardiogram 04/11/2021: Normal LV systolic function with EF 62%. Left ventricle cavity is normal in size. Mild concentric remodeling of the left ventricle. Normal global wall motion. Doppler evidence of grade I (impaired) diastolic dysfunction, normal LAP. Calculated EF 62%. Left atrial cavity is moderately dilated by volume and measures at 5 cm. Right ventricle cavity is moderately dilated. Moderately reduced right ventricular function. Structurally normal tricuspid valve. No evidence of tricuspid stenosis. Mild to moderate tricuspid regurgitation. Moderate pulmonary hypertension. RVSP measures 46 mmHg. IVC is normal with poor inspiration collapse consistent with elevated right atrial pressure. No significant change since 05/13/2018. TR was not noted previously.     EKG:  EKG 02/13/2022: Atrial fibrillation with controlled ventricular response at 63 bpm.  Left axis deviation.  Left anterior fascicular block.  Poor R wave progression, possibly pulmonary disease.  No evidence of ischemia or underlying injury pattern.  Compared to previous EKG on 12/30/2021, no significant change.  Assessment     ICD-10-CM   1. Primary hypertension  I10     2. Coronary artery disease of native artery of native heart with stable angina pectoris ([place])  I25.118     3. Permanent atrial fibrillation (HCC)  I48.21 EKG 12-Lead  4. 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  Yearly risk of stroke: 4.8% (A, HTN, DM, Vasc Dz).  Score of 1=0.6; 2=2.2; 3=3.2; 4=4.8; 5=7.2; 6=9.8; 7=>9.8) -(CHF; HTN; vasc disease DM,  Male = 1; Age <65 =0; 65-74 = 1,   >75 =2; stroke/embolism= 2).    No orders of the defined types were placed in this encounter.    There are no discontinued medications.    Orders Placed This Encounter  Procedures   EKG 12-Lead    Recommendations:   William Cameron  is a 71 y.o.  male  with permanent atril fibrillation, hyperlipidemia with intolerance to statins, uncontrolled diabetes mellitus with stage 3a CKD, hypertension, morbid obesity with obstructive sleep apnea does not use CPAP regularly follows Dr. Brett Fairy, and severe 3 vessel CAD by coronary angiogram in Nov 2017 and recommended medical therapy due to diffuse disease.    Primary hypertension Blood pressure initially elevated at today's visit.  Feel this was related to patient's frustration and anxiety.  Recheck of blood pressure under good control. At last visit he was prescribed olmesartan HCT given elevated blood pressure however he is not taking this and would like to discontinue on hydralazine as needed and amlodipine 10 mg daily. He does monitor his blood pressure daily at home and brings with him a log of readings.  Readings show labile blood pressure however he reports that his blood pressure is well controlled. Reviewed recent lab results that showed stable kidney function and normal electrolytes.  Coronary artery disease of native artery of native heart with stable angina pectoris (HCC) No recurrence of chest pain  Permanent atrial fibrillation ([place]) He remains in atrial fibrillation with controlled ventricular response. Continues on Eliquis twice daily without bleeding diathesis. Rate control: None Rhythm control: None  Mixed hyperlipidemia Statin-induced myositis He was previously on Repatha but stopped this because according to patient it was causing elevations in his blood pressure. He does not want to be treated with any statin therapy.  He is taking red yeast rice capsules daily.  Follow-up in 6 months or sooner if needed.   Ernst Spell, AGNP-C 02/13/2022, 3:27 PM Office: [phone] Pager: [phone]

## 2022-03-02 LAB — BASIC METABOLIC PANEL: Creatinine: 1.3 (ref 0.6–1.3)

## 2022-03-06 NOTE — Progress Notes (Unsigned)
Patient ID: William Cameron, male   DOB: 1951-01-04, 71 y.o.   MRN: [id number]           Reason for Appointment: Follow-up   Referring PCP: Jilda Panda   History of Present Illness:          Date of diagnosis of type 2 diabetes mellitus:   Around year 2000      Background history:  Patient has very poor recall about his initial diagnosis and previous treatment Also no records available to review diabetes management and previous A1c results He does not know if he took metformin However in 2016 his A1c was 6% and he thinks that his blood sugars started going up only when he had staph infection in his foot in 7/19 when his A1c was 7.6  He apparently stopped [place] in 2020 because of high out-of-pocket expense Previously had tried Trulicity but apparently had nausea with this and not clear when this was stopped   Recent history:      INSULIN regimen: none  Non-insulin hypoglycemic drugs the patient is taking are: None  His A1c is 7.4, previously was at 7%  Current management, blood sugar patterns and problems identified:  He did bring his monitor again and has not checked his blood sugar in several weeks Also he had lost a lot of weight previously and blood sugars are normal without any medications his A1c is increasing  Unclear when his blood sugars are high However he thinks that he is having a lot of celebrations and eating a lot of sweets and not watching his diet consistently Otherwise still trying to eat small portions and carbohydrates when he is not eating and company  Has maintained his weight  This is likely to be from his being much more active than usual where he was having back pain  Not doing much formal exercise  He has not taken any insulin and still refuses to take any Blood sugar after just eating 1 slice of toast today was 137 in the office           Side effects from diabetes medicines:?  Nausea from Trulicity   Glucose monitoring: Using One Touch Verio  monitor  Blood sugars not available   Dietician visit, most recent: 12/20 CDE visit: 9/20  Weight history:  Wt Readings from Last 3 Encounters:  02/13/22 (!) 343 lb (155.6 kg)  12/30/21 (!) 349 lb 12.8 oz (158.7 kg)  11/04/21 (!) 342 lb 3.2 oz (155.2 kg)    Glycemic control:   Lab Results  Component Value Date   HGBA1C 7.8 (H) 01/02/2022   HGBA1C 7.4 (A) 11/04/2021   HGBA1C 7.0 (A) 08/03/2021   Lab Results  Component Value Date   MICROALBUR 159 05/11/2020   LDLCALC 142 (H) 08/03/2021   CREATININE 1.15 01/02/2022   Lab Results  Component Value Date   MICRALBCREAT 8.5 02/23/2020    No results found for: "FRUCTOSAMINE"  No visits with results within 1 Week(s) from this visit.  Latest known visit with results is:  Office Visit on 12/30/2021  Component Date Value Ref Range Status   Hgb A1c MFr Bld 01/02/2022 7.8 (H)  4.8 - 5.6 % Final   Comment:          Prediabetes: 5.7 - 6.4          Diabetes: >6.4          Glycemic control for adults with diabetes: <7.0    Glucose 01/02/2022 117 (H)  70 - 99 mg/dL Final   BUN 01/02/2022 13  8 - 27 mg/dL Final   Creatinine, Ser 01/02/2022 1.15  0.76 - 1.27 mg/dL Final   eGFR 01/02/2022 68  >59 mL/min/1.73 Final   BUN/Creatinine Ratio 01/02/2022 11  10 - 24 Final   Sodium 01/02/2022 140  134 - 144 mmol/L Final   Potassium 01/02/2022 4.3  3.5 - 5.2 mmol/L Final   Chloride 01/02/2022 97  96 - 106 mmol/L Final   CO2 01/02/2022 25  20 - 29 mmol/L Final   Calcium 01/02/2022 9.9  8.6 - 10.2 mg/dL Final    Allergies as of 03/07/2022       Reactions   Penicillins Anaphylaxis   Has patient had a PCN reaction causing immediate rash, facial/tongue/throat swelling, SOB or lightheadedness with hypotension: Yes Has patient had a PCN reaction causing severe rash involving mucus membranes or skin necrosis: Yes Has patient had a PCN reaction that required hospitalization Yes Has patient had a PCN reaction occurring within the last 10  years: No If all of the above answers are "NO", then may proceed with Cephalosporin use.   Tetanus Toxoids Anaphylaxis, Shortness Of Breath, Swelling   Sulfa Antibiotics Nausea And Vomiting, Swelling   Statins    Trulicity [dulaglutide]    nausea        Medication List        Accurate as of March 06, 2022  6:38 PM. If you have any questions, ask your nurse or doctor.          amLODipine 10 MG tablet Commonly known as: NORVASC Take 1 tablet (10 mg total) by mouth every evening.   apixaban 5 MG Tabs tablet Commonly known as: ELIQUIS Take 5 mg by mouth 2 (two) times daily.   hydrALAZINE 50 MG tablet Commonly known as: APRESOLINE Take 1 tablet (50 mg total) by mouth 3 (three) times daily as needed (SBP > 150 mm Hg).   olmesartan-hydrochlorothiazide 40-25 MG tablet Commonly known as: BENICAR HCT Take 1 tablet by mouth every morning.   OneTouch Verio test strip Generic drug: glucose blood CHECK BLOOD GLUCOSE TWICE DAILY        Allergies:  Allergies  Allergen Reactions   Penicillins Anaphylaxis    Has patient had a PCN reaction causing immediate rash, facial/tongue/throat swelling, SOB or lightheadedness with hypotension: Yes Has patient had a PCN reaction causing severe rash involving mucus membranes or skin necrosis: Yes Has patient had a PCN reaction that required hospitalization Yes Has patient had a PCN reaction occurring within the last 10 years: No If all of the above answers are "NO", then may proceed with Cephalosporin use.   Tetanus Toxoids Anaphylaxis, Shortness Of Breath and Swelling   Sulfa Antibiotics Nausea And Vomiting and Swelling   Statins    Trulicity [Dulaglutide]     nausea    Past Medical History:  Diagnosis Date   Atrial fibrillation (HCC)    Bronchitis    chronic bronchitis   COPD (chronic obstructive pulmonary disease) (HCC)    Diabetes mellitus without complication (HCC)    Elevated lactic acid level    Hyperlipidemia     Hypertension    PONV (postoperative nausea and vomiting)    post op nausea   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    Shakes    Sleep apnea    cpap  at night    Past Surgical History:  Procedure Laterality Date   CARDIOVERSION N/A 01/25/2016   Procedure: CARDIOVERSION;  Surgeon: Adrian Prows, MD;  Location: [facility];  Service: Cardiovascular;  Laterality: N/A;   COLONOSCOPY  02/02/2012   Procedure: COLONOSCOPY;  Surgeon: Beryle Beams, MD;  Location: [facility];  Service: Endoscopy;  Laterality: N/A;   HERNIA REPAIR     HYDROCELE EXCISION / REPAIR     I & D EXTREMITY Left 11/24/2017   Procedure: IRRIGATION AND DEBRIDEMENT LEFT FOOT, REMOVAL OF TWO SCREWS, INSERTION OF ANTIBIOTIC BEADS;  Surgeon: Edrick Kins, DPM;  Location: [facility];  Service: Podiatry;  Laterality: Left;   JOINT REPLACEMENT     rt knee   ROTATOR CUFF REPAIR  rt   TOTAL HIP ARTHROPLASTY Right 07/14/2014   Procedure: RIGHT TOTAL HIP ARTHROPLASTY;  Surgeon: Paralee Cancel, MD;  Location: [facility];  Service: Orthopedics;  Laterality: Right;    Family History  Problem Relation Age of Onset   Diabetes type II Mother    Stroke Mother 62   High Cholesterol Mother    Prostate cancer Father 7    Social History:  reports that he has never smoked. He has never used smokeless tobacco. He reports that he does not drink alcohol and does not use drugs.   Review of Systems   Lipid history: He had muscle and joint pains with Lipitor and also with Crestor prescribed by PCP which he could not tolerate apparently  He had been on Repatha previously   Previously has been told a couple of times to try fluvastatin XL.  However he thinks he had some side effects which he does not remember and is not taking any medication He was given a trial injection of Repatha by the cardiologist but he thinks it caused him to  have headaches and high blood pressure and refuses to consider any more medications  He is not taking the Zetia that was recommended also   Lab Results  Component Value Date   CHOL 205 (H) 08/03/2021   CHOL 221 (H) 01/21/2021   CHOL 226 (H) 10/20/2020   Lab Results  Component Value Date   HDL 41.40 08/03/2021   HDL 42.60 01/21/2021   HDL 44 10/20/2020   Lab Results  Component Value Date   LDLCALC 142 (H) 08/03/2021   LDLCALC 156 (H) 01/21/2021   LDLCALC 159 (H) 10/20/2020   Lab Results  Component Value Date   TRIG 111.0 08/03/2021   TRIG 114.0 01/21/2021   TRIG 128 10/20/2020   Lab Results  Component Value Date   CHOLHDL 5 08/03/2021   CHOLHDL 5 01/21/2021   CHOLHDL 5.1 (H) 10/20/2020   No results found for: "LDLDIRECT"          Hypertension: Has been on treatment, treatment includes benazepril 40 mg and amlodipine followed by PCP and cardiologist  BP Readings from Last 3 Encounters:  02/13/22 124/76  12/30/21 (!) 142/82  11/04/21 (!) 154/84   Has had variable renal dysfunction, creatinine levels as follows Reports of labs done by PCP not available  Lab Results  Component Value Date   CREATININE 1.15 01/02/2022   CREATININE 1.44 08/03/2021   CREATININE 1.14 01/21/2021    Most recent eye exam was in 2022, no report available from Dr. Einar Gip  Most recent foot exam: 4/22, also done by PCP  Currently known complications of diabetes: Mild neuropathy   LABS:  No visits with results within 1 Week(s)  from this visit.  Latest known visit with results is:  Office Visit on 12/30/2021  Component Date Value Ref Range Status   Hgb A1c MFr Bld 01/02/2022 7.8 (H)  4.8 - 5.6 % Final   Comment:          Prediabetes: 5.7 - 6.4          Diabetes: >6.4          Glycemic control for adults with diabetes: <7.0    Glucose 01/02/2022 117 (H)  70 - 99 mg/dL Final   BUN 01/02/2022 13  8 - 27 mg/dL Final   Creatinine, Ser 01/02/2022 1.15  0.76 - 1.27 mg/dL Final    eGFR 01/02/2022 68  >59 mL/min/1.73 Final   BUN/Creatinine Ratio 01/02/2022 11  10 - 24 Final   Sodium 01/02/2022 140  134 - 144 mmol/L Final   Potassium 01/02/2022 4.3  3.5 - 5.2 mmol/L Final   Chloride 01/02/2022 97  96 - 106 mmol/L Final   CO2 01/02/2022 25  20 - 29 mmol/L Final   Calcium 01/02/2022 9.9  8.6 - 10.2 mg/dL Final    Physical Examination:  There were no vitals taken for this visit.          ASSESSMENT:  Diabetes type 2 with severe obesity  See history of present illness for detailed discussion of current diabetes management, blood sugar patterns and problems identified  His A1c is 7.4 compared to 7  He has had mild diabetes with severe obesity  Although he has not lost any further weight he is not able to comply with his diet Previously even with very low calorie diet his A1c was only 7  Difficult to assess his blood sugars because of lack of glucose monitoring   Hyperlipidemia: Untreated and he still refuses to consider any treatment  PLAN:   Discussed need to restart checking blood sugars consistently including after meals He will try to cut back on his diet with avoiding sweets and high fat foods To call if blood sugars are consistently over 130 fasting or 180 after meals We will either consider restarting his insulin or trying [place] if he has consistently high A1c or blood sugars  He will follow-up with his PCP or cardiologist regarding blood pressure management  Also to discuss lipid management with PCP and cardiologist, as before he is refusing to take any medications  Follow-up in 3 months  There are no Patient Instructions on file for this visit.       Elayne Snare 03/06/2022, 6:38 PM   Note: This office note was prepared with Dragon voice recognition system technology. Any transcriptional errors that result from this process are unintentional.

## 2022-03-07 ENCOUNTER — Encounter: Payer: Self-pay | Admitting: Endocrinology

## 2022-03-07 ENCOUNTER — Ambulatory Visit: Payer: Medicare Other | Admitting: Endocrinology

## 2022-03-07 DIAGNOSIS — E1169 Type 2 diabetes mellitus with other specified complication: Secondary | ICD-10-CM | POA: Diagnosis not present

## 2022-03-07 DIAGNOSIS — E669 Obesity, unspecified: Secondary | ICD-10-CM

## 2022-03-07 LAB — MICROALBUMIN / CREATININE URINE RATIO
Creatinine,U: 104.7 mg/dL
Microalb Creat Ratio: 9.7 mg/g (ref 0.0–30.0)
Microalb, Ur: 10.2 mg/dL — ABNORMAL HIGH (ref 0.0–1.9)

## 2022-03-07 MED ORDER — INSULIN LISPRO PROT & LISPRO (75-25 MIX) 100 UNIT/ML KWIKPEN: PEN_INJECTOR | SUBCUTANEOUS | 2 refills | Status: DC

## 2022-03-07 NOTE — Patient Instructions (Signed)
Insulin 10 in am and 20 before  supper   Check blood sugars on waking up 2-3 days a week  Also check blood sugars about 2 hours after meals and do this after different meals by rotation  Recommended blood sugar levels on waking up are 90-130 and about 2 hours after meal is 130-160  Please bring your blood sugar monitor to each visit, thank you

## 2022-03-12 ENCOUNTER — Encounter: Payer: Self-pay | Admitting: Endocrinology

## 2022-06-14 ENCOUNTER — Ambulatory Visit: Payer: Medicare Other | Admitting: Endocrinology

## 2022-08-01 LAB — HEMOGLOBIN A1C: Hemoglobin A1C: 8.4

## 2022-08-15 ENCOUNTER — Ambulatory Visit: Payer: Medicare Other | Admitting: Cardiology

## 2022-08-15 ENCOUNTER — Encounter: Payer: Self-pay | Admitting: Cardiology

## 2022-08-15 VITALS — BP 128/78 | HR 59 | Ht 72.0 in | Wt 342.0 lb

## 2022-08-15 DIAGNOSIS — I10 Essential (primary) hypertension: Secondary | ICD-10-CM

## 2022-08-15 DIAGNOSIS — I4821 Permanent atrial fibrillation: Secondary | ICD-10-CM

## 2022-08-15 DIAGNOSIS — M609 Myositis, unspecified: Secondary | ICD-10-CM

## 2022-08-15 DIAGNOSIS — I25118 Atherosclerotic heart disease of native coronary artery with other forms of angina pectoris: Secondary | ICD-10-CM

## 2022-08-15 DIAGNOSIS — E782 Mixed hyperlipidemia: Secondary | ICD-10-CM

## 2022-08-15 DIAGNOSIS — E1165 Type 2 diabetes mellitus with hyperglycemia: Secondary | ICD-10-CM

## 2022-08-15 MED ORDER — SEMAGLUTIDE (1 MG/DOSE) 4 MG/3ML ~~LOC~~ SOPN: 1.0000 mg | PEN_INJECTOR | SUBCUTANEOUS | 1 refills | Status: DC

## 2022-08-15 MED ORDER — SEMAGLUTIDE(0.25 OR 0.5MG/DOS) 2 MG/3ML ~~LOC~~ SOPN: PEN_INJECTOR | Freq: Once | SUBCUTANEOUS | Status: AC

## 2022-08-15 MED ORDER — SEMAGLUTIDE(0.25 OR 0.5MG/DOS) 2 MG/1.5ML ~~LOC~~ SOPN: 0.2500 mg | PEN_INJECTOR | Freq: Once | SUBCUTANEOUS | Status: DC

## 2022-08-15 NOTE — Progress Notes (Signed)
Primary Physician/Referring:  Ralene Ok, MD  Patient ID: William Cameron, male    DOB: July 03, 1950, 72 y.o.   MRN: [id number]  Chief Complaint  Patient presents with   Permanent atrial fibrillation [facility])   Primary hypertension   Coronary artery disease of native artery of native heart wi   Statin-induced myositis   Follow-up    HPI: William Cameron  is a 72 y.o. male male  with permanent atril fibrillation, hyperlipidemia with intolerance to statins, uncontrolled diabetes mellitus with stage 3a CKD, hypertension, morbid obesity with obstructive sleep apnea does not use CPAP regularly follows Dr. Vickey Huger, and severe 3 vessel CAD by coronary angiogram in Nov 2017 and recommended medical therapy due to diffuse disease.    He denies chest pain, worsening shortness of breath, palpitations, leg edema, orthopnea, PND, TIA/syncope.  He presents here for a 22-month office visit, states that he would like to try a new medication that has been tried by his friend who has lost 50 pounds, brings in the names of the [facility] and Ozempic.  Patient was previously on Trulicity however it was discontinued by the patient due to nausea.  Advise he remains asymptomatic.  He has occasional cramps in his legs at night.  Dyspnea on exertion has remained stable.  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    Shakes    Sleep apnea    cpap at night    Past Surgical History:  Procedure Laterality Date   CARDIOVERSION N/A 01/25/2016   Procedure: CARDIOVERSION;   Surgeon: Yates Decamp, MD;  Location: [facility] ENDOSCOPY;  Service: Cardiovascular;  Laterality: N/A;   COLONOSCOPY  02/02/2012   Procedure: COLONOSCOPY;  Surgeon: Theda Belfast, MD;  Location: [facility] ENDOSCOPY;  Service: Endoscopy;  Laterality: N/A;   HERNIA REPAIR     HYDROCELE EXCISION / REPAIR     I & D EXTREMITY Left 11/24/2017   Procedure: IRRIGATION AND DEBRIDEMENT LEFT FOOT, REMOVAL OF TWO SCREWS, INSERTION OF ANTIBIOTIC BEADS;  Surgeon: Felecia Shelling, DPM;  Location: [facility] OR;  Service: Podiatry;  Laterality: Left;   JOINT REPLACEMENT     rt knee   ROTATOR CUFF REPAIR  rt   TOTAL HIP ARTHROPLASTY Right 07/14/2014   Procedure: RIGHT TOTAL HIP ARTHROPLASTY;  Surgeon: Durene Romans, MD;  Location: [facility] ORS;  Service: Orthopedics;  Laterality: Right;   Social History   Tobacco Use   Smoking status: Never   Smokeless tobacco: Never  Substance Use Topics   Alcohol use: No  marital Status: Widowed   Review of Systems  Cardiovascular:  Positive for dyspnea on exertion (stable). Negative for chest pain and leg swelling.  Respiratory:  Positive for snoring.   Musculoskeletal:  Positive for muscle cramps (at night).   Objective  Blood pressure 128/78, pulse (!) 59, height 6' (1.829 m), weight (!) 342 lb (155.1 kg), SpO2 95 %. Body mass index is 46.38 kg/m.      08/15/2022    9:06 AM 03/07/2022  8:20 AM 02/13/2022   11:30 AM  Vitals with BMI  Height 6\' 0"  6\' 0"    Weight 342 lbs 341 lbs   BMI 46.37 46.24   Systolic 128 142 604  Diastolic 78 96 76  Pulse 59       Physical Exam Neck:     Vascular: No carotid bruit or JVD.  Cardiovascular:     Rate and Rhythm: Normal rate. Rhythm irregular.     Pulses: Normal pulses and intact distal pulses.          Dorsalis pedis pulses are 2+ on the right side and 2+ on the left side.       Posterior tibial pulses are 2+ on the right side and 2+ on the left side.     Heart sounds: Normal heart sounds. No murmur heard.    No gallop.  Pulmonary:      Effort: Pulmonary effort is normal.     Breath sounds: Normal breath sounds. No wheezing or rales.  Musculoskeletal:     Right lower leg: No edema.     Left lower leg: No edema.    Radiology:   Laboratory examination:   Lab Results  Component Value Date   NA 140 01/02/2022   K 4.3 01/02/2022   CO2 25 01/02/2022   GLUCOSE 117 (H) 01/02/2022   BUN 13 01/02/2022   CREATININE 1.3 03/02/2022   CALCIUM 9.9 01/02/2022   EGFR 68 01/02/2022   GFRNONAA 52 05/14/2019        Latest Ref Rng & Units 03/02/2022   12:00 AM 01/02/2022    1:11 PM 08/03/2021    8:50 AM  CMP  Glucose 70 - 99 mg/dL  540  981   BUN 8 - 27 mg/dL  13  21   Creatinine 0.6 - 1.3 1.3     1.15  1.44   Sodium 134 - 144 mmol/L  140  135   Potassium 3.5 - 5.2 mmol/L  4.3  4.0   Chloride 96 - 106 mmol/L  97  100   CO2 20 - 29 mmol/L  25  21   Calcium 8.6 - 10.2 mg/dL  9.9  9.8   Total Protein 6.0 - 8.3 g/dL   8.0   Total Bilirubin 0.2 - 1.2 mg/dL   0.7   Alkaline Phos 39 - 117 U/L   67   AST 0 - 37 U/L   27   ALT 0 - 53 U/L   18      This result is from an external source.      Latest Ref Rng & Units 11/27/2017    4:36 AM 11/26/2017    7:51 AM 11/25/2017    5:52 AM  CBC  WBC 4.0 - 10.5 K/uL 10.1  7.4  9.5   Hemoglobin 13.0 - 17.0 g/dL 19.1  47.8  29.5   Hematocrit 39.0 - 52.0 % 43.6  39.9  39.5   Platelets 150 - 400 K/uL 169  165  199    Lab Results  Component Value Date   CHOL 205 (H) 08/03/2021   HDL 41.40 08/03/2021   LDLCALC 142 (H) 08/03/2021   TRIG 111.0 08/03/2021   CHOLHDL 5 08/03/2021    HEMOGLOBIN A1C Lab Results  Component Value Date   HGBA1C 7.8 (H) 01/02/2022   MPG 171.42 10/08/2017   Lab Results  Component Value Date   TSH 1.01 05/11/2020   External labs:  Labs 05/31/2021:  Albumin to creatinine  ratio 248 in the urine (<30/mg creatinine).  Total cholesterol 233, triglycerides 187, HDL 43, LDL 156.  Non-HDL cholesterol 190.  Serum glucose 126 mg, BUN 15, creatinine 1.06, EGFR 75  ml, potassium 4.3, LFTs normal.  Hb 15.3/HCT 47.3, platelets 166, normal indicis. Cardiac Studies:   Direct current cardioversion 01/25/2016: 120 x1, 150x1 to NSR.  Exercise sestamibi stress test 12/27/2015: 1. Two day protocol followed. The resting electrocardiogram demonstrated normal sinus rhythm, incomplete RBBB and no resting arrhythmias.  Low voltage and  poor R progression. The stress electrocardiogram was normal. Patient exercised on Bruce protocol for 4:13 minutes and achieved  4.84 METS. Stress test terminated due to % MPHR achieved (Target HR >85%). Symptoms included dyspnea. 2. The perfusion imaging study demonstrates diaphragmatic attenuation artifact in the inferior wall. There is no demonstrable ischemia or scar. LV systolic function was normal at 64%. This is a low risk study. 01/24/2016: Impression: EKG 11/13/2017: Atrial fibrillation with controlled ventricular response at the rate of 66 bpm, leftward axis, poor R-wave progression, cannot exclude anterolateral infarct old. Low-voltage complexes. Nonspecific T abnormality. No significant change from EKG 11/15/2016.  Coronary angiogram 01/24/2016: Mid LAD occluded, D2 small to moderate sized proximal 80%. Mid Cx occluded with moderate OM1 which bifurcates. Both calcific CTO. RCA PL 1 has 80% stenosis (small) and PL3 occluded, mod-large vessel. LVEF 50-55% with apical akinesis.   PCV ECHOCARDIOGRAM COMPLETE 04/11/2021 Narrative Echocardiogram 04/11/2021: Normal LV systolic function with EF 62%. Left ventricle cavity is normal in size. Mild concentric remodeling of the left ventricle. Normal global wall motion. Doppler evidence of grade I (impaired) diastolic dysfunction, normal LAP. Calculated EF 62%. Left atrial cavity is moderately dilated by volume and measures at 5 cm. Right ventricle cavity is moderately dilated. Moderately reduced right ventricular function. Structurally normal tricuspid valve. No evidence of tricuspid  stenosis. Mild to moderate tricuspid regurgitation. Moderate pulmonary hypertension. RVSP measures 46 mmHg. IVC is normal with poor inspiration collapse consistent with elevated right atrial pressure. No significant change since 05/13/2018. TR was not noted previously.     EKG:  EKG 08/15/2022: Atrial fibrillation with controlled ventricular response at that of 60 bpm, left intrafascicular block, incomplete right bundle branch block.  Poor R progression, cannot exclude anterolateral infarct old.  Low-voltage complexes, pulmonary disease pattern.  Compared to 02/13/2022, no significant change.    Allergies and medications:   Allergies  Allergen Reactions   Penicillins Anaphylaxis    Has patient had a PCN reaction causing immediate rash, facial/tongue/throat swelling, SOB or lightheadedness with hypotension: Yes Has patient had a PCN reaction causing severe rash involving mucus membranes or skin necrosis: Yes Has patient had a PCN reaction that required hospitalization Yes Has patient had a PCN reaction occurring within the last 10 years: No If all of the above answers are "NO", then may proceed with Cephalosporin use.   Tetanus Toxoids Anaphylaxis, Shortness Of Breath and Swelling   Sulfa Antibiotics Nausea And Vomiting and Swelling   Statins    Trulicity [Dulaglutide]     nausea    Current Outpatient Medications:    apixaban (ELIQUIS) 5 MG TABS tablet, Take 5 mg by mouth 2 (two) times daily., Disp: , Rfl:    glucose blood (ONETOUCH VERIO) test strip, CHECK BLOOD GLUCOSE TWICE DAILY, Disp: 200 strip, Rfl: 2   Insulin Lispro Prot & Lispro (HUMALOG MIX 75/25 KWIKPEN) (75-25) 100 UNIT/ML Kwikpen, 10 U in am and 20 ac supper, Disp: 15 mL, Rfl: 2   metoprolol succinate (TOPROL-XL)  25 MG 24 hr tablet, Take 25 mg by mouth daily., Disp: , Rfl:    olmesartan-hydrochlorothiazide (BENICAR HCT) 40-25 MG tablet, Take 1 tablet by mouth every morning., Disp: 90 tablet, Rfl: 3   Semaglutide, 1 MG/DOSE,  4 MG/3ML SOPN, Inject 1 mg into the skin once a week., Disp: 9 mL, Rfl: 1  Current Facility-Administered Medications:    [START ON 09/26/2022] Semaglutide(0.25 or 0.5MG /DOS) SOPN 0.25 mg, 0.25 mg, Subcutaneous, Once, Yates Decamp, MD   Assessment     ICD-10-CM   1. Permanent atrial fibrillation (HCC)  I48.21 EKG 12-Lead    2. Primary hypertension  I10     3. Coronary artery disease of native artery of native heart with stable angina pectoris (HCC)  I25.118 Semaglutide, 1 MG/DOSE, 4 MG/3ML SOPN    Semaglutide(0.25 or 0.5MG /DOS) SOPN 0.25 mg    4. Mixed hyperlipidemia  E78.2     5. Statin-induced myositis  M60.9    T46.6X5A     6. Uncontrolled type 2 diabetes mellitus with hyperglycemia, with long-term current use of insulin (HCC)  E11.65 Semaglutide, 1 MG/DOSE, 4 MG/3ML SOPN   Z79.4 Semaglutide(0.25 or 0.5MG /DOS) SOPN 0.25 mg      CHA2DS2-VASc Score is 4.  Yearly risk of stroke: 4.8% (A, HTN, DM, Vasc Dz).  Score of 1=0.6; 2=2.2; 3=3.2; 4=4.8; 5=7.2; 6=9.8; 7=>9.8) -(CHF; HTN; vasc disease DM,  Male = 1; Age <65 =0; 65-74 = 1,  >75 =2; stroke/embolism= 2).    Meds ordered this encounter  Medications   Semaglutide, 1 MG/DOSE, 4 MG/3ML SOPN    Sig: Inject 1 mg into the skin once a week.    Dispense:  9 mL    Refill:  1   Semaglutide(0.25 or 0.5MG /DOS) SOPN 0.25 mg   Medications Discontinued During This Encounter  Medication Reason   amLODipine (NORVASC) 10 MG tablet    hydrALAZINE (APRESOLINE) 50 MG tablet    METOPROLOL SUCCINATE PO    Administrations This Visit     Semaglutide(0.25 or 0.5MG /DOS) SOPN     Admin Date 08/15/2022 Action Given Dose  Route Subcutaneous Administered By Erby Pian, CMA             Orders Placed This Encounter  Procedures   EKG 12-Lead    Recommendations:   William Cameron  is a 72 y.o.  male  with permanent atril fibrillation, hyperlipidemia with intolerance to statins, uncontrolled diabetes mellitus with stage 3a CKD,  hypertension, morbid obesity with obstructive sleep apnea does not use CPAP regularly follows Dr. Vickey Huger, and severe 3 vessel CAD by coronary angiogram in Nov 2017 and recommended medical therapy due to diffuse disease.    1. Permanent atrial fibrillation [facility]) Patient with permanent atrial fibrillation, is well-controlled.  Continue anticoagulation.  He is tolerating all the medications well.  Reviewed his labs, CBC and renal function has remained stable. - EKG 12-Lead  2. Primary hypertension Blood pressure is well-controlled today.  Continue olmesartan HCT and also metoprolol, continue the same.  3. Coronary artery disease of native artery of native heart with stable angina pectoris [facility]) She has not had any recurrence of angina pectoris she has severe diffuse coronary disease.  Extremely high risk for future cardiovascular events. - Semaglutide, 1 MG/DOSE, 4 MG/3ML SOPN; Inject 1 mg into the skin once a week.  Dispense: 9 mL; Refill: 1 - Semaglutide(0.25 or 0.5MG /DOS) SOPN 0.25 mg  4. Mixed hyperlipidemia Patient is not on a statin, also not on any PCSK9 in the, states  that he is allergic to all of these medications and he is not willing to start any of them.  5. Statin-induced myositis   6. Uncontrolled type 2 diabetes mellitus with hyperglycemia, with long-term current use of insulin (HCC) Patient wants to try some weight.  He had tried Trulicity in the past but had discontinued due to nausea.  I advised the patient that if he eats fatty meal, excess amount of, he will be feeling nausea and abdominal discomfort.  Instructions on how to eat, slow eating, taking breaks between the meal to improve satiety discussed with the patient.  Will bring admission Ozempic 05 mg today and update as tolerated  - Semaglutide, 1 MG/DOSE, 4 MG/3ML SOPN; Inject 1 mg into the skin once a week.  Dispense: 9 mL; Refill: 1 - Semaglutide(0.25 or 0.5MG /DOS) SOPN 0.25 mg  Patient complains of leg cramps  however his vascular examination is normal.  Suspect diabetic peripheral neuropathy.  Office visit in 6 weeks in view of starting patient on Ozempic, I have discussed with him regarding avoidance of dehydration.   Yates Decamp, MD, [facility] 08/15/2022, 10:02 AM Office: [phone] Fax: [phone] Pager: [phone]

## 2022-08-29 ENCOUNTER — Telehealth: Payer: Self-pay

## 2022-08-29 NOTE — Telephone Encounter (Signed)
Since starting Ozempic his bp has been 96/59, 100/65. He has been very fatigued. His blood sugars are around 120-125. He has stopped all bp medication and is feeling much better today his bp is123/70. He has lost 17 lbs. Is stopping bp medicationok?

## 2022-08-29 NOTE — Telephone Encounter (Signed)
I am good with stopping all BP medications and will readdress this on his visit. Congrats!!

## 2022-08-31 NOTE — Telephone Encounter (Signed)
Patient verbalized understanding  

## 2022-09-26 ENCOUNTER — Ambulatory Visit: Payer: Medicare Other | Admitting: Cardiology

## 2022-09-26 ENCOUNTER — Encounter: Payer: Self-pay | Admitting: Cardiology

## 2022-09-26 VITALS — BP 114/73 | HR 66 | Resp 16 | Ht 72.0 in | Wt 330.4 lb

## 2022-09-26 DIAGNOSIS — I4821 Permanent atrial fibrillation: Secondary | ICD-10-CM

## 2022-09-26 DIAGNOSIS — E1165 Type 2 diabetes mellitus with hyperglycemia: Secondary | ICD-10-CM

## 2022-09-26 DIAGNOSIS — I10 Essential (primary) hypertension: Secondary | ICD-10-CM

## 2022-09-26 MED ORDER — METOPROLOL SUCCINATE ER 25 MG PO TB24: 12.5000 mg | ORAL_TABLET | Freq: Every day | ORAL | 3 refills | Status: DC

## 2022-09-26 MED ORDER — OLMESARTAN MEDOXOMIL-HCTZ 40-25 MG PO TABS: 0.5000 | ORAL_TABLET | ORAL | 3 refills | Status: DC

## 2022-09-26 NOTE — Progress Notes (Signed)
Primary Physician/Referring:  Ralene Ok, MD  Patient ID: William Cameron, male    DOB: [birth year], 72 y.o.   MRN: [id number]  Chief Complaint  Patient presents with  . Permanent atrial fibrillation  . Primary hypertension    HPI: William Cameron  is a 72 y.o. male male  with permanent atril fibrillation, hyperlipidemia with intolerance to statins, uncontrolled diabetes mellitus with stage 3a CKD, hypertension, morbid obesity with obstructive sleep apnea does not use CPAP regularly follows Dr. Vickey Huger, and severe 3 vessel CAD by coronary angiogram in Nov 2017 and recommended medical therapy due to diffuse disease.    Patient was started on Ozempic 6 weeks ago, he has lost about 15 to 18 pounds in weight, feels well and dyspnea has improved.  He has noticed side effects of dizziness and also low blood pressure, also has had change in his bowel habits.  Otherwise tolerating the medication.  Past Medical History:  Diagnosis Date  . Atrial fibrillation (HCC)   . Bronchitis    chronic bronchitis  . COPD (chronic obstructive pulmonary disease) (HCC)   . Diabetes mellitus without complication (HCC)   . Elevated lactic acid level   . Hyperlipidemia   . Hypertension   . PONV (postoperative nausea and vomiting)    post op nausea  . 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   William Cameron   . Sleep apnea    cpap at night    Past Surgical History:  Procedure Laterality Date  . CARDIOVERSION N/A 01/25/2016   Procedure: CARDIOVERSION;  Surgeon: Yates Decamp, MD;  Location: [facility] ENDOSCOPY;  Service: Cardiovascular;  Laterality: N/A;  . COLONOSCOPY  02/02/2012   Procedure: COLONOSCOPY;  Surgeon: Theda Belfast, MD;  Location: [facility] ENDOSCOPY;  Service: Endoscopy;  Laterality: N/A;  . HERNIA REPAIR    . HYDROCELE EXCISION / REPAIR    . I & D EXTREMITY Left 11/24/2017   Procedure:  IRRIGATION AND DEBRIDEMENT LEFT FOOT, REMOVAL OF TWO SCREWS, INSERTION OF ANTIBIOTIC BEADS;  Surgeon: Felecia Shelling, DPM;  Location: [facility] OR;  Service: Podiatry;  Laterality: Left;  . JOINT REPLACEMENT     rt knee  . ROTATOR CUFF REPAIR  rt  . TOTAL HIP ARTHROPLASTY Right 07/14/2014   Procedure: RIGHT TOTAL HIP ARTHROPLASTY;  Surgeon: Durene Romans, MD;  Location: [facility] ORS;  Service: Orthopedics;  Laterality: Right;   Social History   Tobacco Use  . Smoking status: Never  . Smokeless tobacco: Never  Substance Use Topics  . Alcohol use: No  marital Status: Widowed   Review of Systems  Cardiovascular:  Positive for dyspnea on exertion (Improving with weight loss). Negative for chest pain and leg swelling.  Respiratory:  Positive for snoring.   Musculoskeletal:  Positive for muscle cramps (at night).  Gastrointestinal:  Positive for diarrhea.  Neurological:  Positive for dizziness.   Objective  Blood pressure 114/73, pulse 66, resp. rate 16, height 6' (1.829 m), weight (!) 330 lb 6.4 oz (149.9 kg), SpO2 96 %. Body mass index is 44.81 kg/m.      09/26/2022    8:48 AM 08/15/2022    9:06 AM 03/07/2022    8:20 AM  Vitals with BMI  Height 6\' 0"  6\' 0"  6\' 0"   Weight 330 lbs 6 oz 342 lbs 341 lbs  BMI 44.8 46.37 46.24  Systolic 114 128 782  Diastolic 73 78 96  Pulse 66 59      Physical Exam Neck:     Vascular: No carotid bruit or JVD.  Cardiovascular:     Rate and Rhythm: Normal rate. Rhythm irregular.     Pulses: Normal pulses and intact distal pulses.          Dorsalis pedis pulses are 2+ on the right side and 2+ on the left side.       Posterior tibial pulses are 2+ on the right side and 2+ on the left side.     Heart sounds: Normal heart sounds. No murmur heard.    No gallop.  Pulmonary:     Effort: Pulmonary effort is normal.     Breath sounds: Normal breath sounds. No wheezing or rales.  Musculoskeletal:     Right lower leg: No edema.     Left lower leg: No edema.    Radiology:   Laboratory examination:   Lab Results  Component Value Date   NA 140 01/02/2022   K 4.3 01/02/2022   CO2 25 01/02/2022   GLUCOSE 117 (H) 01/02/2022   BUN 13 01/02/2022   CREATININE 1.3 03/02/2022   CALCIUM 9.9 01/02/2022   EGFR 68 01/02/2022   GFRNONAA 52 05/14/2019        Latest Ref Rng & Units 03/02/2022   12:00 AM 01/02/2022    1:11 PM 08/03/2021    8:50 AM  CMP  Glucose 70 - 99 mg/dL  098  119   BUN 8 - 27 mg/dL  13  21   Creatinine 0.6 - 1.3 1.3     1.15  1.44   Sodium 134 - 144 mmol/L  140  135   Potassium 3.5 - 5.2 mmol/L  4.3  4.0   Chloride 96 - 106 mmol/L  97  100   CO2 20 - 29 mmol/L  25  21   Calcium 8.6 - 10.2 mg/dL  9.9  9.8   Total Protein 6.0 - 8.3 g/dL   8.0   Total Bilirubin 0.2 - 1.2 mg/dL   0.7   Alkaline Phos 39 - 117 U/L   67   AST 0 - 37 U/L   27   ALT 0 - 53 U/L   18      This result is from an external source.      Latest Ref Rng & Units 11/27/2017    4:36 AM 11/26/2017    7:51 AM 11/25/2017    5:52 AM  CBC  WBC 4.0 - 10.5 K/uL 10.1  7.4  9.5   Hemoglobin 13.0 - 17.0 g/dL 14.7  82.9  56.2   Hematocrit 39.0 - 52.0 % 43.6  39.9  39.5   Platelets 150 - 400 K/uL 169  165  199    Lab Results  Component Value Date   CHOL 205 (H) 08/03/2021   HDL 41.40 08/03/2021   LDLCALC 142 (H) 08/03/2021   TRIG 111.0 08/03/2021   CHOLHDL 5 08/03/2021    HEMOGLOBIN A1C Lab Results  Component Value Date   HGBA1C 7.8 (H) 01/02/2022   MPG 171.42 10/08/2017   Lab Results  Component Value Date   TSH 1.01 05/11/2020   External labs:  Labs 05/31/2021:  Albumin to creatinine ratio 248 in the urine (<30/mg creatinine).  Total cholesterol 233, triglycerides 187, HDL 43, LDL 156.  Non-HDL cholesterol 190.  Serum glucose 126 mg, BUN 15, creatinine 1.06, EGFR 75 ml, potassium 4.3, LFTs normal.  Hb 15.3/HCT 47.3, platelets 166, normal indicis. Cardiac Studies:   Direct current cardioversion 01/25/2016: 120 x1, 150x1 to NSR.  Exercise  sestamibi stress test 12/27/2015: 1. Two day protocol followed. The resting electrocardiogram demonstrated normal sinus rhythm, incomplete RBBB and no resting arrhythmias.  Low voltage and  poor R progression. The stress electrocardiogram was normal. Patient exercised on Bruce protocol for 4:13 minutes and achieved  4.84 METS. Stress test terminated due to % MPHR achieved (Target HR >85%). Symptoms included dyspnea. 2. The perfusion imaging study demonstrates diaphragmatic attenuation artifact in the inferior wall. There is no demonstrable ischemia or scar. LV systolic function was normal at 64%. This is a low risk study. 01/24/2016: Impression: EKG 11/13/2017: Atrial fibrillation with controlled ventricular response at the rate of 66 bpm, leftward axis, poor R-wave progression, cannot exclude anterolateral infarct old. Low-voltage complexes. Nonspecific T abnormality. No significant change from EKG 11/15/2016.  Coronary angiogram 01/24/2016: Mid LAD occluded, D2 small to moderate sized proximal 80%. Mid Cx occluded with moderate OM1 which bifurcates. Both calcific CTO. RCA PL 1 has 80% stenosis (small) and PL3 occluded, mod-large vessel. LVEF 50-55% with apical akinesis.   PCV ECHOCARDIOGRAM COMPLETE 04/11/2021 Narrative Echocardiogram 04/11/2021: Normal LV systolic function with EF 62%. Left ventricle cavity is normal in size. Mild concentric remodeling of the left ventricle. Normal global wall motion. Doppler evidence of grade I (impaired) diastolic dysfunction, normal LAP. Calculated EF 62%. Left atrial cavity is moderately dilated by volume and measures at 5 cm. Right ventricle cavity is moderately dilated. Moderately reduced right ventricular function. Structurally normal tricuspid valve. No evidence of tricuspid stenosis. Mild to moderate tricuspid regurgitation. Moderate pulmonary hypertension. RVSP measures 46 mmHg. IVC is normal with poor inspiration collapse consistent with elevated right  atrial pressure. No significant change since 05/13/2018. TR was not noted previously.     EKG:  EKG 09/26/2022: Atrial fibrillation with controlled ventricular response at rate of 57 bpm, left anterior fascicular block.  Anterolateral infarct old.  Low-voltage complexes.  Consider pulmonary disease pattern.  Compared to 08/15/2022, no change. Allergies and medications:   Allergies  Allergen Reactions  . Penicillins Anaphylaxis    Has patient had a PCN reaction causing immediate rash, facial/tongue/throat swelling, SOB or lightheadedness with hypotension: Yes Has patient had a PCN reaction causing severe rash involving mucus membranes or skin necrosis: Yes Has patient had a PCN reaction that required hospitalization Yes Has patient had a PCN reaction occurring within the last 10 years: No If all of the above answers are "NO", then may proceed with Cephalosporin use.  . Tetanus Toxoids Anaphylaxis, Shortness Of Breath and Swelling  . Sulfa Antibiotics Nausea And Vomiting and Swelling  . Statins   . Trulicity [Dulaglutide]     nausea    Current Outpatient Medications:  .  apixaban (ELIQUIS) 5 MG TABS tablet, Take 5 mg by mouth 2 (two) times daily., Disp: , Rfl:  .  glucose blood (ONETOUCH VERIO) test strip, CHECK BLOOD GLUCOSE TWICE DAILY, Disp: 200 strip, Rfl: 2 .  Insulin Lispro Prot & Lispro (HUMALOG MIX 75/25 KWIKPEN) (75-25) 100 UNIT/ML Kwikpen, 10 U in am and 20 ac supper, Disp: 15 mL, Rfl: 2 .  Semaglutide, 1 MG/DOSE, 4 MG/3ML SOPN, Inject 1 mg into the skin once a week., Disp: 9 mL, Rfl: 1 .  metoprolol succinate (TOPROL-XL) 25 MG 24 hr tablet, Take 0.5 tablets (12.5 mg total) by mouth daily., Disp: 90 tablet, Rfl: 3 .  olmesartan-hydrochlorothiazide (BENICAR HCT) 40-25 MG tablet,  Take 0.5 tablets by mouth every morning., Disp: 90 tablet, Rfl: 3  Current Facility-Administered Medications:  .  Semaglutide(0.25 or 0.5MG /DOS) SOPN 0.25 mg, 0.25 mg, Subcutaneous, Once, Yates Decamp, MD    Assessment     ICD-10-CM   1. Permanent atrial fibrillation (HCC)  I48.21 EKG 12-Lead    metoprolol succinate (TOPROL-XL) 25 MG 24 hr tablet    2. Primary hypertension  I10 olmesartan-hydrochlorothiazide (BENICAR HCT) 40-25 MG tablet    metoprolol succinate (TOPROL-XL) 25 MG 24 hr tablet    3. Uncontrolled type 2 diabetes mellitus with hyperglycemia, with long-term current use of insulin (HCC)  E11.65    Z79.4       CHA2DS2-VASc Score is 4.  Yearly risk of stroke: 4.8% (A, HTN, DM, Vasc Dz).  Score of 1=0.6; 2=2.2; 3=3.2; 4=4.8; 5=7.2; 6=9.8; 7=>9.8) -(CHF; HTN; vasc disease DM,  Male = 1; Age <65 =0; 65-74 = 1,  >75 =2; stroke/embolism= 2).    Meds ordered this encounter  Medications  . olmesartan-hydrochlorothiazide (BENICAR HCT) 40-25 MG tablet    Sig: Take 0.5 tablets by mouth every morning.    Dispense:  90 tablet    Refill:  3  . metoprolol succinate (TOPROL-XL) 25 MG 24 hr tablet    Sig: Take 0.5 tablets (12.5 mg total) by mouth daily.    Dispense:  90 tablet    Refill:  3   Medications Discontinued During This Encounter  Medication Reason  . olmesartan-hydrochlorothiazide (BENICAR HCT) 40-25 MG tablet Reorder  . metoprolol succinate (TOPROL-XL) 25 MG 24 hr tablet Reorder     Orders Placed This Encounter  Procedures  . EKG 12-Lead    Recommendations:   William Cameron  is a 71 y.o.  male  with permanent atril fibrillation, hyperlipidemia with intolerance to statins, uncontrolled diabetes mellitus with stage 3a CKD, hypertension, morbid obesity with obstructive sleep apnea does not use CPAP regularly follows Dr. Vickey Huger, and severe 3 vessel CAD by coronary angiogram in Nov 2017 and recommended medical therapy due to diffuse disease.    1. Permanent atrial fibrillation (HCC) Patient's heart rate is well-controlled, remains asymptomatic with regard to atrial fibrillation and tolerating anticoagulation well. - EKG 12-Lead - metoprolol succinate (TOPROL-XL) 25 MG  24 hr tablet; Take 0.5 tablets (12.5 mg total) by mouth daily.  Dispense: 90 tablet; Refill: 3  2. Primary hypertension Since the Ozempic, patient has lost about 15 to 18 pounds in weight, has been having low blood pressure, hence I reviewed his medications, I reduced the dose of olmesartan HCT from 40/25 mg to 1/2 tablet daily and metoprolol succinate from 25 mg to 1/2 tablet daily. I encouraged him to continue to use Ozempic. - olmesartan-hydrochlorothiazide (BENICAR HCT) 40-25 MG tablet; Take 0.5 tablets by mouth every morning.  Dispense: 90 tablet; Refill: 3 - metoprolol succinate (TOPROL-XL) 25 MG 24 hr tablet; Take 0.5 tablets (12.5 mg total) by mouth daily.  Dispense: 90 tablet; Refill: 3  3. Uncontrolled type 2 diabetes mellitus with hyperglycemia, with long-term current use of insulin (HCC) Patient's response has been excellent.  He has some GI issues including episodes of loose stools, I have discussed with him regarding slow eating, eating and taking a break halfway through the meal to see whether he feels full and to discontinue any further eating, will I will reduce the side effects.  I encouraged him to continue using 0.5 mg of Ozempic if possible if he is able to tolerate otherwise reduce it back to 0.25  mg.  I would like to see him back in 6 weeks for follow-up.  Patient states that since losing weight, he feels much lighter, dyspnea has improved, states that he is now able to walk much longer without any dyspnea.  Overall he feels well otherwise but worried about the side effects of dizziness and change in bowel habits.  I reassured him. I have discussed with him regarding avoidance of dehydration.   Yates Decamp, MD, [facility] 09/26/2022, 9:57 AM Office: [phone] Fax: [phone] Pager: [phone]

## 2022-09-27 ENCOUNTER — Encounter: Payer: Self-pay | Admitting: Endocrinology

## 2022-09-27 ENCOUNTER — Ambulatory Visit: Payer: Medicare Other | Admitting: Endocrinology

## 2022-09-27 VITALS — BP 124/72 | HR 60 | Ht 72.0 in | Wt 330.0 lb

## 2022-09-27 DIAGNOSIS — Z6841 Body Mass Index (BMI) 40.0 and over, adult: Secondary | ICD-10-CM | POA: Diagnosis not present

## 2022-09-27 DIAGNOSIS — E1165 Type 2 diabetes mellitus with hyperglycemia: Secondary | ICD-10-CM | POA: Diagnosis not present

## 2022-09-27 DIAGNOSIS — Z794 Long term (current) use of insulin: Secondary | ICD-10-CM | POA: Diagnosis not present

## 2022-09-27 LAB — POCT GLYCOSYLATED HEMOGLOBIN (HGB A1C): Hemoglobin A1C: 7.2 % — AB (ref 4.0–5.6)

## 2022-09-27 LAB — GLUCOSE, POCT (MANUAL RESULT ENTRY): POC Glucose: 186 mg/dl — AB (ref 70–99)

## 2022-09-27 NOTE — Patient Instructions (Addendum)
Stay on 0.5 mg Ozempic, not take 1mg  as of now  Check blood sugars on waking up 3 days a week  Also check blood sugars about 2 hours after meals and do this after different meals by rotation  Recommended blood sugar levels on waking up are 90-130 and about 2 hours after meal is 130-160  Have protein at each meal

## 2022-09-27 NOTE — Progress Notes (Signed)
Patient ID: William Cameron, male   DOB: [birth year], 72 y.o.   MRN: [id number]         Reason for Appointment: Follow-up   Referring PCP: Ralene Ok   History of Present Illness:          Date of diagnosis of type 2 diabetes mellitus:   Around year 2000      Background history:  Patient has very poor recall about his initial diagnosis and previous treatment Also no records available to review diabetes management and previous A1c results He does not know if he took metformin However in 2016 his A1c was 6% and he thinks that his blood sugars started going up only when he had staph infection in his foot in 7/19 when his A1c was 7.6  He apparently stopped [place] in 2020 because of high out-of-pocket expense Previously had tried Trulicity but apparently had nausea with this and not clear when this was stopped   Recent history:      INSULIN regimen: Previously taking Humalog mix 12 units in the morning and 22 units at bedtime  Non-insulin hypoglycemic drugs the patient is taking are: Ozempic 0.5 mg weekly  His A1c is 7.2 but was 8.4 in 5/24  Current management, blood sugar patterns and problems identified:  He did not bring his monitor  He has not been seen since 12/23 Previously had refused to consider any medication in addition to insulin but now his cardiologist has started him on Ozempic about 6 weeks ago He says he took 0.25 mg for 4 injections and has taken 2 injections of the 0.5 mg However he has 1 mg prescription ordered and not clear if he has received this He says that with the Ozempic he has some diarrhea but he is trying to adjust his diet to relieve this, no nausea with this and is able to cut back on portions He says his blood sugar started dropping with starting the Ozempic and he stopped insulin completely after the first week According to his weight history he has lost 12 pounds recently He is also little bit more physically active, previously had limitations  because of knee pain           Side effects from diabetes medicines:?  Nausea from Trulicity   Glucose monitoring: Using One Touch Verio monitor  By recall blood sugars are about 120 both fasting and bedtime  Previous history: 30-day download through 03/07/2022:  PRE-MEAL Morning Lunch Dinner Bedtime Overall  Glucose range: 130-177 108-155 83, 107 131 83-177  Mean/median:     134   POST-MEAL PC Breakfast PC Lunch PC Dinner  Glucose range:   ?  Mean/median:       Dietician visit, most recent: 12/20 CDE visit: 9/20  Weight history:  Wt Readings from Last 3 Encounters:  09/27/22 (!) 330 lb (149.7 kg)  09/26/22 (!) 330 lb 6.4 oz (149.9 kg)  08/15/22 (!) 342 lb (155.1 kg)    Glycemic control:   Lab Results  Component Value Date   HGBA1C 8.4 08/01/2022   HGBA1C 7.8 (H) 01/02/2022   HGBA1C 7.4 (A) 11/04/2021   Lab Results  Component Value Date   MICROALBUR 10.2 (H) 03/07/2022   LDLCALC 142 (H) 08/03/2021   CREATININE 1.3 03/02/2022   Lab Results  Component Value Date   MICRALBCREAT 9.7 03/07/2022    No results found for: "FRUCTOSAMINE"  Office Visit on 09/27/2022  Component Date Value Ref Range Status   Hemoglobin A1C 08/01/2022  8.4   Final    Allergies as of 09/27/2022       Reactions   Penicillins Anaphylaxis   Has patient had a PCN reaction causing immediate rash, facial/tongue/throat swelling, SOB or lightheadedness with hypotension: Yes Has patient had a PCN reaction causing severe rash involving mucus membranes or skin necrosis: Yes Has patient had a PCN reaction that required hospitalization Yes Has patient had a PCN reaction occurring within the last 10 years: No If all of the above answers are "NO", then may proceed with Cephalosporin use.   Tetanus Toxoids Anaphylaxis, Shortness Of Breath, Swelling   Sulfa Antibiotics Nausea And Vomiting, Swelling   Statins    Trulicity [dulaglutide]    nausea        Medication List        Accurate as  of September 27, 2022  9:20 AM. If you have any questions, ask your nurse or doctor.          apixaban 5 MG Tabs tablet Commonly known as: ELIQUIS Take 5 mg by mouth 2 (two) times daily.   benazepril 40 MG tablet Commonly known as: LOTENSIN Take 40 mg by mouth daily.   Insulin Lispro Prot & Lispro (75-25) 100 UNIT/ML Kwikpen Commonly known as: HumaLOG Mix 75/25 KwikPen 10 U in am and 20 ac supper   metoprolol succinate 25 MG 24 hr tablet Commonly known as: TOPROL-XL Take 0.5 tablets (12.5 mg total) by mouth daily.   olmesartan-hydrochlorothiazide 40-25 MG tablet Commonly known as: BENICAR HCT Take 0.5 tablets by mouth every morning.   OneTouch Verio test strip Generic drug: glucose blood CHECK BLOOD GLUCOSE TWICE DAILY   Semaglutide (1 MG/DOSE) 4 MG/3ML Sopn Inject 1 mg into the skin once a week.        Allergies:  Allergies  Allergen Reactions   Penicillins Anaphylaxis    Has patient had a PCN reaction causing immediate rash, facial/tongue/throat swelling, SOB or lightheadedness with hypotension: Yes Has patient had a PCN reaction causing severe rash involving mucus membranes or skin necrosis: Yes Has patient had a PCN reaction that required hospitalization Yes Has patient had a PCN reaction occurring within the last 10 years: No If all of the above answers are "NO", then may proceed with Cephalosporin use.   Tetanus Toxoids Anaphylaxis, Shortness Of Breath and Swelling   Sulfa Antibiotics Nausea And Vomiting and Swelling   Statins    Trulicity [Dulaglutide]     nausea    Past Medical History:  Diagnosis Date   Atrial fibrillation (HCC)    Bronchitis    chronic bronchitis   COPD (chronic obstructive pulmonary disease) (HCC)    Diabetes mellitus without complication (HCC)    Elevated lactic acid level    Hyperlipidemia    Hypertension    PONV (postoperative nausea and vomiting)    post op nausea   Severe obesity (BMI >= 40) (HCC) 09/03/2013   patien tlost 55  pounds, from 450 pounds, arthritism neuropathy, diabetes , 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    Shakes    Sleep apnea    cpap at night    Past Surgical History:  Procedure Laterality Date   CARDIOVERSION N/A 01/25/2016   Procedure: CARDIOVERSION;  Surgeon: Yates Decamp, MD;  Location: [facility] ENDOSCOPY;  Service: Cardiovascular;  Laterality: N/A;   COLONOSCOPY  02/02/2012   Procedure: COLONOSCOPY;  Surgeon: Theda Belfast, MD;  Location: [facility] ENDOSCOPY;  Service: Endoscopy;  Laterality: N/A;   HERNIA REPAIR  HYDROCELE EXCISION / REPAIR     I & D EXTREMITY Left 11/24/2017   Procedure: IRRIGATION AND DEBRIDEMENT LEFT FOOT, REMOVAL OF TWO SCREWS, INSERTION OF ANTIBIOTIC BEADS;  Surgeon: Felecia Shelling, DPM;  Location: [facility] OR;  Service: Podiatry;  Laterality: Left;   JOINT REPLACEMENT     rt knee   ROTATOR CUFF REPAIR  rt   TOTAL HIP ARTHROPLASTY Right 07/14/2014   Procedure: RIGHT TOTAL HIP ARTHROPLASTY;  Surgeon: Durene Romans, MD;  Location: [facility] ORS;  Service: Orthopedics;  Laterality: Right;    Family History  Problem Relation Age of Onset   Diabetes type II Mother    Stroke Mother 71   High Cholesterol Mother    Prostate cancer Father 66    Social History:  reports that he has never smoked. He has never used smokeless tobacco. He reports that he does not drink alcohol and does not use drugs.   Review of Systems   Lipid history: He had muscle and joint pains with Lipitor and also with Crestor prescribed by PCP which he could not tolerate apparently  He had been on Repatha previously   Previously has been told a couple of times to try fluvastatin XL.  However he thinks he had some side effects which he does not remember and is not taking any medication He was given a trial injection of Repatha by the cardiologist but he thinks it caused him to have headaches and high blood pressure and refuses to consider any more medications  He is not taking the Zetia that was recommended also    Lab Results  Component Value Date   CHOL 205 (H) 08/03/2021   CHOL 221 (H) 01/21/2021   CHOL 226 (H) 10/20/2020   Lab Results  Component Value Date   HDL 41.40 08/03/2021   HDL 42.60 01/21/2021   HDL 44 10/20/2020   Lab Results  Component Value Date   LDLCALC 142 (H) 08/03/2021   LDLCALC 156 (H) 01/21/2021   LDLCALC 159 (H) 10/20/2020   Lab Results  Component Value Date   TRIG 111.0 08/03/2021   TRIG 114.0 01/21/2021   TRIG 128 10/20/2020   Lab Results  Component Value Date   CHOLHDL 5 08/03/2021   CHOLHDL 5 01/21/2021   CHOLHDL 5.1 (H) 10/20/2020   No results found for: "LDLDIRECT"          Hypertension: His treatment includes benazepril 40 mg and amlodipine followed by PCP and cardiologist  BP Readings from Last 3 Encounters:  09/27/22 124/72  09/26/22 114/73  08/15/22 128/78    Renal function:   Lab Results  Component Value Date   CREATININE 1.3 03/02/2022   CREATININE 1.15 01/02/2022   CREATININE 1.44 08/03/2021    Most recent eye exam was in 2022, no report available from Dr. Harriette Bouillon  Currently known complications of diabetes: Mild neuropathy   LABS:  Office Visit on 09/27/2022  Component Date Value Ref Range Status   Hemoglobin A1C 08/01/2022 8.4   Final    Physical Examination:  BP 124/72 (BP Location: Left Arm, Patient Position: Sitting)   Pulse 60   Ht 6' (1.829 m)   Wt (!) 330 lb (149.7 kg)   SpO2 96%   BMI 44.76 kg/m           ASSESSMENT:  Diabetes type 2 with severe obesity  See history of present illness for detailed discussion of current diabetes management, blood sugar patterns and problems identified  His A1c is improved  at 7.2, was significantly higher in 5/24 with insulin alone  Current management is Ozempic 0.5 mg weekly only He has not been seen in follow-up since 12/23  He has had mild diabetes with severe obesity  He has been able to stop his insulin was starting Ozempic from his cardiologist  recently So far he has tolerated this better than Trulicity and only has mild diarrhea He has also previously refused to consider other diabetes medications However he is encouraged by his weight loss and improvement in his general wellbeing Blood sugar monitoring is somewhat inadequate and not checking after meals  Hypertension: Blood pressure is improved and looks fairly good today with reduced medications  PLAN:   He will continue Ozempic unchanged There is some confusion about whether he has already received his 1 mg Ozempic pen as seen on his medication list and he can likely go up onto this dose after 0.5 dose is finished However if he has more GI side effects he will have to go back to 0.5 Continue to stay off insulin Avoid eating just carbohydrates such as toast in the morning as he needs protein with this or skipping breakfast completely which he prefers Continue to increase physical activity To check blood sugars 2 hours after meals rather than fasting and bedtime Consider consultation with dietitian  Follow-up in 3 months  There are no Patient Instructions on file for this visit.  Total visit time for evaluation of his history, recent labs, current management and counseling = 30 minutes  Reather Littler 09/27/2022, 9:20 AM   Note: This office note was prepared with Dragon voice recognition system technology. Any transcriptional errors that result from this process are unintentional.

## 2022-09-29 ENCOUNTER — Other Ambulatory Visit: Payer: Self-pay | Admitting: Endocrinology

## 2022-11-03 ENCOUNTER — Other Ambulatory Visit: Payer: Self-pay | Admitting: Cardiology

## 2022-11-03 DIAGNOSIS — I10 Essential (primary) hypertension: Secondary | ICD-10-CM

## 2022-11-04 ENCOUNTER — Encounter (HOSPITAL_COMMUNITY): Payer: Self-pay

## 2022-11-05 ENCOUNTER — Other Ambulatory Visit: Payer: Self-pay | Admitting: Endocrinology

## 2022-11-05 DIAGNOSIS — E1169 Type 2 diabetes mellitus with other specified complication: Secondary | ICD-10-CM

## 2022-11-08 ENCOUNTER — Ambulatory Visit: Payer: Medicare Other | Admitting: Cardiology

## 2022-11-21 ENCOUNTER — Ambulatory Visit: Payer: Medicare Other | Admitting: Cardiology

## 2022-11-21 ENCOUNTER — Encounter: Payer: Self-pay | Admitting: Cardiology

## 2022-11-21 VITALS — BP 164/89 | HR 57 | Resp 16 | Ht 72.0 in | Wt 330.0 lb

## 2022-11-21 DIAGNOSIS — I10 Essential (primary) hypertension: Secondary | ICD-10-CM

## 2022-11-21 DIAGNOSIS — I4821 Permanent atrial fibrillation: Secondary | ICD-10-CM

## 2022-11-21 DIAGNOSIS — E1165 Type 2 diabetes mellitus with hyperglycemia: Secondary | ICD-10-CM

## 2022-11-21 DIAGNOSIS — T466X5A Adverse effect of antihyperlipidemic and antiarteriosclerotic drugs, initial encounter: Secondary | ICD-10-CM

## 2022-11-21 MED ORDER — METOPROLOL SUCCINATE ER 25 MG PO TB24
25.0000 mg | ORAL_TABLET | Freq: Every day | ORAL | Status: AC
Start: 2022-11-21 — End: ?

## 2022-11-21 MED ORDER — OLMESARTAN MEDOXOMIL-HCTZ 40-25 MG PO TABS
1.0000 | ORAL_TABLET | Freq: Every morning | ORAL | 3 refills | Status: DC
Start: 2022-11-21 — End: 2023-11-20

## 2022-11-21 NOTE — Progress Notes (Signed)
Primary Physician/Referring:  Ralene Ok, MD  Patient ID: William Cameron, male    DOB: 01/30/1951, 72 y.o.   MRN: [id number]  Chief Complaint  Patient presents with   Hypertension   Diabetes        Follow-up    6 weeks    HPI: William Cameron  is a 72 y.o. male male  with permanent atril fibrillation, hyperlipidemia with intolerance to statins, uncontrolled diabetes mellitus with stage 3a CKD, hypertension, morbid obesity with obstructive sleep apnea does not use CPAP regularly follows Dr. Vickey Huger, and severe 3 vessel CAD by coronary angiogram in Nov 2017 and recommended medical therapy due to diffuse disease.    Patient was started on Ozempic lost about 18 to 20 pounds in weight, however patient has discontinued the medication stating that he started having difficulty with eating anything and also started having bad thoughts in his mind.  In the interim he had injury to his left hand needing surgery and is in severe pain.  States that he does not want to go back to Ozempic or any new medications for now.  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    Shakes    Sleep apnea    cpap at night    Past Surgical History:  Procedure Laterality Date   CARDIOVERSION N/A 01/25/2016   Procedure: CARDIOVERSION;  Surgeon: Yates Decamp, MD;  Location: [facility] ENDOSCOPY;  Service: Cardiovascular;  Laterality: N/A;   COLONOSCOPY  02/02/2012   Procedure: COLONOSCOPY;  Surgeon: Theda Belfast, MD;  Location: [facility] ENDOSCOPY;  Service: Endoscopy;  Laterality:  N/A;   HERNIA REPAIR     HYDROCELE EXCISION / REPAIR     I & D EXTREMITY Left 11/24/2017   Procedure: IRRIGATION AND DEBRIDEMENT LEFT FOOT, REMOVAL OF TWO SCREWS, INSERTION OF ANTIBIOTIC BEADS;  Surgeon: Felecia Shelling, DPM;  Location: [facility] OR;  Service: Podiatry;  Laterality: Left;   JOINT REPLACEMENT     rt knee   ROTATOR CUFF REPAIR  rt   TOTAL HIP ARTHROPLASTY Right 07/14/2014   Procedure: RIGHT TOTAL HIP ARTHROPLASTY;  Surgeon: Durene Romans, MD;  Location: [facility] ORS;  Service: Orthopedics;  Laterality: Right;   Social History   Tobacco Use   Smoking status: Never   Smokeless tobacco: Never  Substance Use Topics   Alcohol use: No  marital Status: Widowed   Review of Systems  Cardiovascular:  Positive for dyspnea on exertion. Negative for chest pain and leg swelling.  Respiratory:  Positive for snoring.   Gastrointestinal:  Positive for diarrhea.  Neurological:  Positive for dizziness.   Objective  Blood pressure (!) 164/89, pulse (!) 57, resp. rate 16, height 6' (1.829 m), weight (!) 330 lb (149.7 kg), SpO2 96%. Body mass index is 44.76 kg/m.      11/21/2022   11:13 AM 11/21/2022   11:07 AM 09/27/2022    9:13 AM  Vitals with BMI  Height  6\' 0"  6\' 0"   Weight  330  lbs 330 lbs  BMI  44.75 44.75  Systolic 164 173 865  Diastolic 89 94 72  Pulse 57 56 60     Physical Exam Neck:     Vascular: No carotid bruit or JVD.  Cardiovascular:     Rate and Rhythm: Normal rate. Rhythm irregular.     Pulses: Normal pulses and intact distal pulses.          Dorsalis pedis pulses are 2+ on the right side and 2+ on the left side.       Posterior tibial pulses are 2+ on the right side and 2+ on the left side.     Heart sounds: Normal heart sounds. No murmur heard.    No gallop.  Pulmonary:     Effort: Pulmonary effort is normal.     Breath sounds: Normal breath sounds. No wheezing or rales.  Musculoskeletal:     Right lower leg: No edema.     Left lower leg: No edema.     Radiology:   Laboratory examination:   Lab Results  Component Value Date   NA 140 01/02/2022   K 4.3 01/02/2022   CO2 25 01/02/2022   GLUCOSE 117 (H) 01/02/2022   BUN 13 01/02/2022   CREATININE 1.3 03/02/2022   CALCIUM 9.9 01/02/2022   EGFR 68 01/02/2022   GFRNONAA 52 05/14/2019        Latest Ref Rng & Units 03/02/2022   12:00 AM 01/02/2022    1:11 PM 08/03/2021    8:50 AM  CMP  Glucose 70 - 99 mg/dL  784  696   BUN 8 - 27 mg/dL  13  21   Creatinine 0.6 - 1.3 1.3     1.15  1.44   Sodium 134 - 144 mmol/L  140  135   Potassium 3.5 - 5.2 mmol/L  4.3  4.0   Chloride 96 - 106 mmol/L  97  100   CO2 20 - 29 mmol/L  25  21   Calcium 8.6 - 10.2 mg/dL  9.9  9.8   Total Protein 6.0 - 8.3 g/dL   8.0   Total Bilirubin 0.2 - 1.2 mg/dL   0.7   Alkaline Phos 39 - 117 U/L   67   AST 0 - 37 U/L   27   ALT 0 - 53 U/L   18      This result is from an external source.      Latest Ref Rng & Units 11/27/2017    4:36 AM 11/26/2017    7:51 AM 11/25/2017    5:52 AM  CBC  WBC 4.0 - 10.5 K/uL 10.1  7.4  9.5   Hemoglobin 13.0 - 17.0 g/dL 29.5  28.4  13.2   Hematocrit 39.0 - 52.0 % 43.6  39.9  39.5   Platelets 150 - 400 K/uL 169  165  199    Lab Results  Component Value Date   CHOL 205 (H) 08/03/2021   HDL 41.40 08/03/2021   LDLCALC 142 (H) 08/03/2021   TRIG 111.0 08/03/2021   CHOLHDL 5 08/03/2021    HEMOGLOBIN A1C Lab Results  Component Value Date   HGBA1C 7.2 (A) 09/27/2022   MPG 171.42 10/08/2017   Lab Results  Component Value Date   TSH 1.01 05/11/2020   External labs:  Labs 05/31/2021:  Albumin to creatinine ratio 248 in the urine (<30/mg creatinine).  Total cholesterol 233, triglycerides 187, HDL 43, LDL 156.  Non-HDL cholesterol 190.  Serum glucose 126 mg, BUN 15, creatinine 1.06, EGFR 75 ml, potassium 4.3, LFTs normal.  Hb 15.3/HCT 47.3, platelets 166, normal indicis. Cardiac Studies:   Direct current cardioversion 01/25/2016: 120 x1, 150x1 to NSR.  Exercise  sestamibi stress test 12/27/2015: 1. Two day protocol followed. The resting electrocardiogram demonstrated normal sinus rhythm, incomplete RBBB and no resting arrhythmias.  Low voltage and  poor R progression. The stress electrocardiogram was normal. Patient exercised on Bruce protocol for 4:13 minutes and achieved  4.84 METS. Stress test terminated due to % MPHR achieved (Target HR >85%). Symptoms included dyspnea. 2. The perfusion imaging study demonstrates diaphragmatic attenuation artifact in the inferior wall. There is no demonstrable ischemia or scar. LV systolic function was normal at 64%. This is a low risk study. 01/24/2016: Impression: EKG 11/13/2017: Atrial fibrillation with controlled ventricular response at the rate of 66 bpm, leftward axis, poor R-wave progression, cannot exclude anterolateral infarct old. Low-voltage complexes. Nonspecific T abnormality. No significant change from EKG 11/15/2016.  Coronary angiogram 01/24/2016: Mid LAD occluded, D2 small to moderate sized proximal 80%. Mid Cx occluded with moderate OM1 which bifurcates. Both calcific CTO. RCA PL 1 has 80% stenosis (small) and PL3 occluded, mod-large vessel. LVEF 50-55% with apical akinesis.   PCV ECHOCARDIOGRAM COMPLETE 04/11/2021 Narrative Echocardiogram 04/11/2021: Normal LV systolic function with EF 62%. Left ventricle cavity is normal in size. Mild concentric remodeling of the left ventricle. Normal global wall motion. Doppler evidence of grade I (impaired) diastolic dysfunction, normal LAP. Calculated EF 62%. Left atrial cavity is moderately dilated by volume and measures at 5 cm. Right ventricle cavity is moderately dilated. Moderately reduced right ventricular function. Structurally normal tricuspid valve. No evidence of tricuspid stenosis. Mild to moderate tricuspid regurgitation. Moderate pulmonary hypertension. RVSP measures 46 mmHg. IVC is normal with poor inspiration collapse consistent with elevated right  atrial pressure. No significant change since 05/13/2018. TR was not noted previously.     EKG:  EKG 09/26/2022: Atrial fibrillation with controlled ventricular response at rate of 57 bpm, left anterior fascicular block.  Anterolateral infarct old.  Low-voltage complexes.  Consider pulmonary disease pattern.  Compared to 08/15/2022, no change. Allergies and medications:   Allergies  Allergen Reactions   Penicillins Anaphylaxis    Has patient had a PCN reaction causing immediate rash, facial/tongue/throat swelling, SOB or lightheadedness with hypotension: Yes Has patient had a PCN reaction causing severe rash involving mucus membranes or skin necrosis: Yes Has patient had a PCN reaction that required hospitalization Yes Has patient had a PCN reaction occurring within the last 10 years: No If all of the above answers are "NO", then may proceed with Cephalosporin use.   Tetanus Toxoids Anaphylaxis, Shortness Of Breath and Swelling   Sulfa Antibiotics Nausea And Vomiting and Swelling   Statins    Trulicity [Dulaglutide]     nausea    Current Outpatient Medications:    apixaban (ELIQUIS) 5 MG TABS tablet, Take 5 mg by mouth 2 (two) times daily., Disp: , Rfl:    Insulin Lispro Prot & Lispro (HUMALOG MIX 75/25 KWIKPEN) (75-25) 100 UNIT/ML Kwikpen, INJECT 10 UNITS SUBCUTANEOUSLY IN THE MORNING AND 20 BEFORE SUPPER, Disp: 15 mL, Rfl: 0   ONETOUCH VERIO test strip, CHECK BLOOD GLUCOSE TWICE DAILY, Disp: 200 strip, Rfl: 2   metoprolol succinate (TOPROL-XL) 25 MG 24 hr tablet, Take 1 tablet (25 mg total) by mouth daily., Disp: , Rfl:    olmesartan-hydrochlorothiazide (BENICAR HCT) 40-25 MG tablet, Take 1 tablet by mouth every  morning., Disp: 90 tablet, Rfl: 3   Assessment     ICD-10-CM   1. Permanent atrial fibrillation (HCC)  I48.21 metoprolol succinate (TOPROL-XL) 25 MG 24 hr tablet    2. Uncontrolled type 2 diabetes mellitus with hyperglycemia, with long-term current use of insulin (HCC)   E11.65    Z79.4     3. Primary hypertension  I10 olmesartan-hydrochlorothiazide (BENICAR HCT) 40-25 MG tablet    metoprolol succinate (TOPROL-XL) 25 MG 24 hr tablet    4. Statin myopathy  G72.0    T46.6X5A        CHA2DS2-VASc Score is 4.  Yearly risk of stroke: 4.8% (A, HTN, DM, Vasc Dz).  Score of 1=0.6; 2=2.2; 3=3.2; 4=4.8; 5=7.2; 6=9.8; 7=>9.8) -(CHF; HTN; vasc disease DM,  Male = 1; Age <65 =0; 65-74 = 1,  >75 =2; stroke/embolism= 2).    Meds ordered this encounter  Medications   olmesartan-hydrochlorothiazide (BENICAR HCT) 40-25 MG tablet    Sig: Take 1 tablet by mouth every morning.    Dispense:  90 tablet    Refill:  3   metoprolol succinate (TOPROL-XL) 25 MG 24 hr tablet    Sig: Take 1 tablet (25 mg total) by mouth daily.   Medications Discontinued During This Encounter  Medication Reason   Semaglutide, 1 MG/DOSE, 4 MG/3ML SOPN Side effect (s)   benazepril (LOTENSIN) 40 MG tablet    metoprolol succinate (TOPROL-XL) 25 MG 24 hr tablet    olmesartan-hydrochlorothiazide (BENICAR HCT) 40-25 MG tablet Reorder     No orders of the defined types were placed in this encounter.   Recommendations:   William Cameron  is a 72 y.o.  male  with permanent atril fibrillation, hyperlipidemia with intolerance to statins, uncontrolled diabetes mellitus with stage 3a CKD, hypertension, morbid obesity with obstructive sleep apnea does not use CPAP regularly follows Dr. Vickey Huger, and severe 3 vessel CAD by coronary angiogram in Nov 2017 and recommended medical therapy due to diffuse disease.    1. Permanent atrial fibrillation (HCC) Patient was started on Ozempic on his last office visit and he lost 17 pounds in weight and had to decrease his antihypertensive medications and was actually feeling well but decided to stop it as he felt very uneasy.  He does not want to try the medication again.  In the interim he has developed left hand mechanical injury during surgery.  We had reduced the  dose of metoprolol succinate with weight loss, will go back to 25 mg daily and previously his blood pressure and heart rate were well-controlled. - metoprolol succinate (TOPROL-XL) 25 MG 24 hr tablet; Take 1 tablet (25 mg total) by mouth daily.  2. Uncontrolled type 2 diabetes mellitus with hyperglycemia, with long-term current use of insulin (HCC) Patient does not want to try any new medications including oral GLP-1 agonist.  3. Primary hypertension Blood pressure is elevated today, patient is in severe pain from left hand surgery.  Previously blood pressure has been well-controlled.  I refilled his prescription for olmesartan HCT.  4.  Hypercholesterolemia Lipids are not being followed closely as he is statin intolerant, he does not want to try any injectables either.  States that he is doing well and would like to be left alone for now.  Otherwise stable from cardiac standpoint, I will see him back in 6 months for follow-up.    Yates Decamp, MD, [facility] 11/21/2022, 12:41 PM Office: [phone] Fax: [phone] Pager: [phone]

## 2022-11-28 ENCOUNTER — Ambulatory Visit: Payer: Medicare Other | Admitting: Endocrinology

## 2023-03-29 ENCOUNTER — Encounter: Payer: Self-pay | Admitting: Endocrinology

## 2023-03-29 ENCOUNTER — Ambulatory Visit: Payer: Medicare Other | Admitting: Endocrinology

## 2023-03-29 VITALS — BP 130/80 | HR 59 | Resp 20 | Ht 72.0 in | Wt 330.0 lb

## 2023-03-29 DIAGNOSIS — E1165 Type 2 diabetes mellitus with hyperglycemia: Secondary | ICD-10-CM

## 2023-03-29 DIAGNOSIS — Z794 Long term (current) use of insulin: Secondary | ICD-10-CM

## 2023-03-29 LAB — POCT GLYCOSYLATED HEMOGLOBIN (HGB A1C): Hemoglobin A1C: 8.7 % — AB (ref 4.0–5.6)

## 2023-03-29 MED ORDER — INSULIN LISPRO PROT & LISPRO (75-25 MIX) 100 UNIT/ML KWIKPEN: PEN_INJECTOR | SUBCUTANEOUS | 4 refills | Status: DC

## 2023-03-29 NOTE — Progress Notes (Signed)
 Outpatient Endocrinology Note Kandace Elrod, MD   Patient's Name: William Cameron    DOB: 1950/12/18    MRN: [id number]                                                    REASON OF VISIT: Follow up for type 2 diabetes mellitus  PCP: Valma Carwin, MD  HISTORY OF PRESENT ILLNESS:   William Cameron is a 73 y.o. old male with past medical history listed below, is here for follow up for type 2 diabetes mellitus.   Pertinent Diabetes History: Patient was previously seen by Dr. Von and was last time seen in July 2024.  Patient has uncontrolled type 2 diabetes mellitus.  Chronic Diabetes Complications : Retinopathy: no. Last ophthalmology exam was done on annually, following with ophthalmology regularly.  Nephropathy: no, on ACE/ARB /olmesartan . Peripheral neuropathy: ? Coronary artery disease: no Stroke: no  Relevant comorbidities and cardiovascular risk factors: Obesity: yes Body mass index is 44.76 kg/m.  Hypertension: Yes  Hyperlipidemia : Yes, not on statin : Managed by cardiology /PCP.  He had muscle and joint pains with Lipitor and also with Crestor  prescribed by PCP which he could not tolerate apparently. He had been on Repatha  previously    Previously has been told a couple of times to try fluvastatin  XL.  However he thinks he had some side effects which he does not remember and is not taking any medication. He was given a trial injection of Repatha  by the cardiologist but he thinks it caused him to have headaches and high blood pressure and refuses to consider any more medications. He is not taking the Zetia  that was recommended also  Current / Home Diabetic regimen includes:  Humalog  mix 75/25 : 10 units in the morning with breakfast and 20 units in the evening with supper, restarted 3 weeks ago.  Prior diabetic medications: Stop taking Ozempic  due to GI upset/nausea and headache. Had taken twice in the past he stopped due to nausea.  Farxiga  was stopped due to high cost in  the past.  No detail records of prior medication available to review.  Not sure about if he had taken metformin  in the past.  Previously has refused to take any medication for diabetes other than insulin  however he had taken Ozempic  from his cardiology in the past.  Glycemic data:   No glucometer data to review.  He forgot to bring glucometer in the clinic today.  He reports blood sugar is 109 in the morning yesterday.  Hypoglycemia: Patient has denies hypoglycemic episodes. Patient has hypoglycemia awareness.  Factors modifying glucose control: 1.  Diabetic diet assessment: 3 meals a day.  2.  Staying active or exercising: No formal exercise.  3.  Medication compliance: compliant most of the time.  Interval history  Patient restarted Humalog  mix about 3 weeks ago.  He had headache and had checked a blood sugar at that time was more than 200 and restarted insulin  from the leftover.  He had  stopped taking insulin  few months ago prior to July and was taking on Ozempic  at that time.  Patient later stopped taking Ozempic  as well due to GI upset and headache.  He reports he took Ozempic  for about 1 and half months and he stopped due to side effects.  He was not taking  any diabetic medication for several months prior to restarting Humalog  mix 3 weeks ago.  After being on Humalog  he has been monitoring blood sugar and reports improving and yesterday blood sugar was 109 in the morning fasting.  He denies numbness and ting of the feet.  No vision problem.  No other complaints today.  REVIEW OF SYSTEMS As per history of present illness.   PAST MEDICAL HISTORY: Past Medical History:  Diagnosis Date   Atrial fibrillation (HCC)    Bronchitis    chronic bronchitis   COPD (chronic obstructive pulmonary disease) (HCC)    Diabetes mellitus without complication (HCC)    Elevated lactic acid level    Hyperlipidemia    Hypertension    PONV (postoperative nausea and vomiting)    post op nausea    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    Shakes    Sleep apnea    cpap at night    PAST SURGICAL HISTORY: Past Surgical History:  Procedure Laterality Date   CARDIOVERSION N/A 01/25/2016   Procedure: CARDIOVERSION;  Surgeon: Gordy Bergamo, MD;  Location[facility] ENDOSCOPY;  Service: Cardiovascular;  Laterality: N/A;   COLONOSCOPY  02/02/2012   Procedure: COLONOSCOPY;  Surgeon: Belvie JONETTA Just, MD;  Location: [facility] ENDOSCOPY;  Service: Endoscopy;  Laterality: N/A;   HERNIA REPAIR     HYDROCELE EXCISION / REPAIR     I & D EXTREMITY Left 11/24/2017   Procedure: IRRIGATION AND DEBRIDEMENT LEFT FOOT, REMOVAL OF TWO SCREWS, INSERTION OF ANTIBIOTIC BEADS;  Surgeon: Janit Thresa HERO, DPM;  Location: [facility] OR;  Service: Podiatry;  Laterality: Left;   JOINT REPLACEMENT     rt knee   ROTATOR CUFF REPAIR  rt   TOTAL HIP ARTHROPLASTY Right 07/14/2014   Procedure: RIGHT TOTAL HIP ARTHROPLASTY;  Surgeon: Donnice Car, MD;  Location: [facility] ORS;  Service: Orthopedics;  Laterality: Right;    ALLERGIES: Allergies  Allergen Reactions   Penicillins Anaphylaxis    Has patient had a PCN reaction causing immediate rash, facial/tongue/throat swelling, SOB or lightheadedness with hypotension: Yes Has patient had a PCN reaction causing severe rash involving mucus membranes or skin necrosis: Yes Has patient had a PCN reaction that required hospitalization Yes Has patient had a PCN reaction occurring within the last 10 years: No If all of the above answers are NO, then may proceed with Cephalosporin use.   Tetanus Toxoids Anaphylaxis, Shortness Of Breath and Swelling   Sulfa Antibiotics Nausea And Vomiting and Swelling   Statins    Trulicity [Dulaglutide]     nausea    FAMILY HISTORY:  Family History  Problem Relation Age of Onset   Diabetes type II Mother    Stroke Mother 78   High Cholesterol Mother     Prostate cancer Father 87    SOCIAL HISTORY: Social History   Socioeconomic History   Marital status: Widowed    Spouse name: Joen   Number of children: 1   Years of education: 12+   Highest education level: Not on file  Occupational History    Employer: [employer]  Tobacco Use   Smoking status: Never   Smokeless tobacco: Never  Vaping Use   Vaping status: Never Used  Substance and Sexual Activity   Alcohol use: No   Drug use: No   Sexual activity: Never  Other Topics Concern   Not on  file  Social History Narrative   Patient is married [place]) and lives at home with his wife.   Patient has one adult child.   Patient is working full-time.   Patient has a high school and auto classes.   Patient is right-handed.   Patient drinks two cups of coffee every morning and very little tea.   Social Drivers of Corporate Investment Banker Strain: Not on file  Food Insecurity: Not on file  Transportation Needs: Not on file  Physical Activity: Not on file  Stress: Not on file  Social Connections: Not on file    MEDICATIONS:  Current Outpatient Medications  Medication Sig Dispense Refill   apixaban  (ELIQUIS ) 5 MG TABS tablet Take 5 mg by mouth 2 (two) times daily.     metoprolol  succinate (TOPROL -XL) 25 MG 24 hr tablet Take 1 tablet (25 mg total) by mouth daily.     olmesartan -hydrochlorothiazide (BENICAR  HCT) 40-25 MG tablet Take 1 tablet by mouth every morning. 90 tablet 3   ONETOUCH VERIO test strip CHECK BLOOD GLUCOSE TWICE DAILY 200 strip 2   Insulin  Lispro Prot & Lispro (HUMALOG  MIX 75/25 KWIKPEN) (75-25) 100 UNIT/ML Kwikpen INJECT 10 UNITS SUBCUTANEOUSLY IN THE MORNING AND 20 BEFORE SUPPER 30 mL 4   No current facility-administered medications for this visit.    PHYSICAL EXAM: Vitals:   03/29/23 1041  BP: 130/80  Pulse: (!) 59  Resp: 20  SpO2: 98%  Weight: (!) 330 lb (149.7 kg)  Height: 6' (1.829 m)   Body mass index is 44.76 kg/m.  Wt Readings from  Last 3 Encounters:  03/29/23 (!) 330 lb (149.7 kg)  11/21/22 (!) 330 lb (149.7 kg)  09/27/22 (!) 330 lb (149.7 kg)    General: Well developed, well nourished male in no apparent distress.  HEENT: AT/[place], no external lesions.  Eyes: Conjunctiva clear and no icterus. Neck: Neck supple  Lungs: Respirations not labored Neurologic: Alert, oriented, normal speech Extremities / Skin: Dry. No sores or rashes noted.  Psychiatric: Does not appear depressed or anxious  Diabetic Foot Exam - Simple   Simple Foot Form Diabetic Foot exam was performed with the following findings: Yes 03/29/2023 11:48 AM  Visual Inspection See comments: Yes Sensation Testing Pulse Check Comments Patient declined today.  Patient reports being performed by primary care provider, annually.     Diabetic Foot Examination : Patient reports being performed by primary care provider,  annually.  LABS Reviewed Lab Results  Component Value Date   HGBA1C 8.7 (A) 03/29/2023   HGBA1C 7.2 (A) 09/27/2022   HGBA1C 8.4 08/01/2022   No results found for: FRUCTOSAMINE Lab Results  Component Value Date   CHOL 205 (H) 08/03/2021   HDL 41.40 08/03/2021   LDLCALC 142 (H) 08/03/2021   TRIG 111.0 08/03/2021   CHOLHDL 5 08/03/2021   Lab Results  Component Value Date   MICRALBCREAT 9.7 03/07/2022   MICRALBCREAT 8.5 02/23/2020   Lab Results  Component Value Date   CREATININE 1.3 03/02/2022   Lab Results  Component Value Date   GFR 48.98 (L) 08/03/2021    ASSESSMENT / PLAN  1. Uncontrolled type 2 diabetes mellitus with hyperglycemia, with long-term current use of insulin  (HCC)     Diabetes Mellitus type 2, complicated by no known complications. - Diabetic status / severity: Uncontrolled.  Lab Results  Component Value Date   HGBA1C 8.7 (A) 03/29/2023    - Hemoglobin A1c goal : <7%  Patient was not on diabetic  medications for several months, restarted Humalog  mix 3 weeks ago.  He stopped taking Ozempic  due  to GI symptoms and headache few months ago.  Discussed about importance of compliance with diabetic medication and importance of controlling diabetes to prevent chronic complications.  - Medications: See below.  I) continue Humalog  mix 75/25 : 10 units with breakfast and 20 units with supper.  - Home glucose testing: Before breakfast, supper and at bedtime.  Asked to bring glucometer in the follow-up visit. - Discussed/ Gave Hypoglycemia treatment plan.  # Consult : not required at this time.   # Annual urine for microalbuminuria/ creatinine ratio, no microalbuminuria currently, continue ACE/ARB /olmesartan .  Not able to give urine sample today for the lab.  Will check in next follow-up visit. Last  Lab Results  Component Value Date   MICRALBCREAT 9.7 03/07/2022    # Foot check nightly.  # Annual dilated diabetic eye exams.   - Diet: Make healthy diabetic food choices - Life style / activity / exercise: Discussed.  2. Blood pressure  -  BP Readings from Last 1 Encounters:  03/29/23 130/80    - Control is in target.  - No change in current plans.  3. Lipid status / Hyperlipidemia - Last  Lab Results  Component Value Date   LDLCALC 142 (H) 08/03/2021   -Not on a statin.  Detail in HPI.  Managed by PCP/cardiology.  Discussed about lab BMP with EGFR, lipid panel and urine microalbumin creatinine ratio.  Patient wants to complete with primary care provider and send the results to our clinic.  Diagnoses and all orders for this visit:  Uncontrolled type 2 diabetes mellitus with hyperglycemia, with long-term current use of insulin  (HCC) -     POCT glycosylated hemoglobin (Hb A1C)  Other orders -     Insulin  Lispro Prot & Lispro (HUMALOG  MIX 75/25 KWIKPEN) (75-25) 100 UNIT/ML Kwikpen; INJECT 10 UNITS SUBCUTANEOUSLY IN THE MORNING AND 20 BEFORE SUPPER    DISPOSITION Follow up in clinic in 3  months suggested.   All questions answered and patient verbalized  understanding of the plan.  Briah Nary, MD [facility] Endocrinology [facility] [address] [place], [address] [place], [place] [address] Phone # [phone]  At least part of this note was generated using voice recognition software. Inadvertent word errors may have occurred, which were not recognized during the proofreading process.

## 2023-05-24 ENCOUNTER — Encounter: Payer: Self-pay | Admitting: Cardiology

## 2023-05-24 ENCOUNTER — Ambulatory Visit: Payer: Medicare Other | Attending: Cardiology | Admitting: Cardiology

## 2023-05-24 VITALS — BP 122/76 | HR 65 | Resp 16 | Ht 72.0 in | Wt 333.8 lb

## 2023-05-24 DIAGNOSIS — G720 Drug-induced myopathy: Secondary | ICD-10-CM

## 2023-05-24 DIAGNOSIS — I4821 Permanent atrial fibrillation: Secondary | ICD-10-CM | POA: Diagnosis not present

## 2023-05-24 DIAGNOSIS — I25118 Atherosclerotic heart disease of native coronary artery with other forms of angina pectoris: Secondary | ICD-10-CM | POA: Diagnosis not present

## 2023-05-24 DIAGNOSIS — T466X5D Adverse effect of antihyperlipidemic and antiarteriosclerotic drugs, subsequent encounter: Secondary | ICD-10-CM

## 2023-05-24 DIAGNOSIS — D6869 Other thrombophilia: Secondary | ICD-10-CM

## 2023-05-24 DIAGNOSIS — I10 Essential (primary) hypertension: Secondary | ICD-10-CM | POA: Diagnosis not present

## 2023-05-24 NOTE — Patient Instructions (Signed)
 Medication Instructions:  Your physician recommends that you continue on your current medications as directed. Please refer to the Current Medication list given to you today.  *If you need a refill on your cardiac medications before your next appointment, please call your pharmacy*   Lab Work: none If you have labs (blood work) drawn today and your tests are completely normal, you will receive your results only by: MyChart Message (if you have MyChart) OR A paper copy in the mail If you have any lab test that is abnormal or we need to change your treatment, we will call you to review the results.   Testing/Procedures: none   Follow-Up: At [facility], you and your health needs are our priority.  As part of our continuing mission to provide you with exceptional heart care, we have created designated Provider Care Teams.  These Care Teams include your primary Cardiologist (physician) and Advanced Practice Providers (APPs -  Physician Assistants and Nurse Practitioners) who all work together to provide you with the care you need, when you need it.  We recommend signing up for the patient portal called "MyChart".  Sign up information is provided on this After Visit Summary.  MyChart is used to connect with patients for Virtual Visits (Telemedicine).  Patients are able to view lab/test results, encounter notes, upcoming appointments, etc.  Non-urgent messages can be sent to your provider as well.   To learn more about what you can do with MyChart, go to [email or web address].    Your next appointment:   6 month(s)  Provider:   Yates Decamp, MD     Other Instructions
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
## 2023-06-27 ENCOUNTER — Encounter: Payer: Self-pay | Admitting: Endocrinology

## 2023-06-27 ENCOUNTER — Ambulatory Visit: Payer: Medicare Other | Admitting: Endocrinology

## 2023-06-27 VITALS — BP 136/90 | HR 56 | Resp 16 | Ht 72.0 in | Wt 336.6 lb

## 2023-06-27 DIAGNOSIS — Z794 Long term (current) use of insulin: Secondary | ICD-10-CM | POA: Diagnosis not present

## 2023-06-27 DIAGNOSIS — E1165 Type 2 diabetes mellitus with hyperglycemia: Secondary | ICD-10-CM | POA: Diagnosis not present

## 2023-06-27 LAB — POCT GLYCOSYLATED HEMOGLOBIN (HGB A1C): Hemoglobin A1C: 7.6 % — AB (ref 4.0–5.6)

## 2023-06-27 NOTE — Progress Notes (Signed)
 Outpatient Endocrinology Note [place] Amalia Edgecombe, MD   Patient's Name: William Cameron    DOB: 1950/04/23    MRN: [id number]                                                    REASON OF VISIT: Follow up for type 2 diabetes mellitus  PCP: Ralene Ok, MD  HISTORY OF PRESENT ILLNESS:   William Cameron is a 73 y.o. old male with past medical history listed below, is here for follow up for type 2 diabetes mellitus.   Pertinent Diabetes History: Patient was previously seen by Dr. Lucianne Muss and was last time seen in July 2024.  Patient has uncontrolled type 2 diabetes mellitus.  Chronic Diabetes Complications : Retinopathy: no. Last ophthalmology exam was done on annually, following with ophthalmology regularly.  Nephropathy: no, on ACE/ARB /olmesartan. Peripheral neuropathy: ? Coronary artery disease: no Stroke: no  Relevant comorbidities and cardiovascular risk factors: Obesity: yes Body mass index is 45.65 kg/m.  Hypertension: Yes  Hyperlipidemia : Yes, not on statin : Managed by cardiology /PCP.  He had muscle and joint pains with Lipitor and also with Crestor prescribed by PCP which he could not tolerate apparently. He had been on Repatha previously    Previously has been told a couple of times to try fluvastatin XL.  However he thinks he had some side effects which he does not remember and is not taking any medication. He was given a trial injection of Repatha by the cardiologist but he thinks it caused him to have headaches and high blood pressure and refuses to consider any more medications. He is not taking the Zetia that was recommended also  Current / Home Diabetic regimen includes:  Humalog mix 75/25 : 10 units in the morning with breakfast and 20 units in the evening with supper.  Prior diabetic medications: Stop taking Ozempic due to GI upset/nausea and headache. Had taken twice in the past he stopped due to nausea.  Marcelline Deist was stopped due to high cost in the past.  No detail  records of prior medication available to review.  Not sure about if he had taken metformin in the past.  Previously has refused to take any medication for diabetes other than insulin however he had taken Ozempic from his cardiology in the past.  Glycemic data:   One Touch Verio flex glucometer download from March 19 to June 27, 2023, average blood sugar 153.  He has been taking 1-2 times a day.  Fasting blood sugar 150, 161, 128, 155, 188, 130.  Blood sugar around evening 178, 120, 122, 121, blood sugar in the afternoon 139, 151.  Mostly acceptable blood sugar no significant hyperglycemia.  Hypoglycemia: Patient has denies hypoglycemic episodes. Patient has hypoglycemia awareness.  Factors modifying glucose control: 1.  Diabetic diet assessment: 3 meals a day.  2.  Staying active or exercising: No formal exercise.  3.  Medication compliance: compliant most of the time.  Interval history  Glucometer data as reviewed above.  Mostly acceptable blood sugar.  Hemoglobin A1c improved to 7.6%.  He has been taking insulin Humalog mix 75/25, taking sometime before meals sometime after meals.  However he reports compliance with it.  Recently he is having cold symptoms and taking doxycycline.  No other complaints today.   REVIEW OF SYSTEMS As  per history of present illness.   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    Shakes    Sleep apnea    cpap at night    PAST SURGICAL HISTORY: Past Surgical History:  Procedure  Laterality Date   CARDIOVERSION N/A 01/25/2016   Procedure: CARDIOVERSION;  Surgeon: Yates Decamp, MD;  [facility] ENDOSCOPY;  Service: Cardiovascular;  Laterality: N/A;   COLONOSCOPY  02/02/2012   Procedure: COLONOSCOPY;  Surgeon: Theda Belfast, MD;  Location: [facility] ENDOSCOPY;  Service: Endoscopy;  Laterality: N/A;   HERNIA REPAIR     HYDROCELE EXCISION / REPAIR     I & D EXTREMITY Left 11/24/2017   Procedure: IRRIGATION AND DEBRIDEMENT LEFT FOOT, REMOVAL OF TWO SCREWS, INSERTION OF ANTIBIOTIC BEADS;  Surgeon: Felecia Shelling, DPM;  Location: [facility] OR;  Service: Podiatry;  Laterality: Left;   JOINT REPLACEMENT     rt knee   ROTATOR CUFF REPAIR  rt   TOTAL HIP ARTHROPLASTY Right 07/14/2014   Procedure: RIGHT TOTAL HIP ARTHROPLASTY;  Surgeon: Durene Romans, MD;  Location: [facility] ORS;  Service: Orthopedics;  Laterality: Right;    ALLERGIES: Allergies  Allergen Reactions   Penicillins Anaphylaxis    Has patient had a PCN reaction causing immediate rash, facial/tongue/throat swelling, SOB or lightheadedness with hypotension: Yes Has patient had a PCN reaction causing severe rash involving mucus membranes or skin necrosis: Yes Has patient had a PCN reaction that required hospitalization Yes Has patient had a PCN reaction occurring within the last 10 years: No If all of the above answers are "NO", then may proceed with Cephalosporin use.   Tetanus Toxoids Anaphylaxis, Shortness Of Breath and Swelling   Sulfa Antibiotics Nausea And Vomiting and Swelling   Statins    Trulicity [Dulaglutide]     nausea    FAMILY HISTORY:  Family History  Problem Relation Age of Onset   Diabetes type II Mother    Stroke Mother 42   High Cholesterol Mother    Prostate cancer Father 87    SOCIAL HISTORY: Social History   Socioeconomic History   Marital status: Widowed    Spouse name: [name]   Number of children: 1   Years of education: 12+   Highest education level: Not on file  Occupational History     Employer: [employer]  Tobacco Use   Smoking status: Never   Smokeless tobacco: Never  Vaping Use   Vaping status: Never Used  Substance and Sexual Activity   Alcohol use: No   Drug use: No   Sexual activity: Never  Other Topics Concern   Not on file  Social History Narrative   Patient is married [name]) and lives at home with his wife.   Patient has one adult child.   Patient is working full-time.   Patient has a high school and auto classes.   Patient is right-handed.   Patient drinks two cups of coffee every morning and very little tea.  Social Drivers of Corporate investment banker Strain: Not on file  Food Insecurity: Not on file  Transportation Needs: Not on file  Physical Activity: Not on file  Stress: Not on file  Social Connections: Not on file    MEDICATIONS:  Current Outpatient Medications  Medication Sig Dispense Refill   apixaban (ELIQUIS) 5 MG TABS tablet Take 5 mg by mouth 2 (two) times daily.     Insulin Lispro Prot & Lispro (HUMALOG MIX 75/25 KWIKPEN) (75-25) 100 UNIT/ML Kwikpen INJECT 10 UNITS SUBCUTANEOUSLY IN THE MORNING AND 20 BEFORE SUPPER 30 mL 4   metoprolol succinate (TOPROL-XL) 25 MG 24 hr tablet Take 1 tablet (25 mg total) by mouth daily.     ONETOUCH VERIO test strip CHECK BLOOD GLUCOSE TWICE DAILY 200 strip 2   olmesartan-hydrochlorothiazide (BENICAR HCT) 40-25 MG tablet Take 1 tablet by mouth every morning. 90 tablet 3   No current facility-administered medications for this visit.    PHYSICAL EXAM: Vitals:   06/27/23 0823 06/27/23 0825  BP: (!) 142/90 (!) 136/90  Pulse: (!) 56   Resp: 16   SpO2: 98%   Weight: (!) 336 lb 9.6 oz (152.7 kg)   Height: 6' (1.829 m)     Body mass index is 45.65 kg/m.  Wt Readings from Last 3 Encounters:  06/27/23 (!) 336 lb 9.6 oz (152.7 kg)  05/24/23 (!) 333 lb 12.8 oz (151.4 kg)  03/29/23 (!) 330 lb (149.7 kg)    General: Well developed, well nourished male in no apparent distress.  HEENT:  AT/, no external lesions.  Eyes: Conjunctiva clear and no icterus. Neck: Neck supple  Lungs: Respirations not labored Neurologic: Alert, oriented, normal speech Extremities / Skin: Dry. No sores or rashes noted.  Psychiatric: Does not appear depressed or anxious  Diabetic Foot Exam - Simple   No data filed     Diabetic Foot Examination : Patient reports being performed by primary care provider,  annually.  LABS Reviewed Lab Results  Component Value Date   HGBA1C 7.6 (A) 06/27/2023   HGBA1C 8.7 (A) 03/29/2023   HGBA1C 7.2 (A) 09/27/2022   No results found for: "FRUCTOSAMINE" Lab Results  Component Value Date   CHOL 205 (H) 08/03/2021   HDL 41.40 08/03/2021   LDLCALC 142 (H) 08/03/2021   TRIG 111.0 08/03/2021   CHOLHDL 5 08/03/2021   Lab Results  Component Value Date   MICRALBCREAT 9.7 03/07/2022   MICRALBCREAT 8.5 02/23/2020   Lab Results  Component Value Date   CREATININE 1.3 03/02/2022   Lab Results  Component Value Date   GFR 48.98 (L) 08/03/2021    ASSESSMENT / PLAN  1. Uncontrolled type 2 diabetes mellitus with hyperglycemia, with long-term current use of insulin (HCC)    Diabetes Mellitus type 2, complicated by no known complications. - Diabetic status / severity: Uncontrolled.  Lab Results  Component Value Date   HGBA1C 7.6 (A) 06/27/2023    - Hemoglobin A1c goal : <6.5%  Diabetes control improving.  Glucometer data mostly acceptable.  - Medications: See below.  I) continue Humalog mix 75/25 : 10 units with breakfast and 20 units with supper.  Asked to take before meals.  - Home glucose testing: Before breakfast, supper and at bedtime.  Asked to bring glucometer in the follow-up visit. - Discussed/ Gave Hypoglycemia treatment plan.  # Consult : not required at this time.   # Annual urine for microalbuminuria/ creatinine ratio, no microalbuminuria currently, continue ACE/ARB /olmesartan.  Patient  reports he has been checking with primary  care provider regularly.   Last  Lab Results  Component Value Date   MICRALBCREAT 9.7 03/07/2022    # Foot check nightly.  # Annual dilated diabetic eye exams.   - Diet: Make healthy diabetic food choices - Life style / activity / exercise: Discussed.  2. Blood pressure  -  BP Readings from Last 1 Encounters:  06/27/23 (!) 136/90    - Control is in target.  - No change in current plans.  3. Lipid status / Hyperlipidemia - Last  Lab Results  Component Value Date   LDLCALC 142 (H) 08/03/2021   -Not on a statin.  Detail in HPI.  Managed by PCP/cardiology.   Diagnoses and all orders for this visit:  Uncontrolled type 2 diabetes mellitus with hyperglycemia, with long-term current use of insulin (HCC) -     POCT glycosylated hemoglobin (Hb A1C)     DISPOSITION Follow up in clinic in 3  months suggested.   All questions answered and patient verbalized understanding of the plan.  [place] Adoria Kawamoto, MD [facility] Endocrinology [facility] [address] [place], [address] [place], [place] [address] Phone # [phone]  At least part of this note was generated using voice recognition software. Inadvertent word errors may have occurred, which were not recognized during the proofreading process.

## 2023-08-15 LAB — LAB REPORT - SCANNED
A1c: 8.3
Albumin, Urine POC: 1.2
Albumin/Creatinine Ratio, Urine, POC: 19
Creatinine, POC: 63 mg/dL
EGFR: 51

## 2023-09-19 ENCOUNTER — Ambulatory Visit: Admitting: Endocrinology

## 2023-09-19 ENCOUNTER — Encounter: Payer: Self-pay | Admitting: Endocrinology

## 2023-09-19 ENCOUNTER — Ambulatory Visit: Payer: Self-pay | Admitting: Endocrinology

## 2023-09-19 DIAGNOSIS — E1165 Type 2 diabetes mellitus with hyperglycemia: Secondary | ICD-10-CM

## 2023-09-19 DIAGNOSIS — Z794 Long term (current) use of insulin: Secondary | ICD-10-CM | POA: Diagnosis not present

## 2023-09-19 LAB — POCT GLYCOSYLATED HEMOGLOBIN (HGB A1C): Hemoglobin A1C: 8 % — AB (ref 4.0–5.6)

## 2023-09-19 NOTE — Progress Notes (Signed)
 Outpatient Endocrinology Note [place] Love Chowning, MD   Patient's Name: William Cameron    DOB: September 11, 1950    MRN: [id number]                                                    REASON OF VISIT: Follow up for type 2 diabetes mellitus  PCP: Valma Carwin, MD  HISTORY OF PRESENT ILLNESS:   William Cameron is a 73 y.o. old male with past medical history listed below, is here for follow up for type 2 diabetes mellitus.   Pertinent Diabetes History: Patient was previously seen by Dr. Von and was last time seen in July 2024.  Patient has uncontrolled type 2 diabetes mellitus.  Chronic Diabetes Complications : Retinopathy: no. Last ophthalmology exam was done on annually, following with ophthalmology regularly.  Nephropathy: no, on ACE/ARB /olmesartan . Peripheral neuropathy: ? Coronary artery disease: no Stroke: no  Relevant comorbidities and cardiovascular risk factors: Obesity: yes There is no height or weight on file to calculate BMI.  Hypertension: Yes  Hyperlipidemia : Yes, not on statin : Managed by cardiology /PCP.  He had muscle and joint pains with Lipitor and also with Crestor  prescribed by PCP which he could not tolerate apparently. He had been on Repatha  previously    Previously has been told a couple of times to try fluvastatin  XL.  However he thinks he had some side effects which he does not remember and is not taking any medication. He was given a trial injection of Repatha  by the cardiologist but he thinks it caused him to have headaches and high blood pressure and refuses to consider any more medications. He is not taking the Zetia  that was recommended also  Current / Home Diabetic regimen includes:  Humalog  mix 75/25 : 10 units in the morning with breakfast and 20 units in the evening with supper.  Prior diabetic medications: Stop taking Ozempic  due to GI upset/nausea and headache. Had taken twice in the past he stopped due to nausea.  Farxiga  was stopped due to high cost in  the past.  No detail records of prior medication available to review.  Not sure about if he had taken metformin  in the past.  Previously has refused to take any medication for diabetes other than insulin  however he had taken Ozempic  from his cardiology in the past.  Glycemic data:    One Touch Verio flex glucometer download from June 11 to June 25 , 2025, average blood sugar 146.  He has been taking 1-2 times a day.  Fasting blood sugar 155, 137, 129, 146, 134.  Blood sugar in the evening 110, 133.  Mostly acceptable blood sugar.  No hypoglycemia.  Hypoglycemia: Patient has denies hypoglycemic episodes. Patient has hypoglycemia awareness.  Factors modifying glucose control: 1.  Diabetic diet assessment: 3 meals a day.  2.  Staying active or exercising: No formal exercise.  3.  Medication compliance: compliant most of the time.  Interval history  Hemoglobin A1c 8%.  Patient had hemoglobin A1c of 8.3% in May checked with outside provider.  Patient reports he had been to [place]  in [place]  for few weeks in between visits, did not take insulin  and was not taking insulin  during those weeks.  Diabetes regimen as reviewed and noted above.  Mostly acceptable blood sugar on the glucometer.  He reports he has been quite busy with mowing the grass few hours every day.  No other complaints today.   REVIEW OF SYSTEMS As per history of present illness.   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    Shakes    Sleep apnea     cpap at night    PAST SURGICAL HISTORY: Past Surgical History:  Procedure Laterality Date   CARDIOVERSION N/A 01/25/2016   Procedure: CARDIOVERSION;  Surgeon: Gordy Bergamo, MD;  Location: [facility] ENDOSCOPY;  Service: Cardiovascular;  Laterality: N/A;   COLONOSCOPY  02/02/2012   Procedure: COLONOSCOPY;  Surgeon: Belvie JONETTA Just, MD;  Location: [facility] ENDOSCOPY;  Service: Endoscopy;  Laterality: N/A;   HERNIA REPAIR     HYDROCELE EXCISION / REPAIR     I & D EXTREMITY Left 11/24/2017   Procedure: IRRIGATION AND DEBRIDEMENT LEFT FOOT, REMOVAL OF TWO SCREWS, INSERTION OF ANTIBIOTIC BEADS;  Surgeon: Janit Thresa HERO, DPM;  Location: [facility] OR;  Service: Podiatry;  Laterality: Left;   JOINT REPLACEMENT     rt knee   ROTATOR CUFF REPAIR  rt   TOTAL HIP ARTHROPLASTY Right 07/14/2014   Procedure: RIGHT TOTAL HIP ARTHROPLASTY;  Surgeon: Donnice Car, MD;  Location: [facility] ORS;  Service: Orthopedics;  Laterality: Right;    ALLERGIES: Allergies  Allergen Reactions   Penicillins Anaphylaxis    Has patient had a PCN reaction causing immediate rash, facial/tongue/throat swelling, SOB or lightheadedness with hypotension: Yes Has patient had a PCN reaction causing severe rash involving mucus membranes or skin necrosis: Yes Has patient had a PCN reaction that required hospitalization Yes Has patient had a PCN reaction occurring within the last 10 years: No If all of the above answers are NO, then may proceed with Cephalosporin use.   Tetanus Toxoids Anaphylaxis, Shortness Of Breath and Swelling   Sulfa Antibiotics Nausea And Vomiting and Swelling   Statins    Trulicity [Dulaglutide]     nausea    FAMILY HISTORY:  Family History  Problem Relation Age of Onset   Diabetes type II Mother    Stroke Mother 74   High Cholesterol Mother    Prostate cancer Father 56    SOCIAL HISTORY: Social History   Socioeconomic History   Marital status: Widowed    Spouse name: Joen   Number of children: 1   Years of  education: 12+   Highest education level: Not on file  Occupational History    Employer: [employer]  Tobacco Use   Smoking status: Never   Smokeless tobacco: Never  Vaping Use   Vaping status: Never Used  Substance and Sexual Activity   Alcohol use: No   Drug use: No   Sexual activity: Never  Other Topics Concern   Not on file  Social History Narrative   Patient is married [place]) and lives at home with his wife.   Patient has one adult child.   Patient is working full-time.   Patient has  a high school and auto classes.   Patient is right-handed.   Patient drinks two cups of coffee every morning and very little tea.   Social Drivers of Corporate investment banker Strain: Not on file  Food Insecurity: Not on file  Transportation Needs: Not on file  Physical Activity: Not on file  Stress: Not on file  Social Connections: Not on file    MEDICATIONS:  Current Outpatient Medications  Medication Sig Dispense Refill   apixaban  (ELIQUIS ) 5 MG TABS tablet Take 5 mg by mouth 2 (two) times daily.     Insulin  Lispro Prot & Lispro (HUMALOG  MIX 75/25 KWIKPEN) (75-25) 100 UNIT/ML Kwikpen INJECT 10 UNITS SUBCUTANEOUSLY IN THE MORNING AND 20 BEFORE SUPPER 30 mL 4   metoprolol  succinate (TOPROL -XL) 25 MG 24 hr tablet Take 1 tablet (25 mg total) by mouth daily.     olmesartan -hydrochlorothiazide (BENICAR  HCT) 40-25 MG tablet Take 1 tablet by mouth every morning. 90 tablet 3   ONETOUCH VERIO test strip CHECK BLOOD GLUCOSE TWICE DAILY 200 strip 2   No current facility-administered medications for this visit.    PHYSICAL EXAM: There were no vitals filed for this visit.   There is no height or weight on file to calculate BMI.  Wt Readings from Last 3 Encounters:  06/27/23 (!) 336 lb 9.6 oz (152.7 kg)  05/24/23 (!) 333 lb 12.8 oz (151.4 kg)  03/29/23 (!) 330 lb (149.7 kg)    General: Well developed, well nourished male in no apparent distress.  HEENT: AT/Ione, no external lesions.   Eyes: Conjunctiva clear and no icterus. Neck: Neck supple  Lungs: Respirations not labored Neurologic: Alert, oriented, normal speech Extremities / Skin: Dry.  Psychiatric: Does not appear depressed or anxious  Diabetic Foot Exam - Simple   No data filed     Diabetic Foot Examination : Patient reports being performed by primary care provider,  annually.  LABS Reviewed Lab Results  Component Value Date   HGBA1C 8.0 (A) 09/19/2023   HGBA1C 7.6 (A) 06/27/2023   HGBA1C 8.7 (A) 03/29/2023   No results found for: FRUCTOSAMINE Lab Results  Component Value Date   CHOL 205 (H) 08/03/2021   HDL 41.40 08/03/2021   LDLCALC 142 (H) 08/03/2021   TRIG 111.0 08/03/2021   CHOLHDL 5 08/03/2021   Lab Results  Component Value Date   MICRALBCREAT 19 08/15/2023   MICRALBCREAT 9.7 03/07/2022   Lab Results  Component Value Date   CREATININE 1.3 03/02/2022   Lab Results  Component Value Date   GFR 48.98 (L) 08/03/2021    ASSESSMENT / PLAN  1. Uncontrolled type 2 diabetes mellitus with hyperglycemia, with long-term current use of insulin  (HCC)    Diabetes Mellitus type 2, complicated by no other known complications. - Diabetic status / severity: Uncontrolled.  Lab Results  Component Value Date   HGBA1C 8.0 (A) 09/19/2023    - Hemoglobin A1c goal : <6.5%  Diabetes control improving.  Glucometer data mostly acceptable.  He has worsening diabetes control, reports he did not take insulin  for few weeks when he was traveling to [place]  in [place] .  Encouraged for compliance and make a plan to take insulin  all the time even during travel.  - Medications: See below.  No change.  I) continue Humalog  mix 75/25 : 10 units with breakfast and 20 units with supper.  Asked to take before meals.  - Home glucose testing: Before breakfast, supper and at bedtime.   - Discussed/  Gave Hypoglycemia treatment plan.  # Consult : not required at this time.   # Annual urine for  microalbuminuria/ creatinine ratio, no microalbuminuria currently, continue ACE/ARB /olmesartan .   Lab Results  Component Value Date   MICRALBCREAT 19 08/15/2023    # Foot check nightly.  # Annual dilated diabetic eye exams.   - Diet: Make healthy diabetic food choices - Life style / activity / exercise: Discussed.  2. Blood pressure  -  BP Readings from Last 1 Encounters:  06/27/23 (!) 136/90    - Control is in target.  - No change in current plans.  3. Lipid status / Hyperlipidemia - Last  Lab Results  Component Value Date   LDLCALC 142 (H) 08/03/2021   -Not on a statin.  Detail in HPI.  Managed by PCP/cardiology.   Diagnoses and all orders for this visit:  Uncontrolled type 2 diabetes mellitus with hyperglycemia, with long-term current use of insulin  (HCC) -     POCT glycosylated hemoglobin (Hb A1C)    DISPOSITION Follow up in clinic in 3  months suggested.   All questions answered and patient verbalized understanding of the plan.  [place] Kamorah Nevils, MD [facility] Endocrinology [facility] [address] [place], [address] [place], [place] [address] Phone # [phone]  At least part of this note was generated using voice recognition software. Inadvertent word errors may have occurred, which were not recognized during the proofreading process.

## 2023-11-18 ENCOUNTER — Other Ambulatory Visit: Payer: Self-pay | Admitting: Cardiology

## 2023-11-18 DIAGNOSIS — I10 Essential (primary) hypertension: Secondary | ICD-10-CM

## 2023-12-31 ENCOUNTER — Ambulatory Visit: Admitting: Endocrinology

## 2023-12-31 ENCOUNTER — Ambulatory Visit: Payer: Self-pay | Admitting: Endocrinology

## 2023-12-31 ENCOUNTER — Encounter: Payer: Self-pay | Admitting: Endocrinology

## 2023-12-31 VITALS — BP 128/82 | HR 52 | Ht 72.0 in | Wt 348.4 lb

## 2023-12-31 DIAGNOSIS — E1165 Type 2 diabetes mellitus with hyperglycemia: Secondary | ICD-10-CM

## 2023-12-31 DIAGNOSIS — Z794 Long term (current) use of insulin: Secondary | ICD-10-CM | POA: Diagnosis not present

## 2023-12-31 LAB — POCT GLYCOSYLATED HEMOGLOBIN (HGB A1C): Hemoglobin A1C: 8.2 % — AB (ref 4.0–5.6)

## 2023-12-31 MED ORDER — INSULIN LISPRO PROT & LISPRO (75-25 MIX) 100 UNIT/ML KWIKPEN: PEN_INJECTOR | SUBCUTANEOUS | 4 refills | Status: AC

## 2023-12-31 NOTE — Progress Notes (Signed)
 Outpatient Endocrinology Note [place] Timber Lucarelli, MD   Patient's Name: William Cameron    DOB: 04/12/1950    MRN: [id number]                                                    REASON OF VISIT: Follow up for type 2 diabetes mellitus  PCP: Valma Carwin, MD  HISTORY OF PRESENT ILLNESS:   Donal E Traweek is a 73 y.o. old male with past medical history listed below, is here for follow up for type 2 diabetes mellitus.   Pertinent Diabetes History: Patient was previously seen by Dr. Von and was last time seen in July 2024.  Patient has uncontrolled type 2 diabetes mellitus.  Chronic Diabetes Complications : Retinopathy: no. Last ophthalmology exam was done on annually, following with ophthalmology regularly.  Nephropathy: no, on ACE/ARB /olmesartan . Peripheral neuropathy: ? Coronary artery disease: no Stroke: no  Relevant comorbidities and cardiovascular risk factors: Obesity: yes Body mass index is 47.25 kg/m.  Hypertension: Yes  Hyperlipidemia : Yes, not on statin : Managed by cardiology /PCP.  He had muscle and joint pains with Lipitor and also with Crestor  prescribed by PCP which he could not tolerate apparently. He had been on Repatha  previously    Previously has been told a couple of times to try fluvastatin  XL.  However he thinks he had some side effects which he does not remember and is not taking any medication. He was given a trial injection of Repatha  by the cardiologist but he thinks it caused him to have headaches and high blood pressure and refuses to consider any more medications. He is not taking the Zetia  that was recommended also  Current / Home Diabetic regimen includes:  Humalog  mix 75/25 : 10 units in the morning with breakfast and 20 units in the evening with supper.  Prior diabetic medications: Stop taking Ozempic  due to GI upset/nausea and headache. Had taken twice in the past he stopped due to nausea.  Farxiga  was stopped due to high cost in the past.  No detail  records of prior medication available to review.  Not sure about if he had taken metformin  in the past.  Previously has refused to take any medication for diabetes other than insulin  however he had taken Ozempic  from his cardiology in the past.  Glycemic data:    One Touch Verio flex glucometer download from September 22 to December 31, 2023, average blood sugar 142.  He has been taking 1-2 times a day.  Fasting blood sugar 119, 125, 168, 159.  Blood sugar in the afternoon 135, 150, 140, 131, 132.   Hypoglycemia: Patient has denies hypoglycemic episodes. Patient has hypoglycemia awareness.  Factors modifying glucose control: 1.  Diabetic diet assessment: 3 meals a day.  2.  Staying active or exercising: No formal exercise.  3.  Medication compliance: compliant most of the time.  Interval history  Hemoglobin A1c 8.2%.  Diabetes regimen as reviewed above.  Blood sugar on glucometer data mostly acceptable however he is still having uncontrolled diabetes mellitus.  He reports he had almost 8 weeks of birthdays in a row in the last 2 weeks eating cake regularly.  He otherwise feels good, no symptoms.  No numbness and ting of the feet or extremities.  No vision problem.  No other complaints today. He  has been taking insulin  after eating.   REVIEW OF SYSTEMS As per history of present illness.   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    Shakes    Sleep apnea    cpap at night    PAST SURGICAL HISTORY: Past Surgical  History:  Procedure Laterality Date   CARDIOVERSION N/A 01/25/2016   Procedure: CARDIOVERSION;  Surgeon: Gordy Bergamo, MD;  Location: [facility] ENDOSCOPY;  Service: Cardiovascular;  Laterality: N/A;   COLONOSCOPY  02/02/2012   Procedure: COLONOSCOPY;  Surgeon: Belvie JONETTA Just, MD;  Location: [facility] ENDOSCOPY;  Service: Endoscopy;  Laterality: N/A;   HERNIA REPAIR     HYDROCELE EXCISION / REPAIR     I & D EXTREMITY Left 11/24/2017   Procedure: IRRIGATION AND DEBRIDEMENT LEFT FOOT, REMOVAL OF TWO SCREWS, INSERTION OF ANTIBIOTIC BEADS;  Surgeon: Janit Thresa HERO, DPM;  Location: [facility] OR;  Service: Podiatry;  Laterality: Left;   JOINT REPLACEMENT     rt knee   ROTATOR CUFF REPAIR  rt   TOTAL HIP ARTHROPLASTY Right 07/14/2014   Procedure: RIGHT TOTAL HIP ARTHROPLASTY;  Surgeon: Donnice Car, MD;  Location: [facility] ORS;  Service: Orthopedics;  Laterality: Right;    ALLERGIES: Allergies  Allergen Reactions   Penicillins Anaphylaxis    Has patient had a PCN reaction causing immediate rash, facial/tongue/throat swelling, SOB or lightheadedness with hypotension: Yes Has patient had a PCN reaction causing severe rash involving mucus membranes or skin necrosis: Yes Has patient had a PCN reaction that required hospitalization Yes Has patient had a PCN reaction occurring within the last 10 years: No If all of the above answers are NO, then may proceed with Cephalosporin use.   Tetanus Toxoid-Containing Vaccines Anaphylaxis, Shortness Of Breath and Swelling   Sulfa Antibiotics Nausea And Vomiting and Swelling   Statins    Trulicity [Dulaglutide]     nausea    FAMILY HISTORY:  Family History  Problem Relation Age of Onset   Diabetes type II Mother    Stroke Mother 8   High Cholesterol Mother    Prostate cancer Father 68    SOCIAL HISTORY: Social History   Socioeconomic History   Marital status: Widowed    Spouse name: Joen   Number of children: 1   Years of education: 12+   Highest education level: Not  on file  Occupational History    Employer: [employer]  Tobacco Use   Smoking status: Never   Smokeless tobacco: Never  Vaping Use   Vaping status: Never Used  Substance and Sexual Activity   Alcohol use: No   Drug use: No   Sexual activity: Never  Other Topics Concern   Not on file  Social History Narrative   Patient is married [place]) and lives at home with his wife.   Patient has one adult child.   Patient is working full-time.   Patient has a high school and auto classes.   Patient is right-handed.  Patient drinks two cups of coffee every morning and very little tea.   Social Drivers of Corporate investment banker Strain: Not on file  Food Insecurity: Not on file  Transportation Needs: Not on file  Physical Activity: Not on file  Stress: Not on file  Social Connections: Not on file    MEDICATIONS:  Current Outpatient Medications  Medication Sig Dispense Refill   apixaban  (ELIQUIS ) 5 MG TABS tablet Take 5 mg by mouth 2 (two) times daily.     metoprolol  succinate (TOPROL -XL) 25 MG 24 hr tablet Take 1 tablet (25 mg total) by mouth daily.     olmesartan -hydrochlorothiazide (BENICAR  HCT) 40-25 MG tablet TAKE 1 TABLET BY MOUTH ONCE DAILY IN THE MORNING 90 tablet 1   ONETOUCH VERIO test strip CHECK BLOOD GLUCOSE TWICE DAILY 200 strip 2   Insulin  Lispro Prot & Lispro (HUMALOG  MIX 75/25 KWIKPEN) (75-25) 100 UNIT/ML Kwikpen INJECT 12 UNITS SUBCUTANEOUSLY IN THE MORNING AND 20 BEFORE SUPPER 30 mL 4   No current facility-administered medications for this visit.    PHYSICAL EXAM: Vitals:   12/31/23 0841  BP: 128/82  Pulse: (!) 52  Weight: (!) 348 lb 6.4 oz (158 kg)  Height: 6' (1.829 m)     Body mass index is 47.25 kg/m.  Wt Readings from Last 3 Encounters:  12/31/23 (!) 348 lb 6.4 oz (158 kg)  06/27/23 (!) 336 lb 9.6 oz (152.7 kg)  05/24/23 (!) 333 lb 12.8 oz (151.4 kg)    General: Well developed, well nourished male in no apparent distress.  HEENT: AT/[place],  no external lesions.  Eyes: Conjunctiva clear and no icterus. Neck: Neck supple  Lungs: Respirations not labored Neurologic: Alert, oriented, normal speech Extremities / Skin: Dry.  Psychiatric: Does not appear depressed or anxious  Diabetic Foot Exam - Simple   No data filed     Diabetic Foot Examination : Patient reports being performed by primary care provider,  annually.  LABS Reviewed Lab Results  Component Value Date   HGBA1C 8.2 (A) 12/31/2023   HGBA1C 8.0 (A) 09/19/2023   HGBA1C 7.6 (A) 06/27/2023   No results found for: FRUCTOSAMINE Lab Results  Component Value Date   CHOL 205 (H) 08/03/2021   HDL 41.40 08/03/2021   LDLCALC 142 (H) 08/03/2021   TRIG 111.0 08/03/2021   CHOLHDL 5 08/03/2021   Lab Results  Component Value Date   MICRALBCREAT 19 08/15/2023   Lab Results  Component Value Date   CREATININE 1.3 03/02/2022   Lab Results  Component Value Date   GFR 48.98 (L) 08/03/2021    ASSESSMENT / PLAN  1. Uncontrolled type 2 diabetes mellitus with hyperglycemia, with long-term current use of insulin  (HCC)    Diabetes Mellitus type 2, complicated by no other known complications. - Diabetic status / severity: Uncontrolled.  Lab Results  Component Value Date   HGBA1C 8.2 (A) 12/31/2023    - Hemoglobin A1c goal : <6.5%  Diabetes control improving.  Glucometer data mostly acceptable.  Patient is asked to take insulin  before eating.  He is currently taking after eating.  Discussed about limiting and avoiding high carbohydrate meal.   - Medications: See below.  No change.  I) increase Humalog  mix 75/25 : From 10 units to 12 units with breakfast and continue 20 units with supper.  Asked to take before meals.  - Home glucose testing: Before breakfast, supper and at bedtime.   - Discussed/ Gave Hypoglycemia treatment plan.  # Consult :  not required at this time.   # Annual urine for microalbuminuria/ creatinine ratio, no microalbuminuria currently,  continue ACE/ARB /olmesartan .   Lab Results  Component Value Date   MICRALBCREAT 19 08/15/2023    # Foot check nightly.  # Annual dilated diabetic eye exams.   - Diet: Make healthy diabetic food choices - Life style / activity / exercise: Discussed.  2. Blood pressure  -  BP Readings from Last 1 Encounters:  12/31/23 128/82    - Control is in target.  - No change in current plans.  3. Lipid status / Hyperlipidemia - Last  Lab Results  Component Value Date   LDLCALC 142 (H) 08/03/2021   -Not on a statin.  Detail in HPI.  Managed by PCP/cardiology.   Diagnoses and all orders for this visit:  Uncontrolled type 2 diabetes mellitus with hyperglycemia, with long-term current use of insulin  (HCC) -     POCT glycosylated hemoglobin (Hb A1C) -     Insulin  Lispro Prot & Lispro (HUMALOG  MIX 75/25 KWIKPEN) (75-25) 100 UNIT/ML Kwikpen; INJECT 12 UNITS SUBCUTANEOUSLY IN THE MORNING AND 20 BEFORE SUPPER    DISPOSITION Follow up in clinic in 3  months suggested.   All questions answered and patient verbalized understanding of the plan.  [place] Aydan Levitz, MD [facility] Endocrinology [facility] [address] [place], [address] [place], [place] [address] Phone # [phone]  At least part of this note was generated using voice recognition software. Inadvertent word errors may have occurred, which were not recognized during the proofreading process.

## 2023-12-31 NOTE — Patient Instructions (Signed)
 Humalog  75/25 insulin  take 12 units with morning meal and 20 unit with evening meal, take before eating.

## 2024-01-22 ENCOUNTER — Other Ambulatory Visit: Payer: Self-pay | Admitting: Internal Medicine

## 2024-01-22 DIAGNOSIS — N1832 Chronic kidney disease, stage 3b: Secondary | ICD-10-CM

## 2024-01-23 ENCOUNTER — Inpatient Hospital Stay: Admission: RE | Admit: 2024-01-23 | Discharge: 2024-01-23 | Attending: Internal Medicine | Admitting: Internal Medicine

## 2024-01-23 DIAGNOSIS — N1832 Chronic kidney disease, stage 3b: Secondary | ICD-10-CM

## 2024-04-02 ENCOUNTER — Ambulatory Visit: Admitting: Endocrinology

## 2024-04-10 LAB — LAB REPORT - SCANNED
A1c: 9.1
EGFR: 42

## 2024-05-22 ENCOUNTER — Ambulatory Visit: Admitting: Endocrinology
